# Patient Record
Sex: Female | Born: 1960 | Race: Black or African American | Hispanic: No | Marital: Married | Smoking: Former smoker
Health system: Southern US, Community
[De-identification: ages and names within clinical notes are randomized; demographics above are authoritative.]

## PROBLEM LIST (undated history)

## (undated) DIAGNOSIS — G709 Myoneural disorder, unspecified: Secondary | ICD-10-CM

## (undated) DIAGNOSIS — F319 Bipolar disorder, unspecified: Secondary | ICD-10-CM

## (undated) DIAGNOSIS — F32A Depression, unspecified: Secondary | ICD-10-CM

## (undated) DIAGNOSIS — F419 Anxiety disorder, unspecified: Secondary | ICD-10-CM

## (undated) DIAGNOSIS — I10 Essential (primary) hypertension: Secondary | ICD-10-CM

## (undated) DIAGNOSIS — F329 Major depressive disorder, single episode, unspecified: Secondary | ICD-10-CM

## (undated) HISTORY — PX: ABDOMINAL HYSTERECTOMY: SHX81

## (undated) HISTORY — PX: KNEE SURGERY: SHX244

## (undated) HISTORY — PX: BREAST SURGERY: SHX581

## (undated) HISTORY — PX: OVARIAN CYST REMOVAL: SHX89

## (undated) HISTORY — PX: FOOT SURGERY: SHX648

## (undated) HISTORY — PX: HAND TENDON SURGERY: SHX663

## (undated) HISTORY — PX: ECTOPIC PREGNANCY SURGERY: SHX613

---

## 2011-06-26 DIAGNOSIS — F418 Other specified anxiety disorders: Secondary | ICD-10-CM | POA: Insufficient documentation

## 2011-06-26 DIAGNOSIS — D179 Benign lipomatous neoplasm, unspecified: Secondary | ICD-10-CM | POA: Insufficient documentation

## 2012-04-22 DIAGNOSIS — N6320 Unspecified lump in the left breast, unspecified quadrant: Secondary | ICD-10-CM | POA: Insufficient documentation

## 2013-09-29 DIAGNOSIS — E669 Obesity, unspecified: Secondary | ICD-10-CM | POA: Insufficient documentation

## 2013-11-23 DIAGNOSIS — G5601 Carpal tunnel syndrome, right upper limb: Secondary | ICD-10-CM | POA: Insufficient documentation

## 2013-12-22 DIAGNOSIS — M51369 Other intervertebral disc degeneration, lumbar region without mention of lumbar back pain or lower extremity pain: Secondary | ICD-10-CM | POA: Insufficient documentation

## 2013-12-22 DIAGNOSIS — M5136 Other intervertebral disc degeneration, lumbar region: Secondary | ICD-10-CM | POA: Insufficient documentation

## 2014-05-03 DIAGNOSIS — K219 Gastro-esophageal reflux disease without esophagitis: Secondary | ICD-10-CM | POA: Insufficient documentation

## 2014-06-21 DIAGNOSIS — M65331 Trigger finger, right middle finger: Secondary | ICD-10-CM | POA: Insufficient documentation

## 2014-12-01 DIAGNOSIS — J301 Allergic rhinitis due to pollen: Secondary | ICD-10-CM | POA: Insufficient documentation

## 2015-01-09 DIAGNOSIS — F3170 Bipolar disorder, currently in remission, most recent episode unspecified: Secondary | ICD-10-CM | POA: Insufficient documentation

## 2016-05-13 DIAGNOSIS — G894 Chronic pain syndrome: Secondary | ICD-10-CM | POA: Insufficient documentation

## 2016-07-16 DIAGNOSIS — IMO0001 Reserved for inherently not codable concepts without codable children: Secondary | ICD-10-CM | POA: Insufficient documentation

## 2016-07-16 DIAGNOSIS — R911 Solitary pulmonary nodule: Secondary | ICD-10-CM | POA: Insufficient documentation

## 2016-11-21 DIAGNOSIS — F1910 Other psychoactive substance abuse, uncomplicated: Secondary | ICD-10-CM | POA: Insufficient documentation

## 2017-05-28 DIAGNOSIS — G4700 Insomnia, unspecified: Secondary | ICD-10-CM | POA: Insufficient documentation

## 2017-05-29 DIAGNOSIS — J302 Other seasonal allergic rhinitis: Secondary | ICD-10-CM | POA: Insufficient documentation

## 2017-06-11 DIAGNOSIS — K582 Mixed irritable bowel syndrome: Secondary | ICD-10-CM | POA: Insufficient documentation

## 2017-12-20 ENCOUNTER — Other Ambulatory Visit: Payer: Self-pay

## 2017-12-20 ENCOUNTER — Emergency Department
Admission: EM | Admit: 2017-12-20 | Discharge: 2017-12-20 | Disposition: A | Discharge status: Home / Self Care | Payer: Managed Care, Other (non HMO) | Attending: Emergency Medicine | Admitting: Emergency Medicine

## 2017-12-20 ENCOUNTER — Encounter: Payer: Self-pay | Admitting: Emergency Medicine

## 2017-12-20 DIAGNOSIS — S161XXA Strain of muscle, fascia and tendon at neck level, initial encounter: Secondary | ICD-10-CM

## 2017-12-20 DIAGNOSIS — Z79899 Other long term (current) drug therapy: Secondary | ICD-10-CM | POA: Diagnosis not present

## 2017-12-20 DIAGNOSIS — Y999 Unspecified external cause status: Secondary | ICD-10-CM | POA: Insufficient documentation

## 2017-12-20 DIAGNOSIS — R300 Dysuria: Secondary | ICD-10-CM

## 2017-12-20 DIAGNOSIS — M5431 Sciatica, right side: Secondary | ICD-10-CM

## 2017-12-20 DIAGNOSIS — Y92410 Unspecified street and highway as the place of occurrence of the external cause: Secondary | ICD-10-CM | POA: Diagnosis not present

## 2017-12-20 DIAGNOSIS — Y9389 Activity, other specified: Secondary | ICD-10-CM | POA: Diagnosis not present

## 2017-12-20 DIAGNOSIS — S199XXA Unspecified injury of neck, initial encounter: Secondary | ICD-10-CM | POA: Diagnosis present

## 2017-12-20 LAB — URINALYSIS, COMPLETE (UACMP) WITH MICROSCOPIC
BILIRUBIN URINE: NEGATIVE
GLUCOSE, UA: NEGATIVE mg/dL
Hgb urine dipstick: NEGATIVE
Ketones, ur: NEGATIVE mg/dL
LEUKOCYTES UA: NEGATIVE
NITRITE: POSITIVE — AB
Protein, ur: NEGATIVE mg/dL
SPECIFIC GRAVITY, URINE: 1.008 (ref 1.005–1.030)
pH: 6 (ref 5.0–8.0)

## 2017-12-20 LAB — WET PREP, GENITAL
Clue Cells Wet Prep HPF POC: NONE SEEN
SPERM: NONE SEEN
TRICH WET PREP: NONE SEEN
YEAST WET PREP: NONE SEEN

## 2017-12-20 LAB — CHLAMYDIA/NGC RT PCR (ARMC ONLY)
Chlamydia Tr: NOT DETECTED
N gonorrhoeae: NOT DETECTED

## 2017-12-20 LAB — POCT PREGNANCY, URINE: Preg Test, Ur: NEGATIVE

## 2017-12-20 MED ORDER — MELOXICAM 15 MG PO TABS: 15.0000 mg | ORAL_TABLET | Freq: Every day | ORAL | 0 refills | Status: DC

## 2017-12-20 MED ORDER — CYCLOBENZAPRINE HCL 10 MG PO TABS
10.0000 mg | ORAL_TABLET | Freq: Once | ORAL | Status: AC
  Administered 2017-12-20: 10 mg via ORAL
  Filled 2017-12-20: qty 1

## 2017-12-20 MED ORDER — CEPHALEXIN 500 MG PO CAPS: 500.0000 mg | ORAL_CAPSULE | Freq: Four times a day (QID) | ORAL | 0 refills | Status: AC

## 2017-12-20 MED ORDER — CYCLOBENZAPRINE HCL 5 MG PO TABS: 5.0000 mg | ORAL_TABLET | Freq: Three times a day (TID) | ORAL | 0 refills | Status: DC | PRN

## 2017-12-20 MED ORDER — SULFAMETHOXAZOLE-TRIMETHOPRIM 800-160 MG PO TABS: 1.0000 | ORAL_TABLET | Freq: Two times a day (BID) | ORAL | 0 refills | Status: DC

## 2017-12-20 MED ORDER — KETOROLAC TROMETHAMINE 30 MG/ML IJ SOLN
30.0000 mg | Freq: Once | INTRAMUSCULAR | Status: AC
  Administered 2017-12-20: 30 mg via INTRAMUSCULAR
  Filled 2017-12-20: qty 1
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
## 2017-12-20 NOTE — Discharge Instructions (Signed)
Please take medication as prescribed.  If any fevers, back pain, difficulty urinating, worsening symptoms or to changes in your health please return to the emergency department.
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
## 2017-12-22 LAB — URINE CULTURE: Culture: >=100000 — AB

## 2018-02-02 ENCOUNTER — Emergency Department
Admission: EM | Admit: 2018-02-02 | Discharge: 2018-02-02 | Disposition: A | Discharge status: Home / Self Care | Payer: 59 | Attending: Emergency Medicine | Admitting: Emergency Medicine

## 2018-02-02 ENCOUNTER — Encounter: Payer: Self-pay | Admitting: Emergency Medicine

## 2018-02-02 ENCOUNTER — Other Ambulatory Visit: Payer: Self-pay

## 2018-02-02 ENCOUNTER — Emergency Department: Payer: 59

## 2018-02-02 DIAGNOSIS — R1030 Lower abdominal pain, unspecified: Secondary | ICD-10-CM | POA: Diagnosis present

## 2018-02-02 DIAGNOSIS — Z79899 Other long term (current) drug therapy: Secondary | ICD-10-CM | POA: Diagnosis not present

## 2018-02-02 DIAGNOSIS — R197 Diarrhea, unspecified: Secondary | ICD-10-CM | POA: Insufficient documentation

## 2018-02-02 DIAGNOSIS — N3090 Cystitis, unspecified without hematuria: Secondary | ICD-10-CM | POA: Insufficient documentation

## 2018-02-02 LAB — CBC
HCT: 41.2 % (ref 36.0–46.0)
Hemoglobin: 13 g/dL (ref 12.0–15.0)
MCH: 28.3 pg (ref 26.0–34.0)
MCHC: 31.6 g/dL (ref 30.0–36.0)
MCV: 89.6 fL (ref 80.0–100.0)
PLATELETS: 329 10*3/uL (ref 150–400)
RBC: 4.6 MIL/uL (ref 3.87–5.11)
RDW: 12.9 % (ref 11.5–15.5)
WBC: 7.4 10*3/uL (ref 4.0–10.5)
nRBC: 0 % (ref 0.0–0.2)

## 2018-02-02 LAB — COMPREHENSIVE METABOLIC PANEL
ALBUMIN: 4.1 g/dL (ref 3.5–5.0)
ALT: 12 U/L (ref 0–44)
AST: 16 U/L (ref 15–41)
Alkaline Phosphatase: 64 U/L (ref 38–126)
Anion gap: 5 (ref 5–15)
BILIRUBIN TOTAL: 0.7 mg/dL (ref 0.3–1.2)
BUN: 11 mg/dL (ref 6–20)
CALCIUM: 9.5 mg/dL (ref 8.9–10.3)
CO2: 27 mmol/L (ref 22–32)
CREATININE: 1.08 mg/dL — AB (ref 0.44–1.00)
Chloride: 109 mmol/L (ref 98–111)
GFR calc Af Amer: >60 mL/min (ref >60)
GFR calc non Af Amer: 56 mL/min — ABNORMAL LOW (ref >60)
GLUCOSE: 109 mg/dL — AB (ref 70–99)
Potassium: 3.9 mmol/L (ref 3.5–5.1)
Sodium: 141 mmol/L (ref 135–145)
TOTAL PROTEIN: 7.4 g/dL (ref 6.5–8.1)

## 2018-02-02 LAB — GASTROINTESTINAL PANEL BY PCR, STOOL (REPLACES STOOL CULTURE)

## 2018-02-02 LAB — C DIFFICILE QUICK SCREEN W PCR REFLEX
C DIFFICILE (CDIFF) TOXIN: NEGATIVE
C Diff antigen: NEGATIVE
C Diff interpretation: NOT DETECTED

## 2018-02-02 LAB — URINALYSIS, COMPLETE (UACMP) WITH MICROSCOPIC
BILIRUBIN URINE: NEGATIVE
Bacteria, UA: NONE SEEN
Glucose, UA: NEGATIVE mg/dL
Hgb urine dipstick: NEGATIVE
KETONES UR: NEGATIVE mg/dL
NITRITE: NEGATIVE
PH: 8 (ref 5.0–8.0)
Protein, ur: NEGATIVE mg/dL
Specific Gravity, Urine: 1.009 (ref 1.005–1.030)

## 2018-02-02 LAB — LIPASE, BLOOD: Lipase: 31 U/L (ref 11–51)

## 2018-02-02 MED ORDER — LIDOCAINE-HYDROCORTISONE ACE 3-0.5 % RE CREA: 1.0000 | TOPICAL_CREAM | Freq: Two times a day (BID) | RECTAL | 0 refills | Status: DC

## 2018-02-02 MED ORDER — MORPHINE SULFATE (PF) 4 MG/ML IV SOLN
4.0000 mg | Freq: Once | INTRAVENOUS | Status: AC
  Administered 2018-02-02: 4 mg via INTRAVENOUS
  Filled 2018-02-02: qty 1

## 2018-02-02 MED ORDER — ONDANSETRON HCL 4 MG/2ML IJ SOLN
4.0000 mg | Freq: Once | INTRAMUSCULAR | Status: AC
  Administered 2018-02-02: 4 mg via INTRAVENOUS
  Filled 2018-02-02: qty 2

## 2018-02-02 MED ORDER — IOPAMIDOL (ISOVUE-300) INJECTION 61%
15.0000 mL | INTRAVENOUS | Status: AC
  Administered 2018-02-02 (×2): 15 mL via ORAL

## 2018-02-02 MED ORDER — KETOROLAC TROMETHAMINE 30 MG/ML IJ SOLN
30.0000 mg | Freq: Once | INTRAMUSCULAR | Status: AC
  Administered 2018-02-02: 30 mg via INTRAVENOUS
  Filled 2018-02-02: qty 1

## 2018-02-02 MED ORDER — IOPAMIDOL (ISOVUE-300) INJECTION 61%
100.0000 mL | Freq: Once | INTRAVENOUS | Status: AC | PRN
  Administered 2018-02-02: 100 mL via INTRAVENOUS

## 2018-02-02 MED ORDER — TRAMADOL HCL 50 MG PO TABS: 50.0000 mg | ORAL_TABLET | Freq: Four times a day (QID) | ORAL | 0 refills | Status: AC | PRN

## 2018-02-02 NOTE — ED Triage Notes (Signed)
Being treated for uti for a month.  Says her doctor in [place] called and told her she has ecoli.  Says she has been on amoxicillin and then on cipro and prior to that we had put her on cefalexin.  Says she feels bad all over.

## 2018-02-02 NOTE — ED Notes (Signed)
Dr. Cherylann Banas in room to assess patient.  Will continue to monitor.

## 2018-02-02 NOTE — ED Provider Notes (Signed)
[facility] Emergency Department Provider Note ____________________________________________   First MD Initiated Contact with Patient 02/02/18 1033     (approximate)  I have reviewed the triage vital signs and the nursing notes.   HISTORY  Chief Complaint Abdominal Pain; Back Pain; and Leg Pain    HPI Jennifer Welch is a 57 y.o. female with PMH as noted below who presents with a primary complaint of lower abdominal pain, occurring over the last several weeks, going across her whole lower abdomen and radiating to her back into her legs, and associated with diarrhea.  The patient also has been having dysuria and has been treated for an apparent UTI with several courses of antibiotics.  She was told that she had E. coli.  She is now concerned that it could be in the rest of her body or in her blood.  She denies fever, nausea or vomiting, chest pain or shortness of breath, or other acute symptoms.  She denies blood in the stool.   History reviewed. No pertinent past medical history.  There are no active problems to display for this patient.   Past Surgical History:  Procedure Laterality Date  . ABDOMINAL HYSTERECTOMY    . BREAST SURGERY    . ECTOPIC PREGNANCY SURGERY    . FOOT SURGERY    . HAND TENDON SURGERY    . KNEE SURGERY    . OVARIAN CYST REMOVAL      Prior to Admission medications   Medication Sig Start Date End Date Taking? Authorizing Provider  cetirizine (ZYRTEC) 10 MG tablet Take 10 mg by mouth daily.    [provider]  clonazePAM (KLONOPIN) 0.5 MG tablet Take 0.5 mg by mouth 3 (three) times daily as needed for anxiety.    [provider]  cyclobenzaprine (FLEXERIL) 5 MG tablet Take 1-2 tablets (5-10 mg total) by mouth 3 (three) times daily as needed for muscle spasms. 12/20/17   Duanne Guess, PA-C  escitalopram (LEXAPRO) 20 MG tablet Take 20 mg by mouth daily.    [provider]  fluticasone (FLONASE) 50  MCG/ACT nasal spray Place 1 spray into both nostrils daily.    [provider]  lidocaine-hydrocortisone (ANAMANTEL HC) 3-0.5 % CREA Place 1 Applicatorful rectally 2 (two) times daily. 02/02/18   Arta Silence, MD  meloxicam (MOBIC) 15 MG tablet Take 1 tablet (15 mg total) by mouth daily. 12/20/17   Duanne Guess, PA-C  OXcarbazepine (TRILEPTAL) 150 MG tablet Take 150 mg by mouth 2 (two) times daily.    [provider]  pantoprazole (PROTONIX) 40 MG tablet Take 40 mg by mouth daily.    [provider]  pregabalin (LYRICA) 150 MG capsule Take 150 mg by mouth 2 (two) times daily.    [provider]  sulfamethoxazole-trimethoprim (BACTRIM DS,SEPTRA DS) 800-160 MG tablet Take 1 tablet by mouth 2 (two) times daily. 12/20/17   Duanne Guess, PA-C  traMADol (ULTRAM) 50 MG tablet Take 1 tablet (50 mg total) by mouth every 6 (six) hours as needed for up to 5 days for moderate pain. 02/02/18 02/07/18  Arta Silence, MD    Allergies Macrobid [nitrofurantoin macrocrystal] and Septra [sulfamethoxazole-trimethoprim]  No family history on file.  Social History Social History   Tobacco Use  . Smoking status: Never Smoker  . Smokeless tobacco: Never Used  Substance Use Topics  . Alcohol use: Not Currently  . Drug use: Not on file    Review of Systems  Constitutional:  No fever. Eyes: No redness. ENT: No sore throat. Cardiovascular: Denies chest pain. Respiratory: Denies shortness of breath. Gastrointestinal: No vomiting.  Positive for diarrhea.  Genitourinary: Positive for dysuria.  Musculoskeletal: Negative for back pain. Skin: Negative for rash. Neurological: Negative for headache.   ____________________________________________   PHYSICAL EXAM:  VITAL SIGNS: ED Triage Vitals  Enc Vitals Group     BP 02/02/18 0929 118/73     Pulse Rate 02/02/18 0929 77     Resp 02/02/18 0929 16     Temp 02/02/18 0929 98.3 F (36.8 C)     Temp  Source 02/02/18 0929 Oral     SpO2 02/02/18 0929 98 %     Weight 02/02/18 0930 194 lb (88 kg)     Height 02/02/18 0930 5\' 7"  (1.702 m)     Head Circumference --      Peak Flow --      Pain Score 02/02/18 0930 10     Pain Loc --      Pain Edu? --      Excl. in [employer]? --     Constitutional: Alert and oriented.  Relatively well appearing and in no acute distress. Eyes: Conjunctivae are normal.  No scleral icterus. Head: Atraumatic. Nose: No congestion/rhinnorhea. Mouth/Throat: Mucous membranes are moist.   Neck: Normal range of motion.  Cardiovascular: Good peripheral circulation. Respiratory: Normal respiratory effort.  Gastrointestinal: Soft with mild bilateral lower quadrant abdominal tenderness. No distention.  Small external hemorrhoids with no bleeding. Genitourinary: No flank tenderness. Musculoskeletal: No lower extremity edema.  Extremities warm and well perfused.  Neurologic:  Normal speech and language. No gross focal neurologic deficits are appreciated.  Skin:  Skin is warm and dry. No rash noted. Psychiatric: Mood and affect are normal. Speech and behavior are normal.  ____________________________________________   LABS (all labs ordered are listed, but only abnormal results are displayed)  Labs Reviewed  COMPREHENSIVE METABOLIC PANEL - Abnormal; Notable for the following components:      Result Value   Glucose, Bld 109 (*)    Creatinine, Ser 1.08 (*)    GFR calc non Af Amer 56 (*)    All other components within normal limits  URINALYSIS, COMPLETE (UACMP) WITH MICROSCOPIC - Abnormal; Notable for the following components:   Color, Urine STRAW (*)    APPearance CLEAR (*)    Leukocytes, UA TRACE (*)    All other components within normal limits  C DIFFICILE QUICK SCREEN W PCR REFLEX  GASTROINTESTINAL PANEL BY PCR, STOOL (REPLACES STOOL CULTURE)  LIPASE, BLOOD  CBC    ____________________________________________  EKG   ____________________________________________  RADIOLOGY  CT abdomen: No colitis or other acute abnormalities  ____________________________________________   PROCEDURES  Procedure(s) performed: No  Procedures  Critical Care performed: No ____________________________________________   INITIAL IMPRESSION / ASSESSMENT AND PLAN / ED COURSE  Pertinent labs & imaging results that were available during my care of the patient were reviewed by me and considered in my medical decision making (see chart for details).  57 year old female with PMH as noted above presents with multiple complaints, but primarily for lower abdominal pain which radiates to her lower back and legs, diarrhea, and some dysuria.  The patient was seen in the ED on 12/20/2017 and initially diagnosed with UTI and sciatica.  She was started on Bactrim at that time.  She states she subsequently followed up with her doctor with continued symptoms and was placed on amoxicillin, but then once the cultures returned she was  changed to Cipro but 3 days ago.  She states that the dysuria has improved but the pain is persistent.  On exam she is well-appearing.  Her vital signs are normal.  The remainder of the exam is as described above, with some mild lower abdominal tenderness.  Overall her presentation is more consistent with colitis or enteritis than persistent urinary tract disease.  Her initial labs are all within normal limits and there is no evidence of systemic infection or sepsis.  Given the diarrhea, recent multiple antibiotic courses and abdominal tenderness, I will obtain C. difficile and GI panel from the stool, and CT abdomen to rule out colitis.  I anticipate discharge home if the work-up is negative.  ----------------------------------------- 3:38 PM on 02/02/2018 -----------------------------------------  The lab work-up is unremarkable.  The UA is  essentially negative, and I suspect that the patient's UTI is resolving with the Cipro she is taking currently.  Stool studies and C. difficile are also both negative.  CT showed no colitis or other acute findings.  There were very small pulmonary nodules but the patient is low risk and has no history of primary disease so she does not need any specific follow-up per the radiology recommendations.  I counseled the patient on the results of the work-up.  I suspect that she is either having pain related to the resolving cystitis, or she is having diarrhea and cramping related to the antibiotics she has been on.  She is stable for discharge at this time.  Return precautions given, and she expresses understanding.  I instructed her to continue the Cipro and finish the course, and I prescribed her tramadol for pain as well as lidocaine for her hemorrhoids. ____________________________________________   FINAL CLINICAL IMPRESSION(S) / ED DIAGNOSES  Final diagnoses:  Cystitis  Diarrhea, unspecified type      NEW MEDICATIONS STARTED DURING THIS VISIT:  New Prescriptions   LIDOCAINE-HYDROCORTISONE (ANAMANTEL HC) 3-0.5 % CREA    Place 1 Applicatorful rectally 2 (two) times daily.   TRAMADOL (ULTRAM) 50 MG TABLET    Take 1 tablet (50 mg total) by mouth every 6 (six) hours as needed for up to 5 days for moderate pain.     Note:  This document was prepared using Dragon voice recognition software and may include unintentional dictation errors.    Arta Silence, MD 02/02/18 [id number]

## 2018-02-02 NOTE — Discharge Instructions (Signed)
Continue the Cipro that you are already taking and finish the full course.  Follow-up with your doctor as instructed.  You can use the cream provided to relieve the hemorrhoid pain.  You can take the tramadol as needed for abdominal or back pain.  Return to the ER for new, worsening, persistent severe pain, weakness or numbness, vomiting, blood in the stool, fevers, or any other new or worsening symptoms that concern you.

## 2018-02-02 NOTE — ED Notes (Signed)
Patient states she has had pain to the posterior sides of her upper legs/thighs x 1 week.  Patient states she has had some pain x 1 month but the week has been severe.  Patient states she started a course of cipro on the 15th.  Patient is in no obvious distress at this time.  Patient eating lollipop during assessment.  Patient is requesting pain medication at this time.

## 2018-02-02 NOTE — ED Notes (Signed)
Patient to Room 11 via [facility], Vicente Males RN aware of room placement.

## 2018-03-06 ENCOUNTER — Emergency Department: Payer: 59

## 2018-03-06 ENCOUNTER — Encounter: Payer: Self-pay | Admitting: Emergency Medicine

## 2018-03-06 ENCOUNTER — Other Ambulatory Visit: Payer: Self-pay

## 2018-03-06 ENCOUNTER — Emergency Department
Admission: EM | Admit: 2018-03-06 | Discharge: 2018-03-06 | Disposition: A | Discharge status: Home / Self Care | Payer: 59 | Attending: Emergency Medicine | Admitting: Emergency Medicine

## 2018-03-06 DIAGNOSIS — Z79899 Other long term (current) drug therapy: Secondary | ICD-10-CM | POA: Insufficient documentation

## 2018-03-06 DIAGNOSIS — R350 Frequency of micturition: Secondary | ICD-10-CM | POA: Diagnosis present

## 2018-03-06 DIAGNOSIS — J069 Acute upper respiratory infection, unspecified: Secondary | ICD-10-CM | POA: Insufficient documentation

## 2018-03-06 LAB — COMPREHENSIVE METABOLIC PANEL
ALK PHOS: 54 U/L (ref 38–126)
ALT: 12 U/L (ref 0–44)
AST: 19 U/L (ref 15–41)
Albumin: 4.3 g/dL (ref 3.5–5.0)
Anion gap: 9 (ref 5–15)
BUN: 8 mg/dL (ref 6–20)
CALCIUM: 9.7 mg/dL (ref 8.9–10.3)
CO2: 28 mmol/L (ref 22–32)
CREATININE: 0.97 mg/dL (ref 0.44–1.00)
Chloride: 104 mmol/L (ref 98–111)
GFR calc non Af Amer: >60 mL/min (ref >60)
GLUCOSE: 91 mg/dL (ref 70–99)
Potassium: 3.4 mmol/L — ABNORMAL LOW (ref 3.5–5.1)
SODIUM: 141 mmol/L (ref 135–145)
Total Bilirubin: 0.8 mg/dL (ref 0.3–1.2)
Total Protein: 7.8 g/dL (ref 6.5–8.1)

## 2018-03-06 LAB — CBC
HCT: 38.1 % (ref 36.0–46.0)
HEMOGLOBIN: 12.2 g/dL (ref 12.0–15.0)
MCH: 28.3 pg (ref 26.0–34.0)
MCHC: 32 g/dL (ref 30.0–36.0)
MCV: 88.4 fL (ref 80.0–100.0)
Platelets: 296 10*3/uL (ref 150–400)
RBC: 4.31 MIL/uL (ref 3.87–5.11)
RDW: 13 % (ref 11.5–15.5)
WBC: 6 10*3/uL (ref 4.0–10.5)
nRBC: 0 % (ref 0.0–0.2)

## 2018-03-06 LAB — URINALYSIS, ROUTINE W REFLEX MICROSCOPIC
Bacteria, UA: NONE SEEN
Bilirubin Urine: NEGATIVE
GLUCOSE, UA: NEGATIVE mg/dL
Hgb urine dipstick: NEGATIVE
Ketones, ur: NEGATIVE mg/dL
Nitrite: NEGATIVE
PROTEIN: NEGATIVE mg/dL
Specific Gravity, Urine: 1.008 (ref 1.005–1.030)
pH: 6 (ref 5.0–8.0)

## 2018-03-06 MED ORDER — PHENAZOPYRIDINE HCL 200 MG PO TABS: 200.0000 mg | ORAL_TABLET | Freq: Three times a day (TID) | ORAL | 0 refills | Status: DC | PRN

## 2018-03-06 NOTE — ED Triage Notes (Signed)
Pt to ed with c/o urinary frequency burning and pain.  Pt states hx of frequent UTIs.  Pt also reports left lower abd pain in addition to decreased appetite, burning in vaginal area.  Pt also c/o cough and congestion x several days.

## 2018-03-06 NOTE — ED Provider Notes (Signed)
[facility] Emergency Department Provider Note  Time seen: 10:44 AM  I have reviewed the triage vital signs and the nursing notes.   HISTORY  Chief Complaint Urinary Frequency    HPI Analyah Welch is a 57 y.o. female who presents to the emergency department with complaints of dysuria, frequency left lower quadrant abdominal pain, cough and congestion.  According to the patient since October she has been experiencing intermittent left lower quadrant abdominal pain, intermittent dysuria and burning.  States she was diagnosed with urinary tract infection back in October was placed on antibiotics.  Felt like the urinary tract infection came back so she went to her doctor 11/28 and had a culture performed showing no infection.  Patient states recently she had sex with her husband and forgot to urinate afterwards and since then has been experiencing burning upon urination once again with continued intermittent lower abdominal pain.  She also states for the past several weeks she has been coughing with congestion although denies any fever.  Also feels like she could be dehydrated.   History reviewed. No pertinent past medical history.  There are no active problems to display for this patient.   Past Surgical History:  Procedure Laterality Date  . ABDOMINAL HYSTERECTOMY    . BREAST SURGERY    . ECTOPIC PREGNANCY SURGERY    . FOOT SURGERY    . HAND TENDON SURGERY    . KNEE SURGERY    . OVARIAN CYST REMOVAL      Prior to Admission medications   Medication Sig Start Date End Date Taking? Authorizing Provider  cetirizine (ZYRTEC) 10 MG tablet Take 10 mg by mouth daily.    [provider]  clonazePAM (KLONOPIN) 0.5 MG tablet Take 0.5 mg by mouth 3 (three) times daily as needed for anxiety.    [provider]  cyclobenzaprine (FLEXERIL) 5 MG tablet Take 1-2 tablets (5-10 mg total) by mouth 3 (three) times daily as needed for muscle spasms. 12/20/17    Duanne Guess, PA-C  escitalopram (LEXAPRO) 20 MG tablet Take 20 mg by mouth daily.    [provider]  fluticasone (FLONASE) 50 MCG/ACT nasal spray Place 1 spray into both nostrils daily.    [provider]  lidocaine-hydrocortisone (ANAMANTEL HC) 3-0.5 % CREA Place 1 Applicatorful rectally 2 (two) times daily. 02/02/18   Arta Silence, MD  meloxicam (MOBIC) 15 MG tablet Take 1 tablet (15 mg total) by mouth daily. 12/20/17   Duanne Guess, PA-C  OXcarbazepine (TRILEPTAL) 150 MG tablet Take 150 mg by mouth 2 (two) times daily.    [provider]  pantoprazole (PROTONIX) 40 MG tablet Take 40 mg by mouth daily.    [provider]  pregabalin (LYRICA) 150 MG capsule Take 150 mg by mouth 2 (two) times daily.    [provider]  sulfamethoxazole-trimethoprim (BACTRIM DS,SEPTRA DS) 800-160 MG tablet Take 1 tablet by mouth 2 (two) times daily. 12/20/17   Duanne Guess, PA-C    Allergies  Allergen Reactions  . Macrobid [Nitrofurantoin Macrocrystal] Rash  . Septra [Sulfamethoxazole-Trimethoprim] Rash    No family history on file.  Social History Social History   Tobacco Use  . Smoking status: Never Smoker  . Smokeless tobacco: Never Used  Substance Use Topics  . Alcohol use: Not Currently  . Drug use: Never    Review of Systems Constitutional: Negative for fever ENT: Positive for cough/congestion Cardiovascular: Negative for chest pain. Respiratory: Negative for shortness of  breath.  Positive for cough Gastrointestinal: Intermittent lower abdominal pain.  Negative for vomiting or diarrhea Genitourinary: Dysuria and frequency Musculoskeletal: Negative for musculoskeletal complaints Skin: Negative for skin complaints  Neurological: Negative for headache All other ROS negative  ____________________________________________   PHYSICAL EXAM:  VITAL SIGNS: ED Triage Vitals  Enc Vitals Group     BP 03/06/18 1012 (!) 143/75      Pulse Rate 03/06/18 1012 75     Resp 03/06/18 1012 18     Temp 03/06/18 1012 98.7 F (37.1 C)     Temp Source 03/06/18 1012 Oral     SpO2 03/06/18 1012 99 %     Weight 03/06/18 1013 186 lb (84.4 kg)     Height 03/06/18 1013 5\' 7"  (1.702 m)     Head Circumference --      Peak Flow --      Pain Score 03/06/18 1012 10     Pain Loc --      Pain Edu? --      Excl. in [place]? --     Constitutional: Alert and oriented. Well appearing and in no distress. Eyes: Normal exam ENT   Head: Normocephalic and atraumatic.   Mouth/Throat: Mucous membranes are moist.  Mild congestion. Cardiovascular: Normal rate, regular rhythm. No murmur Respiratory: Normal respiratory effort without tachypnea nor retractions. Breath sounds are clear  Gastrointestinal: Soft, minimal lower abdominal tenderness to palpation, no rebound guarding or distention Musculoskeletal: Nontender with normal range of motion in all extremities.  Neurologic:  Normal speech and language. No gross focal neurologic deficits Skin:  Skin is warm, dry and intact.  Psychiatric: Mood and affect are normal.   ____________________________________________   RADIOLOGY  Chest x-ray is negative  ____________________________________________   INITIAL IMPRESSION / ASSESSMENT AND PLAN / ED COURSE  Pertinent labs & imaging results that were available during my care of the patient were reviewed by me and considered in my medical decision making (see chart for details).  Patient presents emergency department for multiple complaints including cough congestion times several weeks also intermittent dysuria and frequency along with intermittent lower abdominal pain which has been ongoing since October.  Denies any fever.  Patient was seen in [place] diagnosed with urinary tract infection.  Also had a pelvic exam performed with negative STD testing.  Denies being at risk for STD sexually active with her husband only.  Overall the  patient appears very well, minimal suprapubic tenderness palpation otherwise benign abdomen.  Occasional cough with minimal congestion.  We will check labs and a chest x-ray as a precaution.  We will check a urinalysis and continue to closely monitor.  Chest x-ray is negative.  Labs are normal.  Urinalysis is negative.  Urine culture has been added on as a precaution.  We will discharge the patient home with PCP follow-up.  Patient agreeable to plan of care.  ____________________________________________   FINAL CLINICAL IMPRESSION(S) / ED DIAGNOSES  Upper respiratory infection    Harvest Dark, MD 03/06/18 1244

## 2018-03-06 NOTE — ED Notes (Signed)
Pt unable to provide urine sample at this time. Pt aware that urine sample is needed.

## 2018-03-07 LAB — URINE CULTURE

## 2018-04-06 ENCOUNTER — Other Ambulatory Visit: Payer: Self-pay

## 2018-04-06 ENCOUNTER — Emergency Department
Admission: EM | Admit: 2018-04-06 | Discharge: 2018-04-06 | Disposition: A | Discharge status: Home / Self Care | Payer: 59 | Attending: Emergency Medicine | Admitting: Emergency Medicine

## 2018-04-06 ENCOUNTER — Encounter: Payer: Self-pay | Admitting: *Deleted

## 2018-04-06 DIAGNOSIS — M545 Low back pain, unspecified: Secondary | ICD-10-CM

## 2018-04-06 DIAGNOSIS — N3090 Cystitis, unspecified without hematuria: Secondary | ICD-10-CM | POA: Insufficient documentation

## 2018-04-06 DIAGNOSIS — Z79899 Other long term (current) drug therapy: Secondary | ICD-10-CM | POA: Diagnosis not present

## 2018-04-06 DIAGNOSIS — R300 Dysuria: Secondary | ICD-10-CM | POA: Diagnosis present

## 2018-04-06 LAB — COMPREHENSIVE METABOLIC PANEL
ALBUMIN: 4.4 g/dL (ref 3.5–5.0)
ALK PHOS: 57 U/L (ref 38–126)
ALT: 12 U/L (ref 0–44)
AST: 17 U/L (ref 15–41)
Anion gap: 7 (ref 5–15)
BILIRUBIN TOTAL: 0.8 mg/dL (ref 0.3–1.2)
BUN: 11 mg/dL (ref 6–20)
CALCIUM: 9.4 mg/dL (ref 8.9–10.3)
CO2: 25 mmol/L (ref 22–32)
CREATININE: 0.85 mg/dL (ref 0.44–1.00)
Chloride: 107 mmol/L (ref 98–111)
GFR calc Af Amer: >60 mL/min (ref >60)
GFR calc non Af Amer: >60 mL/min (ref >60)
Glucose, Bld: 101 mg/dL — ABNORMAL HIGH (ref 70–99)
Potassium: 3.7 mmol/L (ref 3.5–5.1)
Sodium: 139 mmol/L (ref 135–145)
TOTAL PROTEIN: 7.6 g/dL (ref 6.5–8.1)

## 2018-04-06 LAB — CBC
HCT: 36.3 % (ref 36.0–46.0)
Hemoglobin: 11.8 g/dL — ABNORMAL LOW (ref 12.0–15.0)
MCH: 28.2 pg (ref 26.0–34.0)
MCHC: 32.5 g/dL (ref 30.0–36.0)
MCV: 86.6 fL (ref 80.0–100.0)
PLATELETS: 297 10*3/uL (ref 150–400)
RBC: 4.19 MIL/uL (ref 3.87–5.11)
RDW: 13.2 % (ref 11.5–15.5)
WBC: 6.4 10*3/uL (ref 4.0–10.5)
nRBC: 0 % (ref 0.0–0.2)

## 2018-04-06 LAB — URINALYSIS, COMPLETE (UACMP) WITH MICROSCOPIC
Bilirubin Urine: NEGATIVE
GLUCOSE, UA: NEGATIVE mg/dL
Hgb urine dipstick: NEGATIVE
Ketones, ur: NEGATIVE mg/dL
Leukocytes, UA: NEGATIVE
Nitrite: NEGATIVE
Protein, ur: NEGATIVE mg/dL
SPECIFIC GRAVITY, URINE: 1.01 (ref 1.005–1.030)
pH: 8 (ref 5.0–8.0)

## 2018-04-06 LAB — LIPASE, BLOOD: Lipase: 31 U/L (ref 11–51)

## 2018-04-06 MED ORDER — SODIUM CHLORIDE 0.9% FLUSH: 3.0000 mL | Freq: Once | INTRAVENOUS | Status: DC

## 2018-04-06 MED ORDER — NAPROXEN 500 MG PO TABS: 500.0000 mg | ORAL_TABLET | Freq: Two times a day (BID) | ORAL | 2 refills | Status: DC

## 2018-04-06 MED ORDER — KETOROLAC TROMETHAMINE 30 MG/ML IJ SOLN
30.0000 mg | Freq: Once | INTRAMUSCULAR | Status: AC
  Administered 2018-04-06: 30 mg via INTRAVENOUS
  Filled 2018-04-06: qty 1

## 2018-04-06 NOTE — ED Provider Notes (Signed)
[facility] Emergency Department Provider Note   ____________________________________________    I have reviewed the triage vital signs and the nursing notes.   HISTORY  Chief Complaint Abdominal Pain     HPI Jennifer Welch is a 58 y.o. female who presents with complaints of mild dysuria, right low back pain and cramping.  Patient states she recently had a cystoscopy, confirmed this via medical review.  Apparently this was done because of a split stream, urologist notes no urethral strictures, some dilation was performed, normal bladder overall.  Patient has had repeat urinary tract infections over the last several months.  Denies fevers or chills.  Suspect that she has another urinary tract infection, she is frustrated.  She is not currently on antibiotics  History reviewed. No pertinent past medical history.  There are no active problems to display for this patient.   Past Surgical History:  Procedure Laterality Date  . ABDOMINAL HYSTERECTOMY    . BREAST SURGERY    . ECTOPIC PREGNANCY SURGERY    . FOOT SURGERY    . HAND TENDON SURGERY    . KNEE SURGERY    . OVARIAN CYST REMOVAL      Prior to Admission medications   Medication Sig Start Date End Date Taking? Authorizing Provider  cetirizine (ZYRTEC) 10 MG tablet Take 10 mg by mouth daily.    [provider]  clonazePAM (KLONOPIN) 0.5 MG tablet Take 0.5 mg by mouth 3 (three) times daily as needed for anxiety.    [provider]  cyclobenzaprine (FLEXERIL) 5 MG tablet Take 1-2 tablets (5-10 mg total) by mouth 3 (three) times daily as needed for muscle spasms. 12/20/17   Duanne Guess, PA-C  escitalopram (LEXAPRO) 20 MG tablet Take 20 mg by mouth daily.    [provider]  fluticasone (FLONASE) 50 MCG/ACT nasal spray Place 1 spray into both nostrils daily.    [provider]  lidocaine-hydrocortisone (ANAMANTEL HC) 3-0.5 % CREA Place 1 Applicatorful rectally 2  (two) times daily. 02/02/18   Arta Silence, MD  meloxicam (MOBIC) 15 MG tablet Take 1 tablet (15 mg total) by mouth daily. 12/20/17   Duanne Guess, PA-C  naproxen (NAPROSYN) 500 MG tablet Take 1 tablet (500 mg total) by mouth 2 (two) times daily with a meal. 04/06/18   Lavonia Drafts, MD  OXcarbazepine (TRILEPTAL) 150 MG tablet Take 150 mg by mouth 2 (two) times daily.    [provider]  pantoprazole (PROTONIX) 40 MG tablet Take 40 mg by mouth daily.    [provider]  phenazopyridine (PYRIDIUM) 200 MG tablet Take 1 tablet (200 mg total) by mouth 3 (three) times daily as needed for pain. 03/06/18 03/06/19  Harvest Dark, MD  pregabalin (LYRICA) 150 MG capsule Take 150 mg by mouth 2 (two) times daily.    [provider]  sulfamethoxazole-trimethoprim (BACTRIM DS,SEPTRA DS) 800-160 MG tablet Take 1 tablet by mouth 2 (two) times daily. 12/20/17   Duanne Guess, PA-C     Allergies Macrobid [nitrofurantoin macrocrystal] and Septra [sulfamethoxazole-trimethoprim]  History reviewed. No pertinent family history.  Social History Social History   Tobacco Use  . Smoking status: Never Smoker  . Smokeless tobacco: Never Used  Substance Use Topics  . Alcohol use: Not Currently  . Drug use: Never    Review of Systems  Constitutional: No fever/chills Eyes: No visual changes.  ENT: No sore throat. Cardiovascular: Denies chest pain. Respiratory: Denies shortness of breath. Gastrointestinal:  As above Genitourinary: As above Musculoskeletal: As above Skin: Negative for rash. Neurological: Negative for headaches    ____________________________________________   PHYSICAL EXAM:  VITAL SIGNS: ED Triage Vitals  Enc Vitals Group     BP 04/06/18 1116 (!) 144/83     Pulse Rate 04/06/18 1116 74     Resp --      Temp 04/06/18 1116 98.6 F (37 C)     Temp Source 04/06/18 1116 Oral     SpO2 04/06/18 1116 98 %     Weight 04/06/18 1116 84.4 kg (186  lb)     Height 04/06/18 1116 1.715 m (5' 7.5")     Head Circumference --      Peak Flow --      Pain Score 04/06/18 1125 10     Pain Loc --      Pain Edu? --      Excl. in [place]? --     Constitutional: Alert and oriented. No acute distress.  Anxious    Mouth/Throat: Mucous membranes are moist.    Cardiovascular: Normal rate, regular rhythm. Grossly normal heart sounds.  Good peripheral circulation. Respiratory: Normal respiratory effort.  No retractions. Lungs CTAB. Gastrointestinal: Soft and nontender. No distention.  No CVA tenderness. Genitourinary: deferred Musculoskeletal: Back: Mild right lower lumbar paraspinal tenderness to palpation Neurologic:  No gross focal neurologic deficits are appreciated.  Skin:  Skin is warm, dry and intact. No rash noted. Psychiatric: Mood and affect are normal. Speech and behavior are normal.  ____________________________________________   LABS (all labs ordered are listed, but only abnormal results are displayed)  Labs Reviewed  COMPREHENSIVE METABOLIC PANEL - Abnormal; Notable for the following components:      Result Value   Glucose, Bld 101 (*)    All other components within normal limits  CBC - Abnormal; Notable for the following components:   Hemoglobin 11.8 (*)    All other components within normal limits  URINALYSIS, COMPLETE (UACMP) WITH MICROSCOPIC - Abnormal; Notable for the following components:   Color, Urine STRAW (*)    APPearance CLEAR (*)    Bacteria, UA MANY (*)    All other components within normal limits  LIPASE, BLOOD   ____________________________________________  EKG  None ____________________________________________  RADIOLOGY  None, patient had normal CT scan November 2019 ____________________________________________   PROCEDURES  Procedure(s) performed: No  Procedures   Critical Care performed: No ____________________________________________   INITIAL IMPRESSION / ASSESSMENT AND PLAN / ED  COURSE  Pertinent labs & imaging results that were available during my care of the patient were reviewed by me and considered in my medical decision making (see chart for details).  Patient overall quite well-appearing in no acute distress although somewhat anxious about continued discomfort and possible repeat urinary tract infection.  Urinalysis demonstrates many bacteria although no nitrites or leuks.  Will review prior urinary cultures  Patient improved after Toradol, will have her follow-up with her urologist, pending urine culture, no antibiotics for now.    ____________________________________________   FINAL CLINICAL IMPRESSION(S) / ED DIAGNOSES  Final diagnoses:  Cystitis  Acute right-sided low back pain without sciatica        Note:  This document was prepared using Dragon voice recognition software and may include unintentional dictation errors.   Lavonia Drafts, MD 04/06/18 [phone]

## 2018-04-06 NOTE — ED Triage Notes (Signed)
First Nurse Note:  C/O Lower abdominal and lower back pain x 5 days.  Had a recent urological procedure done last week.

## 2018-04-06 NOTE — ED Triage Notes (Signed)
Pt to ED reporting lower abd pain and bladder pain that worsened last night and started to shoot through her back. PT reporting what felt like palpitations on the right side of her chest that have since passed. No blood noted in urine but dysuria continues today. Pt has a hernia x 10 years she also reports pain with that and BMs.    Pt had her bladder flushed last week because of Ecoli and she is concerned it did not completely rid her bladder of infection.

## 2018-05-30 ENCOUNTER — Emergency Department
Admission: EM | Admit: 2018-05-30 | Discharge: 2018-05-31 | Disposition: A | Discharge status: Home / Self Care | Payer: 59 | Attending: Emergency Medicine | Admitting: Emergency Medicine

## 2018-05-30 ENCOUNTER — Emergency Department: Payer: 59

## 2018-05-30 ENCOUNTER — Other Ambulatory Visit: Payer: Self-pay

## 2018-05-30 DIAGNOSIS — R202 Paresthesia of skin: Secondary | ICD-10-CM | POA: Diagnosis present

## 2018-05-30 DIAGNOSIS — Z79899 Other long term (current) drug therapy: Secondary | ICD-10-CM | POA: Diagnosis not present

## 2018-05-30 DIAGNOSIS — R030 Elevated blood-pressure reading, without diagnosis of hypertension: Secondary | ICD-10-CM | POA: Diagnosis not present

## 2018-05-30 DIAGNOSIS — R42 Dizziness and giddiness: Secondary | ICD-10-CM | POA: Insufficient documentation

## 2018-05-30 DIAGNOSIS — R51 Headache: Secondary | ICD-10-CM | POA: Insufficient documentation

## 2018-05-30 LAB — COMPREHENSIVE METABOLIC PANEL
ALT: 17 U/L (ref 0–44)
AST: 26 U/L (ref 15–41)
Albumin: 4.9 g/dL (ref 3.5–5.0)
Alkaline Phosphatase: 57 U/L (ref 38–126)
Anion gap: 8 (ref 5–15)
BUN: 10 mg/dL (ref 6–20)
CHLORIDE: 106 mmol/L (ref 98–111)
CO2: 28 mmol/L (ref 22–32)
Calcium: 9.8 mg/dL (ref 8.9–10.3)
Creatinine, Ser: 0.94 mg/dL (ref 0.44–1.00)
GFR calc Af Amer: >60 mL/min (ref >60)
GFR calc non Af Amer: >60 mL/min (ref >60)
Glucose, Bld: 88 mg/dL (ref 70–99)
Potassium: 3.6 mmol/L (ref 3.5–5.1)
Sodium: 142 mmol/L (ref 135–145)
Total Bilirubin: 0.6 mg/dL (ref 0.3–1.2)
Total Protein: 8.4 g/dL — ABNORMAL HIGH (ref 6.5–8.1)

## 2018-05-30 LAB — DIFFERENTIAL
ABS IMMATURE GRANULOCYTES: 0.02 10*3/uL (ref 0.00–0.07)
Basophils Absolute: 0.1 10*3/uL (ref 0.0–0.1)
Basophils Relative: 1 %
Eosinophils Absolute: 0.4 10*3/uL (ref 0.0–0.5)
Eosinophils Relative: 6 %
Immature Granulocytes: 0 %
Lymphocytes Relative: 36 %
Lymphs Abs: 2.3 10*3/uL (ref 0.7–4.0)
Monocytes Absolute: 0.5 10*3/uL (ref 0.1–1.0)
Monocytes Relative: 8 %
Neutro Abs: 3.1 10*3/uL (ref 1.7–7.7)
Neutrophils Relative %: 49 %

## 2018-05-30 LAB — CBC
HCT: 38.7 % (ref 36.0–46.0)
Hemoglobin: 12.6 g/dL (ref 12.0–15.0)
MCH: 28.7 pg (ref 26.0–34.0)
MCHC: 32.6 g/dL (ref 30.0–36.0)
MCV: 88.2 fL (ref 80.0–100.0)
Platelets: 311 10*3/uL (ref 150–400)
RBC: 4.39 MIL/uL (ref 3.87–5.11)
RDW: 13.2 % (ref 11.5–15.5)
WBC: 6.4 10*3/uL (ref 4.0–10.5)
nRBC: 0 % (ref 0.0–0.2)

## 2018-05-30 LAB — PROTIME-INR
INR: 0.9 (ref 0.8–1.2)
Prothrombin Time: 12.4 seconds (ref 11.4–15.2)

## 2018-05-30 LAB — APTT: aPTT: 30 seconds (ref 24–36)

## 2018-05-30 LAB — ETHANOL: Alcohol, Ethyl (B): <10 mg/dL (ref <10)

## 2018-05-30 MED ORDER — ACETAMINOPHEN 500 MG PO TABS
1000.0000 mg | ORAL_TABLET | Freq: Once | ORAL | Status: AC
  Administered 2018-05-30: 1000 mg via ORAL
  Filled 2018-05-30: qty 2

## 2018-05-30 MED ORDER — PROCHLORPERAZINE EDISYLATE 10 MG/2ML IJ SOLN
10.0000 mg | Freq: Once | INTRAMUSCULAR | Status: AC
  Administered 2018-05-30: 10 mg via INTRAVENOUS
  Filled 2018-05-30: qty 2

## 2018-05-30 MED ORDER — DIPHENHYDRAMINE HCL 50 MG/ML IJ SOLN
12.5000 mg | Freq: Once | INTRAMUSCULAR | Status: AC
  Administered 2018-05-30: 12.5 mg via INTRAVENOUS
  Filled 2018-05-30: qty 1

## 2018-05-30 NOTE — ED Notes (Signed)
Pt to mri 

## 2018-05-30 NOTE — ED Notes (Signed)
Pt states she had been checking her bp today multiple times all in the 190's/..   So she went to the urgent care and was sent here. States her lips became numb, then her arm/ hand became numb and then her leg. Pt is able to move with no deficiets, speaks in complete sentences. Was able to recite all her meds clearly. Pt went to ct prior to coming to room 12.

## 2018-05-30 NOTE — ED Notes (Signed)
Provider at bedside for update

## 2018-05-30 NOTE — ED Triage Notes (Signed)
Patient reports symptoms began at noon today.  States started with lip numbness, then finger numbness and right leg numbness.  Reports work noted her blood pressure was elevated.

## 2018-05-30 NOTE — ED Provider Notes (Signed)
-----------------------------------------   11:59 PM on 05/30/2018 -----------------------------------------  Assuming care from Dr. Quentin Cornwall (by way of Dr. Joni Fears).  In [name], Jennifer Welch is a 58 y.o. female with a chief complaint of numbness and hypertension.  Refer to the original H&P for additional details.  The patient's MRI brain and MR cervical spine did not show any acute findings.  The patient is in no distress and has been resting comfortably and quietly.  I updated the patient and her family regarding the results.  They all want to go home and acknowledge the need to follow-up with her primary care doctor this coming week.  The last thing pending is a urinalysis and the patient has provided a urine specimen.  This ends the results of the urinalysis are back I will discharge for outpatient follow-up with antibiotics if appropriate.  I gave my usual and customary return precautions as well as discussed with the family the need to reevaluate the possibility of hypertension as an outpatient.  Reportedly earlier today the patient's blood pressure was significantly elevated in the 858+ systolic range, but it is close to normal at this time and I had my usual discussion with the patient and family about deferring nonemergent hypertension management to the primary care doctor.  They agree with the plan.   ----------------------------------------- 12:26 AM on 05/31/2018 -----------------------------------------  Non-infectious UA; will discharge as per plan.    Hinda Kehr, MD 05/31/18 [phone]

## 2018-05-30 NOTE — ED Provider Notes (Signed)
[facility] Emergency Department Provider Note    First MD Initiated Contact with Patient 05/30/18 Doran Heater     (approximate)  I have reviewed the triage vital signs and the nursing notes.   HISTORY  Chief Complaint Numbness    HPI Jennifer Welch is a 58 y.o. female the below listed past medical history presents the ER with chief complaint of tingling of her tongue, tingling of her bilateral hands and fingers as well as tingling of her right leg.  The symptoms started around noon today while she was at work.  States that when she would bend forward and left up she felt lightheaded and weak.  This happened 3 times.  She then saw the nurse administrator and they had her blood pressure checked which was elevated.      No past medical history on file. No family history on file. Past Surgical History:  Procedure Laterality Date  . ABDOMINAL HYSTERECTOMY    . BREAST SURGERY    . ECTOPIC PREGNANCY SURGERY    . FOOT SURGERY    . HAND TENDON SURGERY    . KNEE SURGERY    . OVARIAN CYST REMOVAL     There are no active problems to display for this patient.     Prior to Admission medications   Medication Sig Start Date End Date Taking? Authorizing Provider  cetirizine (ZYRTEC) 10 MG tablet Take 10 mg by mouth daily.    [provider]  clonazePAM (KLONOPIN) 0.5 MG tablet Take 0.5 mg by mouth 3 (three) times daily as needed for anxiety.    [provider]  cyclobenzaprine (FLEXERIL) 5 MG tablet Take 1-2 tablets (5-10 mg total) by mouth 3 (three) times daily as needed for muscle spasms. 12/20/17   Duanne Guess, PA-C  escitalopram (LEXAPRO) 20 MG tablet Take 20 mg by mouth daily.    [provider]  fluticasone (FLONASE) 50 MCG/ACT nasal spray Place 1 spray into both nostrils daily.    [provider]  lidocaine-hydrocortisone (ANAMANTEL HC) 3-0.5 % CREA Place 1 Applicatorful rectally 2 (two) times daily. 02/02/18   Arta Silence, MD  meloxicam (MOBIC) 15 MG tablet Take 1 tablet (15 mg total) by mouth daily. 12/20/17   Duanne Guess, PA-C  naproxen (NAPROSYN) 500 MG tablet Take 1 tablet (500 mg total) by mouth 2 (two) times daily with a meal. 04/06/18   Lavonia Drafts, MD  OXcarbazepine (TRILEPTAL) 150 MG tablet Take 150 mg by mouth 2 (two) times daily.    [provider]  pantoprazole (PROTONIX) 40 MG tablet Take 40 mg by mouth daily.    [provider]  phenazopyridine (PYRIDIUM) 200 MG tablet Take 1 tablet (200 mg total) by mouth 3 (three) times daily as needed for pain. 03/06/18 03/06/19  Harvest Dark, MD  pregabalin (LYRICA) 150 MG capsule Take 150 mg by mouth 2 (two) times daily.    [provider]  sulfamethoxazole-trimethoprim (BACTRIM DS,SEPTRA DS) 800-160 MG tablet Take 1 tablet by mouth 2 (two) times daily. 12/20/17   Duanne Guess, PA-C    Allergies Macrobid [nitrofurantoin macrocrystal] and Septra [sulfamethoxazole-trimethoprim]    Social History Social History   Tobacco Use  . Smoking status: Never Smoker  . Smokeless tobacco: Never Used  Substance Use Topics  . Alcohol use: Not Currently  . Drug use: Never    Review of Systems Patient denies headaches, rhinorrhea, blurry vision, numbness, shortness of breath, chest pain, edema, cough, abdominal pain,  nausea, vomiting, diarrhea, dysuria, fevers, rashes or hallucinations unless otherwise stated above in HPI. ____________________________________________   PHYSICAL EXAM:  VITAL SIGNS: Vitals:   05/30/18 1910  BP: (!) 164/86  Pulse: 78  Resp: 18  Temp: 98.3 F (36.8 C)  SpO2: 100%    Constitutional: Alert and oriented.  Eyes: Conjunctivae are normal.  Head: Atraumatic. Nose: No congestion/rhinnorhea. Mouth/Throat: Mucous membranes are moist.   Neck: No stridor. Painless ROM.  Cardiovascular: Normal rate, regular rhythm. Grossly normal heart sounds.  Good peripheral circulation.  Respiratory: Normal respiratory effort.  No retractions. Lungs CTAB. Gastrointestinal: Soft and nontender. No distention. No abdominal bruits. No CVA tenderness. Genitourinary: deferred Musculoskeletal: No lower extremity tenderness nor edema.  No joint effusions. Neurologic: CN- intact.  No facial droop, Normal FNF.  Normal heel to shin.  Sensation intact bilaterally. Normal speech and language. No gross focal neurologic deficits are appreciated. No gait instability. Skin:  Skin is warm, dry and intact. No rash noted. Psychiatric: Mood and affect are normal. Speech and behavior are normal.  ____________________________________________   LABS (all labs ordered are listed, but only abnormal results are displayed)  No results found for this or any previous visit (from the past 24 hour(s)). ____________________________________________  EKG My review and personal interpretation at Time: 19:28   Indication: numbness  Rate: 75  Rhythm: sinus Axis: normal Other: normal intervals, no stemi ____________________________________________  RADIOLOGY  I personally reviewed all radiographic images ordered to evaluate for the above acute complaints and reviewed radiology reports and findings.  These findings were personally discussed with the patient.  Please see medical record for radiology report.  ____________________________________________   PROCEDURES  Procedure(s) performed:  Procedures    Critical Care performed: no ____________________________________________   INITIAL IMPRESSION / ASSESSMENT AND PLAN / ED COURSE  Pertinent labs & imaging results that were available during my care of the patient were reviewed by me and considered in my medical decision making (see chart for details).   DDX: cva, tia, hypoglycemia, dehydration, electrolyte abnormality, dissection, sepsis   Jennifer Welch is a 58 y.o. who presents to the ED with symptoms as described above.  Patient without any  focal deficits at this time.  She is outside of the window for code stroke and she does not have any lateralizing findings.  Differential would also include demyelinating process therefore will order MRI.  Blood work will be ordered for the by differential.  She is otherwise well-appearing.  She does appear mildly anxious.  She is complaining of mild headache therefore will treat for headache in case this is a complex migraine.  Patient be signed out to oncoming physician pending MRI results.      As part of my medical decision making, I reviewed the following data within the [place] notes reviewed and incorporated, Labs reviewed, notes from prior ED visits and [place] Controlled Substance Database   ____________________________________________   FINAL CLINICAL IMPRESSION(S) / ED DIAGNOSES  Final diagnoses:  Paresthesia      NEW MEDICATIONS STARTED DURING THIS VISIT:  New Prescriptions   No medications on file     Note:  This document was prepared using Dragon voice recognition software and may include unintentional dictation errors.    Merlyn Lot, MD 05/30/18 2014

## 2018-05-31 LAB — URINE DRUG SCREEN, QUALITATIVE (ARMC ONLY)
Amphetamines, Ur Screen: NOT DETECTED
Barbiturates, Ur Screen: NOT DETECTED
Benzodiazepine, Ur Scrn: NOT DETECTED
CANNABINOID 50 NG, UR ~~LOC~~: NOT DETECTED
Cocaine Metabolite,Ur ~~LOC~~: NOT DETECTED
MDMA (Ecstasy)Ur Screen: NOT DETECTED
Methadone Scn, Ur: NOT DETECTED
Opiate, Ur Screen: NOT DETECTED
Phencyclidine (PCP) Ur S: NOT DETECTED
Tricyclic, Ur Screen: NOT DETECTED

## 2018-05-31 LAB — URINALYSIS, ROUTINE W REFLEX MICROSCOPIC
Bacteria, UA: NONE SEEN
Bilirubin Urine: NEGATIVE
Glucose, UA: NEGATIVE mg/dL
Hgb urine dipstick: NEGATIVE
Ketones, ur: NEGATIVE mg/dL
Nitrite: NEGATIVE
PROTEIN: NEGATIVE mg/dL
Specific Gravity, Urine: 1.014 (ref 1.005–1.030)
pH: 7 (ref 5.0–8.0)

## 2018-05-31 NOTE — Discharge Instructions (Signed)
Your workup in the Emergency Department today was reassuring.  We did not find any specific abnormalities.  We recommend you drink plenty of fluids, take your regular medications and/or any new ones prescribed today, and follow up with the doctor(s) listed in these documents as recommended.  Return to the Emergency Department if you develop new or worsening symptoms that concern you.  

## 2018-05-31 NOTE — ED Notes (Signed)
Pt verbalized understanding of d/c instructions, and f/u care. No further questions at this time. Pt ambulatory to the exit with steady gait.  

## 2018-06-10 DIAGNOSIS — R200 Anesthesia of skin: Secondary | ICD-10-CM | POA: Insufficient documentation

## 2018-06-12 ENCOUNTER — Other Ambulatory Visit: Payer: Self-pay

## 2018-06-12 ENCOUNTER — Emergency Department
Admission: EM | Admit: 2018-06-12 | Discharge: 2018-06-12 | Disposition: A | Discharge status: Home / Self Care | Payer: 59 | Attending: Emergency Medicine | Admitting: Emergency Medicine

## 2018-06-12 ENCOUNTER — Emergency Department: Payer: 59

## 2018-06-12 ENCOUNTER — Encounter: Payer: Self-pay | Admitting: Emergency Medicine

## 2018-06-12 DIAGNOSIS — F419 Anxiety disorder, unspecified: Secondary | ICD-10-CM

## 2018-06-12 DIAGNOSIS — R079 Chest pain, unspecified: Secondary | ICD-10-CM | POA: Insufficient documentation

## 2018-06-12 DIAGNOSIS — R457 State of emotional shock and stress, unspecified: Secondary | ICD-10-CM | POA: Insufficient documentation

## 2018-06-12 DIAGNOSIS — Z79899 Other long term (current) drug therapy: Secondary | ICD-10-CM | POA: Insufficient documentation

## 2018-06-12 DIAGNOSIS — N3091 Cystitis, unspecified with hematuria: Secondary | ICD-10-CM | POA: Diagnosis not present

## 2018-06-12 DIAGNOSIS — I10 Essential (primary) hypertension: Secondary | ICD-10-CM | POA: Diagnosis not present

## 2018-06-12 DIAGNOSIS — N3090 Cystitis, unspecified without hematuria: Secondary | ICD-10-CM

## 2018-06-12 DIAGNOSIS — F439 Reaction to severe stress, unspecified: Secondary | ICD-10-CM

## 2018-06-12 HISTORY — DX: Depression, unspecified: F32.A

## 2018-06-12 HISTORY — DX: Anxiety disorder, unspecified: F41.9

## 2018-06-12 HISTORY — DX: Major depressive disorder, single episode, unspecified: F32.9

## 2018-06-12 HISTORY — DX: Essential (primary) hypertension: I10

## 2018-06-12 LAB — COMPREHENSIVE METABOLIC PANEL
ALT: 13 U/L (ref 0–44)
AST: 16 U/L (ref 15–41)
Albumin: 4.2 g/dL (ref 3.5–5.0)
Alkaline Phosphatase: 48 U/L (ref 38–126)
Anion gap: 9 (ref 5–15)
BUN: 11 mg/dL (ref 6–20)
CALCIUM: 9.1 mg/dL (ref 8.9–10.3)
CO2: 26 mmol/L (ref 22–32)
Chloride: 107 mmol/L (ref 98–111)
Creatinine, Ser: 0.85 mg/dL (ref 0.44–1.00)
GFR calc non Af Amer: >60 mL/min (ref >60)
Glucose, Bld: 100 mg/dL — ABNORMAL HIGH (ref 70–99)
Potassium: 3.9 mmol/L (ref 3.5–5.1)
Sodium: 142 mmol/L (ref 135–145)
Total Bilirubin: 0.2 mg/dL — ABNORMAL LOW (ref 0.3–1.2)
Total Protein: 7.1 g/dL (ref 6.5–8.1)

## 2018-06-12 LAB — CBC WITH DIFFERENTIAL/PLATELET
ABS IMMATURE GRANULOCYTES: 0.02 10*3/uL (ref 0.00–0.07)
Basophils Absolute: 0.1 10*3/uL (ref 0.0–0.1)
Basophils Relative: 1 %
Eosinophils Absolute: 0.3 10*3/uL (ref 0.0–0.5)
Eosinophils Relative: 4 %
HEMATOCRIT: 34.9 % — AB (ref 36.0–46.0)
Hemoglobin: 11.4 g/dL — ABNORMAL LOW (ref 12.0–15.0)
Immature Granulocytes: 0 %
Lymphocytes Relative: 39 %
Lymphs Abs: 2.8 10*3/uL (ref 0.7–4.0)
MCH: 28.8 pg (ref 26.0–34.0)
MCHC: 32.7 g/dL (ref 30.0–36.0)
MCV: 88.1 fL (ref 80.0–100.0)
Monocytes Absolute: 0.4 10*3/uL (ref 0.1–1.0)
Monocytes Relative: 6 %
Neutro Abs: 3.6 10*3/uL (ref 1.7–7.7)
Neutrophils Relative %: 50 %
Platelets: 287 10*3/uL (ref 150–400)
RBC: 3.96 MIL/uL (ref 3.87–5.11)
RDW: 13.2 % (ref 11.5–15.5)
WBC: 7.2 10*3/uL (ref 4.0–10.5)
nRBC: 0 % (ref 0.0–0.2)

## 2018-06-12 LAB — URINALYSIS, COMPLETE (UACMP) WITH MICROSCOPIC
Bilirubin Urine: NEGATIVE
Glucose, UA: NEGATIVE mg/dL
Ketones, ur: NEGATIVE mg/dL
Nitrite: NEGATIVE
Protein, ur: NEGATIVE mg/dL
SPECIFIC GRAVITY, URINE: 1.01 (ref 1.005–1.030)
pH: 6 (ref 5.0–8.0)

## 2018-06-12 LAB — TROPONIN I: Troponin I: <0.03 ng/mL (ref <0.03)

## 2018-06-12 MED ORDER — HYDROCODONE-ACETAMINOPHEN 5-325 MG PO TABS: 1.0000 | ORAL_TABLET | Freq: Four times a day (QID) | ORAL | 0 refills | Status: DC | PRN

## 2018-06-12 MED ORDER — LORAZEPAM 1 MG PO TABS
1.0000 mg | ORAL_TABLET | Freq: Once | ORAL | Status: AC
  Administered 2018-06-12: 1 mg via ORAL
  Filled 2018-06-12: qty 1

## 2018-06-12 MED ORDER — CEPHALEXIN 500 MG PO CAPS: 500.0000 mg | ORAL_CAPSULE | Freq: Three times a day (TID) | ORAL | 0 refills | Status: DC

## 2018-06-12 MED ORDER — HYDROCODONE-ACETAMINOPHEN 5-325 MG PO TABS
1.0000 | ORAL_TABLET | Freq: Once | ORAL | Status: AC
  Administered 2018-06-12: 1 via ORAL
  Filled 2018-06-12: qty 1

## 2018-06-12 MED ORDER — CEPHALEXIN 500 MG PO CAPS
500.0000 mg | ORAL_CAPSULE | Freq: Once | ORAL | Status: AC
  Administered 2018-06-12: 500 mg via ORAL
  Filled 2018-06-12: qty 1

## 2018-06-12 MED ORDER — LORAZEPAM 1 MG PO TABS: 1.0000 mg | ORAL_TABLET | Freq: Three times a day (TID) | ORAL | 0 refills | Status: DC | PRN

## 2018-06-12 NOTE — ED Provider Notes (Signed)
Pharmacy calls back because patient has just gotten a prescription for Ativan but on 3 4 she had a prescription for Klonopin and [facility] says that she should still have Klonopin left.  I therefore told her not to fill the Ativan prescription at least not for now.   Nena Polio, MD 06/12/18 1447

## 2018-06-12 NOTE — ED Notes (Signed)
Pt requests to speak with MD regarding her pelvic pain and need for pain medication; Dr Beather Arbour notified

## 2018-06-12 NOTE — ED Provider Notes (Signed)
[facility] Emergency Department Provider Note   ____________________________________________   First MD Initiated Contact with Patient 06/12/18 [phone]     (approximate)  I have reviewed the triage vital signs and the nursing notes.   HISTORY  Chief Complaint Chest Pain    HPI Jennifer Welch is a 58 y.o. female who presents to the ED from home with a chief complaint of chest pain and hematuria.  Patient reports left-sided chest tightness for the past few days accompanied by palpitations.  Reports constant, nonradiating tightness which is exacerbated by anxiety over the coronavirus pandemic.  Denies associated diaphoresis, shortness of breath, nausea/vomiting or dizziness.  Patient does drink multiple sodas daily but for the past 2 days has been drinking Kool-Aid instead.  Denies recent fever, chills, cough, travel, exposure to persons with coronavirus.  At 9 PM she thought she was having vaginal bleeding but realize she had some blood when she urinated.  Patient has a history of multiple UTIs usually E. coli.  Denies use of anticoagulants.       Past Medical History:  Diagnosis Date  . Anxiety   . Depression   . Hypertension     There are no active problems to display for this patient.   Past Surgical History:  Procedure Laterality Date  . ABDOMINAL HYSTERECTOMY    . BREAST SURGERY    . ECTOPIC PREGNANCY SURGERY    . FOOT SURGERY    . HAND TENDON SURGERY    . KNEE SURGERY    . OVARIAN CYST REMOVAL      Prior to Admission medications   Medication Sig Start Date End Date Taking? Authorizing Provider  cephALEXin (KEFLEX) 500 MG capsule Take 1 capsule (500 mg total) by mouth 3 (three) times daily. 06/12/18   Paulette Blanch, MD  cetirizine (ZYRTEC) 10 MG tablet Take 10 mg by mouth daily.    [provider]  clonazePAM (KLONOPIN) 0.5 MG tablet Take 0.5 mg by mouth 3 (three) times daily as needed for anxiety.    [provider]   cyclobenzaprine (FLEXERIL) 5 MG tablet Take 1-2 tablets (5-10 mg total) by mouth 3 (three) times daily as needed for muscle spasms. 12/20/17   Duanne Guess, PA-C  escitalopram (LEXAPRO) 20 MG tablet Take 20 mg by mouth daily.    [provider]  fluticasone (FLONASE) 50 MCG/ACT nasal spray Place 1 spray into both nostrils daily.    [provider]  lidocaine-hydrocortisone (ANAMANTEL HC) 3-0.5 % CREA Place 1 Applicatorful rectally 2 (two) times daily. 02/02/18   Arta Silence, MD  LORazepam (ATIVAN) 1 MG tablet Take 1 tablet (1 mg total) by mouth every 8 (eight) hours as needed for anxiety. 06/12/18   Paulette Blanch, MD  meloxicam (MOBIC) 15 MG tablet Take 1 tablet (15 mg total) by mouth daily. 12/20/17   Duanne Guess, PA-C  naproxen (NAPROSYN) 500 MG tablet Take 1 tablet (500 mg total) by mouth 2 (two) times daily with a meal. 04/06/18   Lavonia Drafts, MD  OXcarbazepine (TRILEPTAL) 150 MG tablet Take 150 mg by mouth 2 (two) times daily.    [provider]  pantoprazole (PROTONIX) 40 MG tablet Take 40 mg by mouth daily.    [provider]  phenazopyridine (PYRIDIUM) 200 MG tablet Take 1 tablet (200 mg total) by mouth 3 (three) times daily as needed for pain. 03/06/18 03/06/19  Harvest Dark, MD  pregabalin (LYRICA) 150 MG capsule Take 150 mg by  mouth 2 (two) times daily.    [provider]  sulfamethoxazole-trimethoprim (BACTRIM DS,SEPTRA DS) 800-160 MG tablet Take 1 tablet by mouth 2 (two) times daily. 12/20/17   Duanne Guess, PA-C    Allergies Macrobid [nitrofurantoin macrocrystal] and Septra [sulfamethoxazole-trimethoprim]  No family history on file.  Social History Social History   Tobacco Use  . Smoking status: Never Smoker  . Smokeless tobacco: Never Used  Substance Use Topics  . Alcohol use: Not Currently  . Drug use: Never    Review of Systems  Constitutional: No fever/chills Eyes: No visual changes. ENT: No sore  throat. Cardiovascular: Positive for chest pain. Respiratory: Denies shortness of breath. Gastrointestinal: No abdominal pain.  No nausea, no vomiting.  No diarrhea.  No constipation. Genitourinary: Positive for hematuria.  Negative for dysuria. Musculoskeletal: Negative for back pain. Skin: Negative for rash. Neurological: Negative for headaches, focal weakness or numbness. Psychiatric: Positive for stress/anxiety.  ____________________________________________   PHYSICAL EXAM:  VITAL SIGNS: ED Triage Vitals  Enc Vitals Group     BP 06/12/18 0233 135/79     Pulse Rate 06/12/18 0233 78     Resp 06/12/18 0233 18     Temp 06/12/18 0233 98.2 F (36.8 C)     Temp Source 06/12/18 0233 Oral     SpO2 06/12/18 0233 97 %     Weight 06/12/18 0233 187 lb (84.8 kg)     Height 06/12/18 0233 5\' 7"  (1.702 m)     Head Circumference --      Peak Flow --      Pain Score 06/12/18 0232 8     Pain Loc --      Pain Edu? --      Excl. in [place]? --     Constitutional: Alert and oriented. Well appearing and in no acute distress. Eyes: Conjunctivae are normal. PERRL. EOMI. Head: Atraumatic. Nose: No congestion/rhinnorhea. Mouth/Throat: Mucous membranes are moist.  Oropharynx non-erythematous. Neck: No stridor.   Cardiovascular: Normal rate, regular rhythm. Grossly normal heart sounds.  Good peripheral circulation. Respiratory: Normal respiratory effort.  No retractions. Lungs CTAB. Gastrointestinal: Soft and nontender. No distention. No abdominal bruits. No CVA tenderness. Musculoskeletal: No lower extremity tenderness nor edema.  No joint effusions. Neurologic:  Normal speech and language. No gross focal neurologic deficits are appreciated. No gait instability. Skin:  Skin is warm, dry and intact. No rash noted. Psychiatric: Mood and affect are slightly anxious. Speech and behavior are normal.  ____________________________________________   LABS (all labs ordered are listed, but only abnormal  results are displayed)  Labs Reviewed  CBC WITH DIFFERENTIAL/PLATELET - Abnormal; Notable for the following components:      Result Value   Hemoglobin 11.4 (*)    HCT 34.9 (*)    All other components within normal limits  COMPREHENSIVE METABOLIC PANEL - Abnormal; Notable for the following components:   Glucose, Bld 100 (*)    Total Bilirubin 0.2 (*)    All other components within normal limits  URINALYSIS, COMPLETE (UACMP) WITH MICROSCOPIC - Abnormal; Notable for the following components:   Color, Urine STRAW (*)    APPearance CLEAR (*)    Hgb urine dipstick MODERATE (*)    Leukocytes,Ua MODERATE (*)    Bacteria, UA RARE (*)    All other components within normal limits  URINE CULTURE  TROPONIN I   ____________________________________________  EKG  ED ECG REPORT I, SUNG,JADE J, the attending physician, personally viewed and interpreted this ECG.   Date:  06/12/2018  EKG Time: 0236  Rate: 75  Rhythm: normal EKG, normal sinus rhythm  Axis: Normal  Intervals:none  ST&T Change: Nonspecific  ____________________________________________  RADIOLOGY  ED MD interpretation: Wet read: No acute cardiopulmonary process  Official radiology report(s): No results found.  ____________________________________________   PROCEDURES  Procedure(s) performed (including Critical Care):  Procedures   ____________________________________________   INITIAL IMPRESSION / ASSESSMENT AND PLAN / ED COURSE  As part of my medical decision making, I reviewed the following data within the [place] notes reviewed and incorporated, Labs reviewed, Old EKG reviewed, Old chart reviewed, Radiograph reviewed and Notes from prior ED visits        58 year old female who presents with a several day history of chest tightness exacerbated by stress/anxiety; also with complaints of hematuria. Differential diagnosis includes, but is not limited to, ACS, aortic dissection,  pulmonary embolism, cardiac tamponade, pneumothorax, pneumonia, pericarditis, myocarditis, GI-related causes including esophagitis/gastritis, and musculoskeletal chest wall pain.    Laboratory results remarkable for moderate leukocytes, RBC and WBC and urinalysis.  Troponin is negative.  Do not feel timed troponin is warranted as patient has been having symptoms for the past several days.  Will add urine culture given patient's history of multiple UTIs.  Will place on Keflex and prescribed Ativan to use as needed for anxiety over current coronavirus pandemic.  Strict return precautions given.  Patient verbalizes understanding agrees with plan of care.      ____________________________________________   FINAL CLINICAL IMPRESSION(S) / ED DIAGNOSES  Final diagnoses:  Nonspecific chest pain  Stress  Anxiety  Cystitis     ED Discharge Orders         Ordered    cephALEXin (KEFLEX) 500 MG capsule  3 times daily     06/12/18 0406    LORazepam (ATIVAN) 1 MG tablet  Every 8 hours PRN     06/12/18 0406           Note:  This document was prepared using Dragon voice recognition software and may include unintentional dictation errors.   Paulette Blanch, MD 06/12/18 [phone]

## 2018-06-12 NOTE — Discharge Instructions (Addendum)
1.  Take antibiotic as prescribed: Keflex 500 mg 3 times daily x7 days.  Urine culture is pending.  You will be notified if we need to switch your antibiotic. 2.  You may take anxiety medicine as needed: Ativan 1 mg. 3.  Return to the ER for worsening symptoms, persistent vomiting, difficulty breathing or other concerns.

## 2018-06-12 NOTE — ED Notes (Signed)
Pt uprite on stretcher with no distress noted; reports vag bleeding earlier tonight but none at present; pt is concerned because she has had a hysterectomy several years ago; also reports had left sided CP tonight accomp by palpitations but believes it is anxiety due to her bleeding; pt denies any accomp symptoms; resp even/unlab, lungs clear, apical audible & regular, +BS, abd soft/nondist/nontender

## 2018-06-12 NOTE — ED Triage Notes (Signed)
Patient ambulatory to triage with steady gait, without difficulty or distress noted; pt reports vag bleeding since 9pm; also c/o left sided CP accomp by "palpitations"

## 2018-06-12 NOTE — ED Notes (Signed)
Pt to xray via stretcher accomp by xray tech 

## 2018-06-13 LAB — URINE CULTURE

## 2018-07-16 DIAGNOSIS — R030 Elevated blood-pressure reading, without diagnosis of hypertension: Secondary | ICD-10-CM | POA: Insufficient documentation

## 2018-07-28 ENCOUNTER — Other Ambulatory Visit: Payer: Self-pay | Admitting: Physical Medicine and Rehabilitation

## 2018-07-28 ENCOUNTER — Other Ambulatory Visit (HOSPITAL_COMMUNITY): Payer: Self-pay | Admitting: Physical Medicine and Rehabilitation

## 2018-07-28 DIAGNOSIS — M5416 Radiculopathy, lumbar region: Secondary | ICD-10-CM

## 2018-08-04 ENCOUNTER — Ambulatory Visit: Payer: 59

## 2018-08-06 DIAGNOSIS — R200 Anesthesia of skin: Secondary | ICD-10-CM | POA: Insufficient documentation

## 2018-08-11 ENCOUNTER — Ambulatory Visit: Admission: RE | Admit: 2018-08-11 | Payer: 59 | Source: Ambulatory Visit

## 2018-08-12 DIAGNOSIS — R1032 Left lower quadrant pain: Secondary | ICD-10-CM | POA: Insufficient documentation

## 2018-08-21 ENCOUNTER — Other Ambulatory Visit: Payer: Self-pay

## 2018-08-21 ENCOUNTER — Ambulatory Visit
Admission: RE | Admit: 2018-08-21 | Discharge: 2018-08-21 | Disposition: A | Discharge status: Home / Self Care | Payer: 59 | Source: Ambulatory Visit | Attending: Physical Medicine and Rehabilitation | Admitting: Physical Medicine and Rehabilitation

## 2018-08-21 DIAGNOSIS — M5416 Radiculopathy, lumbar region: Secondary | ICD-10-CM

## 2018-08-25 ENCOUNTER — Other Ambulatory Visit: Payer: Self-pay | Admitting: Internal Medicine

## 2018-08-25 DIAGNOSIS — R1032 Left lower quadrant pain: Secondary | ICD-10-CM

## 2018-08-25 DIAGNOSIS — K582 Mixed irritable bowel syndrome: Secondary | ICD-10-CM

## 2018-09-08 ENCOUNTER — Other Ambulatory Visit: Payer: Self-pay

## 2018-09-08 ENCOUNTER — Ambulatory Visit
Admission: RE | Admit: 2018-09-08 | Discharge: 2018-09-08 | Disposition: A | Discharge status: Home / Self Care | Payer: 59 | Source: Ambulatory Visit | Attending: Internal Medicine | Admitting: Internal Medicine

## 2018-09-08 DIAGNOSIS — R1032 Left lower quadrant pain: Secondary | ICD-10-CM

## 2018-09-08 DIAGNOSIS — K582 Mixed irritable bowel syndrome: Secondary | ICD-10-CM

## 2018-09-08 MED ORDER — IOPAMIDOL (ISOVUE-300) INJECTION 61%
100.0000 mL | Freq: Once | INTRAVENOUS | Status: AC | PRN
  Administered 2018-09-08: 16:00:00 100 mL via INTRAVENOUS

## 2018-09-21 ENCOUNTER — Emergency Department: Payer: 59

## 2018-09-21 ENCOUNTER — Emergency Department
Admission: EM | Admit: 2018-09-21 | Discharge: 2018-09-21 | Disposition: A | Discharge status: Home / Self Care | Payer: 59 | Attending: Emergency Medicine | Admitting: Emergency Medicine

## 2018-09-21 ENCOUNTER — Other Ambulatory Visit: Payer: Self-pay

## 2018-09-21 DIAGNOSIS — M5410 Radiculopathy, site unspecified: Secondary | ICD-10-CM | POA: Diagnosis not present

## 2018-09-21 DIAGNOSIS — R202 Paresthesia of skin: Secondary | ICD-10-CM | POA: Diagnosis not present

## 2018-09-21 DIAGNOSIS — M542 Cervicalgia: Secondary | ICD-10-CM | POA: Diagnosis present

## 2018-09-21 LAB — URINALYSIS, COMPLETE (UACMP) WITH MICROSCOPIC
Bacteria, UA: NONE SEEN
Bilirubin Urine: NEGATIVE
Glucose, UA: NEGATIVE mg/dL
Hgb urine dipstick: NEGATIVE
Ketones, ur: NEGATIVE mg/dL
Leukocytes,Ua: NEGATIVE
Nitrite: NEGATIVE
Protein, ur: NEGATIVE mg/dL
Specific Gravity, Urine: 1.01 (ref 1.005–1.030)
pH: 8 (ref 5.0–8.0)

## 2018-09-21 LAB — CBC
HCT: 36.3 % (ref 36.0–46.0)
Hemoglobin: 11.9 g/dL — ABNORMAL LOW (ref 12.0–15.0)
MCH: 28.6 pg (ref 26.0–34.0)
MCHC: 32.8 g/dL (ref 30.0–36.0)
MCV: 87.3 fL (ref 80.0–100.0)
Platelets: 310 10*3/uL (ref 150–400)
RBC: 4.16 MIL/uL (ref 3.87–5.11)
RDW: 12.8 % (ref 11.5–15.5)
WBC: 5.7 10*3/uL (ref 4.0–10.5)
nRBC: 0 % (ref 0.0–0.2)

## 2018-09-21 LAB — TROPONIN I (HIGH SENSITIVITY)
Troponin I (High Sensitivity): 3 ng/L (ref <18)
Troponin I (High Sensitivity): 3 ng/L (ref <18)

## 2018-09-21 LAB — BASIC METABOLIC PANEL
Anion gap: 11 (ref 5–15)
BUN: 11 mg/dL (ref 6–20)
CO2: 24 mmol/L (ref 22–32)
Calcium: 9.4 mg/dL (ref 8.9–10.3)
Chloride: 105 mmol/L (ref 98–111)
Creatinine, Ser: 0.79 mg/dL (ref 0.44–1.00)
GFR calc Af Amer: >60 mL/min (ref >60)
GFR calc non Af Amer: >60 mL/min (ref >60)
Glucose, Bld: 109 mg/dL — ABNORMAL HIGH (ref 70–99)
Potassium: 3.9 mmol/L (ref 3.5–5.1)
Sodium: 140 mmol/L (ref 135–145)

## 2018-09-21 LAB — POCT PREGNANCY, URINE: Preg Test, Ur: NEGATIVE

## 2018-09-21 MED ORDER — SODIUM CHLORIDE 0.9% FLUSH: 3.0000 mL | Freq: Once | INTRAVENOUS | Status: DC

## 2018-09-21 NOTE — ED Triage Notes (Signed)
Pt c/o neck pain radiating into the left arm and numbness of her lips since last night at work, while in the room pt c/o right hand "feeling funny"..states we prescribed ativan last night but she did not want to take one today but she was worried she is having a Heart attack or a stroke.

## 2018-09-21 NOTE — ED Provider Notes (Signed)
[facility] Emergency Department Provider Note  Time seen: 1:09 PM  I have reviewed the triage vital signs and the nursing notes.   HISTORY  Chief Complaint Arm Pain and Numbness   HPI Jennifer Welch is a 58 y.o. female with a past medical history of anxiety, depression, presents to the emergency department with concerns of neck pain, left arm discomfort, tingling in her lips.  According to the patient in March of this past year she presented with very similar symptoms which she describes as neck pain going down her left arm and tingling of her lips and tongue at that time.  Patient had an extensive work-up including MRIs of the brain and C-spine which were essentially negative.  Patient states yesterday she developed pain in the right side of her neck which radiated down into her left upper back and down her left arm.  Patient denies any chest pain.  States last night she felt like she was having some mild tingling in her lips so she became concerned.  States she is prescribed Lorazepam for anxiety, but states she wanted to be seen by Dr. first before she took it to ensure that it was not a stroke or heart attack.  Patient states very mild pain in the right neck with occasional pain down the left arm.   Past Medical History:  Diagnosis Date  . Anxiety   . Depression     There are no active problems to display for this patient.   Past Surgical History:  Procedure Laterality Date  . ABDOMINAL HYSTERECTOMY    . BREAST SURGERY    . ECTOPIC PREGNANCY SURGERY    . FOOT SURGERY    . HAND TENDON SURGERY    . KNEE SURGERY    . OVARIAN CYST REMOVAL      Prior to Admission medications   Medication Sig Start Date End Date Taking? Authorizing Provider  cephALEXin (KEFLEX) 500 MG capsule Take 1 capsule (500 mg total) by mouth 3 (three) times daily. 06/12/18   Paulette Blanch, MD  cetirizine (ZYRTEC) 10 MG tablet Take 10 mg by mouth daily.    [provider]   clonazePAM (KLONOPIN) 0.5 MG tablet Take 0.5 mg by mouth 3 (three) times daily as needed for anxiety.    [provider]  cyclobenzaprine (FLEXERIL) 5 MG tablet Take 1-2 tablets (5-10 mg total) by mouth 3 (three) times daily as needed for muscle spasms. 12/20/17   Duanne Guess, PA-C  escitalopram (LEXAPRO) 20 MG tablet Take 20 mg by mouth daily.    [provider]  fluticasone (FLONASE) 50 MCG/ACT nasal spray Place 1 spray into both nostrils daily.    [provider]  HYDROcodone-acetaminophen (NORCO) 5-325 MG tablet Take 1 tablet by mouth every 6 (six) hours as needed for moderate pain. 06/12/18   Paulette Blanch, MD  lidocaine-hydrocortisone (ANAMANTEL HC) 3-0.5 % CREA Place 1 Applicatorful rectally 2 (two) times daily. 02/02/18   Arta Silence, MD  LORazepam (ATIVAN) 1 MG tablet Take 1 tablet (1 mg total) by mouth every 8 (eight) hours as needed for anxiety. 06/12/18   Paulette Blanch, MD  meloxicam (MOBIC) 15 MG tablet Take 1 tablet (15 mg total) by mouth daily. 12/20/17   Duanne Guess, PA-C  naproxen (NAPROSYN) 500 MG tablet Take 1 tablet (500 mg total) by mouth 2 (two) times daily with a meal. 04/06/18   Lavonia Drafts, MD  OXcarbazepine (TRILEPTAL) 150 MG tablet Take 150  mg by mouth 2 (two) times daily.    [provider]  pantoprazole (PROTONIX) 40 MG tablet Take 40 mg by mouth daily.    [provider]  phenazopyridine (PYRIDIUM) 200 MG tablet Take 1 tablet (200 mg total) by mouth 3 (three) times daily as needed for pain. 03/06/18 03/06/19  Harvest Dark, MD  pregabalin (LYRICA) 150 MG capsule Take 150 mg by mouth 2 (two) times daily.    [provider]  sulfamethoxazole-trimethoprim (BACTRIM DS,SEPTRA DS) 800-160 MG tablet Take 1 tablet by mouth 2 (two) times daily. 12/20/17   Duanne Guess, PA-C    Allergies  Allergen Reactions  . Macrobid [Nitrofurantoin Macrocrystal] Rash  . Septra [Sulfamethoxazole-Trimethoprim] Rash     No family history on file.  Social History Social History   Tobacco Use  . Smoking status: Never Smoker  . Smokeless tobacco: Never Used  Substance Use Topics  . Alcohol use: Not Currently  . Drug use: Never    Review of Systems Constitutional: Negative for fever. Cardiovascular: Negative for chest pain. Respiratory: Negative for shortness of breath. Gastrointestinal: Negative for abdominal pain, vomiting  Genitourinary: Negative for urinary compaints Musculoskeletal: Right neck pain occasional left upper extremity pain Skin: Negative for skin complaints  Neurological: Negative for headache. tingling of her lips last night. All other ROS negative  ____________________________________________   PHYSICAL EXAM:  VITAL SIGNS: ED Triage Vitals  Enc Vitals Group     BP 09/21/18 1137 (!) 165/90     Pulse Rate 09/21/18 1137 82     Resp 09/21/18 1137 18     Temp 09/21/18 1137 99.5 F (37.5 C)     Temp Source 09/21/18 1137 Oral     SpO2 09/21/18 1137 97 %     Weight 09/21/18 1138 190 lb (86.2 kg)     Height 09/21/18 1138 5\' 7"  (1.702 m)     Head Circumference --      Peak Flow --      Pain Score 09/21/18 1138 8     Pain Loc --      Pain Edu? --      Excl. in [place]? --    Constitutional: Alert and oriented. Well appearing and in no distress. Eyes: Normal exam ENT      Head: Normocephalic and atraumatic.      Mouth/Throat: Mucous membranes are moist. Cardiovascular: Normal rate, regular rhythm.  Respiratory: Normal respiratory effort without tachypnea nor retractions. Breath sounds are clear  Gastrointestinal: Soft and nontender. No distention.  Musculoskeletal: Nontender with normal range of motion in all extremities.  Neurologic:  Normal speech and language. No gross focal neurologic deficits.  No cranial nerve deficits.  Intact and equal grip strength without drift. Skin:  Skin is warm, dry and intact.  Psychiatric: Moderately anxious  appearing.  ____________________________________________    EKG  EKG viewed and interpreted by myself shows a normal sinus rhythm 85 bpm with a narrow QRS, normal axis, normal intervals, no concerning ST changes.  ____________________________________________    INITIAL IMPRESSION / ASSESSMENT AND PLAN / ED COURSE  Pertinent labs & imaging results that were available during my care of the patient were reviewed by me and considered in my medical decision making (see chart for details).   Patient presents to the emergency department pain down her left arm.  States she was having tingling of her lips last night.  Overall the patient appears well she does appear moderately anxious during our exam.  Patient's work-up is  overall nonrevealing.  Intact neurological exam.  Patient's work-up is largely nonrevealing.  Troponin negative.  Patient states she had the same symptoms back in March, reviewed the patient's work-up at that time which essentially shows a normal MRI of the brain and C-spine.  Given normal neurological exam with recent normal MRIs I do not believe repeat imaging would be of much benefit at this time.  With the patient's cardiac enzyme being negative I believe patient is safe from a cardiovascular standpoint for discharge home and outpatient follow-up.  Patient thinks this could just be anxiety but did not want to take anything for it until she was seen by Dr.  On review of systems patient does state mild dysuria over the past several days.  We will check urinalysis.  Urinalysis is normal.  We will discharge patient from the emergency department.   Jennifer Welch was evaluated in Emergency Department on 09/21/2018 for the symptoms described in the history of present illness. She was evaluated in the context of the global COVID-19 pandemic, which necessitated consideration that the patient might be at risk for infection with the SARS-CoV-2 virus that causes COVID-19. Institutional  protocols and algorithms that pertain to the evaluation of patients at risk for COVID-19 are in a state of rapid change based on information released by regulatory bodies including the CDC and federal and state organizations. These policies and algorithms were followed during the patient's care in the ED.  ____________________________________________   FINAL CLINICAL IMPRESSION(S) / ED DIAGNOSES  Paresthesia Musculoskeletal pain   Harvest Dark, MD 09/21/18 1414

## 2018-11-16 ENCOUNTER — Other Ambulatory Visit: Payer: Self-pay

## 2018-11-16 ENCOUNTER — Emergency Department: Payer: 59

## 2018-11-16 ENCOUNTER — Emergency Department
Admission: EM | Admit: 2018-11-16 | Discharge: 2018-11-16 | Disposition: A | Discharge status: Home / Self Care | Payer: 59 | Attending: Emergency Medicine | Admitting: Emergency Medicine

## 2018-11-16 DIAGNOSIS — Y999 Unspecified external cause status: Secondary | ICD-10-CM | POA: Diagnosis not present

## 2018-11-16 DIAGNOSIS — Y9389 Activity, other specified: Secondary | ICD-10-CM | POA: Diagnosis not present

## 2018-11-16 DIAGNOSIS — W228XXA Striking against or struck by other objects, initial encounter: Secondary | ICD-10-CM | POA: Insufficient documentation

## 2018-11-16 DIAGNOSIS — S92534A Nondisplaced fracture of distal phalanx of right lesser toe(s), initial encounter for closed fracture: Secondary | ICD-10-CM | POA: Diagnosis not present

## 2018-11-16 DIAGNOSIS — Y929 Unspecified place or not applicable: Secondary | ICD-10-CM | POA: Insufficient documentation

## 2018-11-16 DIAGNOSIS — B351 Tinea unguium: Secondary | ICD-10-CM | POA: Diagnosis not present

## 2018-11-16 DIAGNOSIS — Z79899 Other long term (current) drug therapy: Secondary | ICD-10-CM | POA: Insufficient documentation

## 2018-11-16 DIAGNOSIS — S99921A Unspecified injury of right foot, initial encounter: Secondary | ICD-10-CM | POA: Diagnosis present

## 2018-11-16 MED ORDER — OMEPRAZOLE 10 MG PO CPDR: 10.0000 mg | DELAYED_RELEASE_CAPSULE | Freq: Every day | ORAL | 0 refills | Status: DC

## 2018-11-16 MED ORDER — HYDROCODONE-ACETAMINOPHEN 5-325 MG PO TABS
1.0000 | ORAL_TABLET | Freq: Once | ORAL | Status: AC
  Administered 2018-11-16: 1 via ORAL
  Filled 2018-11-16: qty 1

## 2018-11-16 MED ORDER — HYDROCODONE-ACETAMINOPHEN 5-325 MG PO TABS: 1.0000 | ORAL_TABLET | ORAL | 0 refills | Status: DC | PRN

## 2018-11-16 MED ORDER — EFINACONAZOLE 10 % EX SOLN: 1.0000 "application " | Freq: Every day | CUTANEOUS | 6 refills | Status: DC

## 2018-11-16 MED ORDER — MELOXICAM 15 MG PO TABS: 15.0000 mg | ORAL_TABLET | Freq: Every day | ORAL | 0 refills | Status: DC

## 2018-11-16 NOTE — ED Provider Notes (Signed)
[facility] Emergency Department Provider Note  ____________________________________________  Time seen: Approximately 8:43 PM  I have reviewed the triage vital signs and the nursing notes.   HISTORY  Chief Complaint Toe Injury    HPI Jennifer Welch is a 58 y.o. female who presents emergency department complaining of pain to the second digit of the right foot.  Patient accidentally kicked an object, and has had bruising, pain to the digit since.  Patient reports that she has a history of a remote fracture to the great toe and symptoms as far as bruising, edema and pain are consistent with previous fracture to the other digit.  No other injury or complaint at this time.  No medications prior to arrival.  Patient has been ambulatory on her foot since the injury occurred approximately 6 hours ago.         Past Medical History:  Diagnosis Date  . Anxiety   . Depression     There are no active problems to display for this patient.   Past Surgical History:  Procedure Laterality Date  . ABDOMINAL HYSTERECTOMY    . BREAST SURGERY    . ECTOPIC PREGNANCY SURGERY    . FOOT SURGERY    . HAND TENDON SURGERY    . KNEE SURGERY    . OVARIAN CYST REMOVAL      Prior to Admission medications   Medication Sig Start Date End Date Taking? Authorizing Provider  cephALEXin (KEFLEX) 500 MG capsule Take 1 capsule (500 mg total) by mouth 3 (three) times daily. 06/12/18   Paulette Blanch, MD  cetirizine (ZYRTEC) 10 MG tablet Take 10 mg by mouth daily.    [provider]  clonazePAM (KLONOPIN) 0.5 MG tablet Take 0.5 mg by mouth 3 (three) times daily as needed for anxiety.    [provider]  cyclobenzaprine (FLEXERIL) 5 MG tablet Take 1-2 tablets (5-10 mg total) by mouth 3 (three) times daily as needed for muscle spasms. 12/20/17   Duanne Guess, PA-C  Efinaconazole 10 % SOLN Apply 1 application topically daily. Apply 1 drop to the end of the toenail, use  applicator to spread medication over the toenail.  Apply for 48 weeks. 11/16/18 10/18/19  Devaunte Gasparini, Charline Bills, PA-C  escitalopram (LEXAPRO) 20 MG tablet Take 20 mg by mouth daily.    [provider]  fluticasone (FLONASE) 50 MCG/ACT nasal spray Place 1 spray into both nostrils daily.    [provider]  HYDROcodone-acetaminophen (NORCO) 5-325 MG tablet Take 1 tablet by mouth every 6 (six) hours as needed for moderate pain. 06/12/18   Paulette Blanch, MD  HYDROcodone-acetaminophen (NORCO/VICODIN) 5-325 MG tablet Take 1 tablet by mouth every 4 (four) hours as needed for moderate pain. 11/16/18   Donna Snooks, Charline Bills, PA-C  lidocaine-hydrocortisone (ANAMANTEL HC) 3-0.5 % CREA Place 1 Applicatorful rectally 2 (two) times daily. 02/02/18   Arta Silence, MD  LORazepam (ATIVAN) 1 MG tablet Take 1 tablet (1 mg total) by mouth every 8 (eight) hours as needed for anxiety. 06/12/18   Paulette Blanch, MD  meloxicam (MOBIC) 15 MG tablet Take 1 tablet (15 mg total) by mouth daily. 11/16/18   Ned Kakar, Charline Bills, PA-C  naproxen (NAPROSYN) 500 MG tablet Take 1 tablet (500 mg total) by mouth 2 (two) times daily with a meal. 04/06/18   Lavonia Drafts, MD  omeprazole (PRILOSEC) 10 MG capsule Take 1 capsule (10 mg total) by mouth daily. 11/16/18   Tishara Pizano, Charline Bills, PA-C  OXcarbazepine (TRILEPTAL) 150 MG tablet Take 150 mg by mouth 2 (two) times daily.    [provider]  pantoprazole (PROTONIX) 40 MG tablet Take 40 mg by mouth daily.    [provider]  phenazopyridine (PYRIDIUM) 200 MG tablet Take 1 tablet (200 mg total) by mouth 3 (three) times daily as needed for pain. 03/06/18 03/06/19  Harvest Dark, MD  pregabalin (LYRICA) 150 MG capsule Take 150 mg by mouth 2 (two) times daily.    [provider]  sulfamethoxazole-trimethoprim (BACTRIM DS,SEPTRA DS) 800-160 MG tablet Take 1 tablet by mouth 2 (two) times daily. 12/20/17   Duanne Guess, PA-C     Allergies Macrobid [nitrofurantoin macrocrystal] and Septra [sulfamethoxazole-trimethoprim]  No family history on file.  Social History Social History   Tobacco Use  . Smoking status: Never Smoker  . Smokeless tobacco: Never Used  Substance Use Topics  . Alcohol use: Not Currently  . Drug use: Never     Review of Systems  Constitutional: No fever/chills Eyes: No visual changes. No discharge ENT: No upper respiratory complaints. Cardiovascular: no chest pain. Respiratory: no cough. No SOB. Gastrointestinal: No abdominal pain.  No nausea, no vomiting.  Musculoskeletal: Positive for injury to the second digit of the right foot Skin: Negative for rash, abrasions, lacerations, ecchymosis. Neurological: Negative for headaches, focal weakness or numbness. 10-point ROS otherwise negative.  ____________________________________________   PHYSICAL EXAM:  VITAL SIGNS: ED Triage Vitals [11/16/18 1906]  Enc Vitals Group     BP (!) 150/85     Pulse Rate 82     Resp 20     Temp 99 F (37.2 C)     Temp Source Oral     SpO2 100 %     Weight 184 lb (83.5 kg)     Height 5\' 7"  (1.702 m)     Head Circumference      Peak Flow      Pain Score 10     Pain Loc      Pain Edu?      Excl. in [place]?      Constitutional: Alert and oriented. Well appearing and in no acute distress. Eyes: Conjunctivae are normal. PERRL. EOMI. Head: Atraumatic. Neck: No stridor.    Cardiovascular: Normal rate, regular rhythm. Normal S1 and S2.  Good peripheral circulation. Respiratory: Normal respiratory effort without tachypnea or retractions. Lungs CTAB. Good air entry to the bases with no decreased or absent breath sounds. Musculoskeletal: Full range of motion to all extremities. No gross deformities appreciated.  Visualization of the right foot reveals edema, ecchymosis about the second digit.  No open wounds.  No other significant visible injury to the right foot.  Sensation intact to the digit.   Capillary refill intact to the digit.  Toe is diffusely tender to palpation. Neurologic:  Normal speech and language. No gross focal neurologic deficits are appreciated.  Skin:  Skin is warm, dry and intact. No rash noted. Psychiatric: Mood and affect are normal. Speech and behavior are normal. Patient exhibits appropriate insight and judgement.   ____________________________________________   LABS (all labs ordered are listed, but only abnormal results are displayed)  Labs Reviewed - No data to display ____________________________________________  EKG   ____________________________________________  RADIOLOGY I personally viewed and evaluated these images as part of my medical decision making, as well as reviewing the written report by the radiologist.  Dg Foot 2 Views Right  Result Date: 11/16/2018 CLINICAL DATA:  Pain EXAM: RIGHT FOOT -  2 VIEW COMPARISON:  None. FINDINGS: There are advanced degenerative changes involving the interphalangeal joint of the first digit. Findings are suspicious for a fracture involving the dorsal base of the distal phalanx of the second digit. This is only visualized on the lateral view. IMPRESSION: 1. Findings suspicious for a fracture involving the dorsal base of the distal phalanx of the second digit, only visualized on the lateral view. 2. Advanced degenerative changes are noted of the interphalangeal joint of the first digit. Electronically Signed   By: Constance Holster M.D.   On: 11/16/2018 19:45    ____________________________________________    PROCEDURES  Procedure(s) performed:    Procedures    Medications  HYDROcodone-acetaminophen (NORCO/VICODIN) 5-325 MG per tablet 1 tablet (has no administration in time range)     ____________________________________________   INITIAL IMPRESSION / ASSESSMENT AND PLAN / ED COURSE  Pertinent labs & imaging results that were available during my care of the patient were reviewed by me and  considered in my medical decision making (see chart for details).  Review of the [place] CSRS was performed in accordance of the [place] prior to dispensing any controlled drugs.           Patient's diagnosis is consistent with fracture to the second toe of the right foot.  Patient presents emergency department with an injury to the toe of the right foot.  X-ray confirms fracture.  Patient has a postop shoe from a previous fractured toe that she is already utilizing.  Toes are buddy taped together.  Patient will be prescribed anti-inflammatory and limited pain medication.  Patient also has signs of toenail fungus to the great digit.  Patient is requesting medication for same.  Patiently placed on topical antifungal.  Follow-up with podiatry as needed.. Patient is given ED precautions to return to the ED for any worsening or new symptoms.     ____________________________________________  FINAL CLINICAL IMPRESSION(S) / ED DIAGNOSES  Final diagnoses:  Closed nondisplaced fracture of distal phalanx of lesser toe of right foot, initial encounter  Onychomycosis of great toe      NEW MEDICATIONS STARTED DURING THIS VISIT:  ED Discharge Orders         Ordered    HYDROcodone-acetaminophen (NORCO/VICODIN) 5-325 MG tablet  Every 4 hours PRN     11/16/18 2056    meloxicam (MOBIC) 15 MG tablet  Daily     11/16/18 2056    omeprazole (PRILOSEC) 10 MG capsule  Daily     11/16/18 2056    Efinaconazole 10 % SOLN  Daily     11/16/18 2056              This chart was dictated using voice recognition software/Dragon. Despite best efforts to proofread, errors can occur which can change the meaning. Any change was purely unintentional.    Darletta Moll, PA-C 11/16/18 2057    Blake Divine, MD 11/17/18 [phone]

## 2018-11-16 NOTE — ED Notes (Signed)
See triage note. Pt c/o swelling and pain to R 2nd toe. States she has also previously broken R great toe. Arrives in post-op shoe too small for pt's foot. Pt states she has a better fitting one at home.

## 2018-11-16 NOTE — ED Triage Notes (Signed)
Pt injured right 2nd toe today at 12:33. Pt states she stubbed her toe, "jerked" forward also injuring mid back.

## 2018-11-28 ENCOUNTER — Emergency Department: Payer: 59

## 2018-11-28 ENCOUNTER — Emergency Department
Admission: EM | Admit: 2018-11-28 | Discharge: 2018-11-28 | Disposition: A | Discharge status: Home / Self Care | Payer: 59 | Attending: Emergency Medicine | Admitting: Emergency Medicine

## 2018-11-28 ENCOUNTER — Other Ambulatory Visit: Payer: Self-pay

## 2018-11-28 ENCOUNTER — Encounter: Payer: Self-pay | Admitting: Emergency Medicine

## 2018-11-28 DIAGNOSIS — R05 Cough: Secondary | ICD-10-CM | POA: Insufficient documentation

## 2018-11-28 DIAGNOSIS — Z20828 Contact with and (suspected) exposure to other viral communicable diseases: Secondary | ICD-10-CM | POA: Diagnosis not present

## 2018-11-28 DIAGNOSIS — Z79899 Other long term (current) drug therapy: Secondary | ICD-10-CM | POA: Insufficient documentation

## 2018-11-28 DIAGNOSIS — R300 Dysuria: Secondary | ICD-10-CM | POA: Insufficient documentation

## 2018-11-28 DIAGNOSIS — R509 Fever, unspecified: Secondary | ICD-10-CM | POA: Diagnosis present

## 2018-11-28 DIAGNOSIS — R5381 Other malaise: Secondary | ICD-10-CM | POA: Insufficient documentation

## 2018-11-28 DIAGNOSIS — B9789 Other viral agents as the cause of diseases classified elsewhere: Secondary | ICD-10-CM | POA: Diagnosis not present

## 2018-11-28 DIAGNOSIS — J069 Acute upper respiratory infection, unspecified: Secondary | ICD-10-CM | POA: Diagnosis not present

## 2018-11-28 DIAGNOSIS — R5383 Other fatigue: Secondary | ICD-10-CM | POA: Insufficient documentation

## 2018-11-28 DIAGNOSIS — R0602 Shortness of breath: Secondary | ICD-10-CM | POA: Diagnosis not present

## 2018-11-28 LAB — CBC WITH DIFFERENTIAL/PLATELET
Abs Immature Granulocytes: 0.01 10*3/uL (ref 0.00–0.07)
Basophils Absolute: 0.1 10*3/uL (ref 0.0–0.1)
Basophils Relative: 1 %
Eosinophils Absolute: 0.4 10*3/uL (ref 0.0–0.5)
Eosinophils Relative: 7 %
HCT: 36.7 % (ref 36.0–46.0)
Hemoglobin: 11.9 g/dL — ABNORMAL LOW (ref 12.0–15.0)
Immature Granulocytes: 0 %
Lymphocytes Relative: 35 %
Lymphs Abs: 2.1 10*3/uL (ref 0.7–4.0)
MCH: 28.3 pg (ref 26.0–34.0)
MCHC: 32.4 g/dL (ref 30.0–36.0)
MCV: 87.2 fL (ref 80.0–100.0)
Monocytes Absolute: 0.4 10*3/uL (ref 0.1–1.0)
Monocytes Relative: 7 %
Neutro Abs: 3 10*3/uL (ref 1.7–7.7)
Neutrophils Relative %: 50 %
Platelets: 254 10*3/uL (ref 150–400)
RBC: 4.21 MIL/uL (ref 3.87–5.11)
RDW: 13.3 % (ref 11.5–15.5)
WBC: 5.9 10*3/uL (ref 4.0–10.5)
nRBC: 0 % (ref 0.0–0.2)

## 2018-11-28 LAB — BASIC METABOLIC PANEL
Anion gap: 8 (ref 5–15)
BUN: 11 mg/dL (ref 6–20)
CO2: 25 mmol/L (ref 22–32)
Calcium: 9.2 mg/dL (ref 8.9–10.3)
Chloride: 106 mmol/L (ref 98–111)
Creatinine, Ser: 0.76 mg/dL (ref 0.44–1.00)
GFR calc Af Amer: >60 mL/min (ref >60)
GFR calc non Af Amer: >60 mL/min (ref >60)
Glucose, Bld: 86 mg/dL (ref 70–99)
Potassium: 3.8 mmol/L (ref 3.5–5.1)
Sodium: 139 mmol/L (ref 135–145)

## 2018-11-28 LAB — URINALYSIS, COMPLETE (UACMP) WITH MICROSCOPIC
Bacteria, UA: NONE SEEN
Bilirubin Urine: NEGATIVE
Glucose, UA: NEGATIVE mg/dL
Hgb urine dipstick: NEGATIVE
Ketones, ur: NEGATIVE mg/dL
Nitrite: NEGATIVE
Protein, ur: NEGATIVE mg/dL
Specific Gravity, Urine: 1.02 (ref 1.005–1.030)
pH: 5 (ref 5.0–8.0)

## 2018-11-28 LAB — WET PREP, GENITAL
Clue Cells Wet Prep HPF POC: NONE SEEN
Sperm: NONE SEEN
Trich, Wet Prep: NONE SEEN
Yeast Wet Prep HPF POC: NONE SEEN

## 2018-11-28 LAB — INFLUENZA PANEL BY PCR (TYPE A & B)
Influenza A By PCR: NEGATIVE
Influenza B By PCR: NEGATIVE

## 2018-11-28 LAB — SARS CORONAVIRUS 2 BY RT PCR (HOSPITAL ORDER, PERFORMED IN ~~LOC~~ HOSPITAL LAB): SARS Coronavirus 2: NEGATIVE

## 2018-11-28 MED ORDER — ALBUTEROL SULFATE HFA 108 (90 BASE) MCG/ACT IN AERS: 2.0000 | INHALATION_SPRAY | Freq: Four times a day (QID) | RESPIRATORY_TRACT | 0 refills | Status: DC | PRN

## 2018-11-28 MED ORDER — BENZONATATE 100 MG PO CAPS: ORAL_CAPSULE | ORAL | 0 refills | Status: DC

## 2018-11-28 MED ORDER — ONDANSETRON 4 MG PO TBDP: 4.0000 mg | ORAL_TABLET | Freq: Three times a day (TID) | ORAL | 0 refills | Status: DC | PRN

## 2018-11-28 NOTE — ED Triage Notes (Addendum)
Cough and generalized body aches x 3 days. Also states intermittent discomfort with urination x 1 week.

## 2018-11-28 NOTE — ED Notes (Signed)
Patient given warm blanket and chair to use as bedside table for phone. Patient denies any further needs at this time.

## 2018-11-28 NOTE — ED Notes (Signed)
Patient with multiple complaints. Reports dysuria x3-4 days. Also reports non-productive cough and generalized body aches. Reports pain with moving around in room.

## 2018-11-28 NOTE — ED Provider Notes (Signed)
[facility] Emergency Department Provider Note ____________________________________________  Time seen: 1929  I have reviewed the triage vital signs and the nursing notes.  HISTORY  Chief Complaint  Cough and Dysuria  HPI Jennifer Welch is a 58 y.o. female presents to the ED of multiple complaints. She reports "flu-like" symptoms since earlier in the week. She notes a sudden, subjective fever 2 days prior with sweating and chills. She has also had minimally productive cough, shortness of breath, and sinus congestion. She has been taking daily cetirizine and fluticasone. She is also endorsing malaise, fatigue, dysuria, and anorexia. She is without chest pain, abdominal pain, constipation, or diarrhea. She gives a history of recurrent UTIs.   Past Medical History:  Diagnosis Date  . Anxiety   . Depression     There are no active problems to display for this patient.   Past Surgical History:  Procedure Laterality Date  . ABDOMINAL HYSTERECTOMY    . BREAST SURGERY    . ECTOPIC PREGNANCY SURGERY    . FOOT SURGERY    . HAND TENDON SURGERY    . KNEE SURGERY    . OVARIAN CYST REMOVAL      Prior to Admission medications   Medication Sig Start Date End Date Taking? Authorizing Provider  gabapentin (NEURONTIN) 100 MG capsule Take 100 mg by mouth 3 (three) times daily.   Yes [provider]  promethazine (PHENERGAN) 25 MG tablet Take 25 mg by mouth every 8 (eight) hours as needed for nausea or vomiting.   Yes [provider]  albuterol (VENTOLIN HFA) 108 (90 Base) MCG/ACT inhaler Inhale 2 puffs into the lungs every 6 (six) hours as needed for shortness of breath. 11/28/18   Peityn Payton, Dannielle Karvonen, PA-C  benzonatate (TESSALON PERLES) 100 MG capsule Take 1-2 tabs TID prn cough 11/28/18   Reizy Dunlow, Dannielle Karvonen, PA-C  cetirizine (ZYRTEC) 10 MG tablet Take 10 mg by mouth daily.    [provider]  clonazePAM (KLONOPIN) 0.5 MG tablet Take 0.5  mg by mouth 3 (three) times daily as needed for anxiety.    [provider]  escitalopram (LEXAPRO) 20 MG tablet Take 20 mg by mouth daily.    [provider]  fluticasone (FLONASE) 50 MCG/ACT nasal spray Place 1 spray into both nostrils daily.    [provider]  ondansetron (ZOFRAN ODT) 4 MG disintegrating tablet Take 1 tablet (4 mg total) by mouth every 8 (eight) hours as needed. 11/28/18   Vaishnav Demartin, Dannielle Karvonen, PA-C  OXcarbazepine (TRILEPTAL) 150 MG tablet Take 150 mg by mouth 2 (two) times daily.    [provider]  pantoprazole (PROTONIX) 40 MG tablet Take 40 mg by mouth daily.    [provider]  pregabalin (LYRICA) 150 MG capsule Take 150 mg by mouth 2 (two) times daily.    [provider]  omeprazole (PRILOSEC) 10 MG capsule Take 1 capsule (10 mg total) by mouth daily. 11/16/18 11/28/18  Cuthriell, Charline Bills, PA-C    Allergies Macrobid [nitrofurantoin macrocrystal] and Septra [sulfamethoxazole-trimethoprim]  No family history on file.  Social History Social History   Tobacco Use  . Smoking status: Never Smoker  . Smokeless tobacco: Never Used  Substance Use Topics  . Alcohol use: Not Currently  . Drug use: Never   Review of Systems  Constitutional: Positive for fever. Eyes: Negative for visual changes. ENT: Negative for sore throat. Cardiovascular: Negative for chest pain. Respiratory: Positive for shortness of breath. Gastrointestinal:  Negative for abdominal pain, vomiting and diarrhea. Genitourinary: Positive for dysuria. Musculoskeletal: Negative for back pain. Skin: Negative for rash. Neurological: Negative for headaches, focal weakness or numbness. ____________________________________________  PHYSICAL EXAM:  VITAL SIGNS: ED Triage Vitals  Enc Vitals Group     BP 11/28/18 1839 (!) 141/65     Pulse Rate 11/28/18 1839 79     Resp 11/28/18 1839 18     Temp 11/28/18 1839 99.5 F (37.5 C)     Temp Source  11/28/18 1839 Oral     SpO2 11/28/18 1839 97 %     Weight 11/28/18 1841 184 lb (83.5 kg)     Height 11/28/18 1841 5' 7.5" (1.715 m)     Head Circumference --      Peak Flow --      Pain Score 11/28/18 1840 10     Pain Loc --      Pain Edu? --      Excl. in [place]? --     Constitutional: Alert and oriented. Well appearing and in no distress. Head: Normocephalic and atraumatic. Eyes: Conjunctivae are normal. PERRL. Normal extraocular movements Ears: Canals clear. TMs intact bilaterally. Nose: No congestion/rhinorrhea/epistaxis. Mouth/Throat: Mucous membranes are moist. No oral lesions.  Neck: Supple. No thyromegaly. Hematological/Lymphatic/Immunological: No cervical lymphadenopathy. Cardiovascular: Normal rate, regular rhythm. Normal distal pulses. Respiratory: Normal respiratory effort. No wheezes/rales/rhonchi. Gastrointestinal: Soft and nontender. No distention, rebound, guarding. No CVA tenderness. Musculoskeletal: Nontender with normal range of motion in all extremities.  Neurologic:  Normal gait without ataxia. Normal speech and language. No gross focal neurologic deficits are appreciated. Skin:  Skin is warm, dry and intact. No rash noted. ____________________________________________   LABS (pertinent positives/negatives) Labs Reviewed  WET PREP, GENITAL - Abnormal; Notable for the following components:      Result Value   WBC, Wet Prep HPF POC MANY (*)    All other components within normal limits  URINALYSIS, COMPLETE (UACMP) WITH MICROSCOPIC - Abnormal; Notable for the following components:   Color, Urine YELLOW (*)    APPearance CLEAR (*)    Leukocytes,Ua SMALL (*)    All other components within normal limits  CBC WITH DIFFERENTIAL/PLATELET - Abnormal; Notable for the following components:   Hemoglobin 11.9 (*)    All other components within normal limits  SARS CORONAVIRUS 2 (HOSPITAL ORDER, [facility])  URINE CULTURE  BASIC METABOLIC PANEL   INFLUENZA PANEL BY PCR (TYPE A & B)  ____________________________________________   RADIOLOGY  CXR  Negative ____________________________________________  PROCEDURES  Procedures ____________________________________________  INITIAL IMPRESSION / ASSESSMENT AND PLAN / ED COURSE  DDX: UTI, viral URI, COVID, CAP  Patient with ED evaluation of multiple symptoms.  Was overall benign reassuring at this time.  Labs do not indicate any acute dehydration, infectious process, or urinary tract infection.  A urine culture, is pending, given the patient's history of recurrent UTIs.  Chest x-ray does not show any acute infectious cardiopulmonary process.  Patient is reassured by her normal exam findings and will be treated empirically for viral etiology.  Cover test is pending so she will remain under quarantine until the result is available in the next 24 hours.  Prescription for Tessalon Perles, albuterol, and Zofran are provided for symptomatic relief.  She will continue with home medications as previously prescribed.  She is advised to return to the ED immediately for sharp increase and shortness of breath, chest pain, or fevers.  Janique Markoff was evaluated in Emergency Department on  11/28/2018 for the symptoms described in the history of present illness. She was evaluated in the context of the global COVID-19 pandemic, which necessitated consideration that the patient might be at risk for infection with the SARS-CoV-2 virus that causes COVID-19. Institutional protocols and algorithms that pertain to the evaluation of patients at risk for COVID-19 are in a state of rapid change based on information released by regulatory bodies including the CDC and federal and state organizations. These policies and algorithms were followed during the patient's care in the ED. ____________________________________________  FINAL CLINICAL IMPRESSION(S) / ED DIAGNOSES  Final diagnoses:  Viral URI with cough       Gisel Vipond, Dannielle Karvonen, PA-C 11/28/18 2311    Duffy Bruce, MD 11/29/18 [phone]

## 2018-11-28 NOTE — Discharge Instructions (Addendum)
You have test results for COVID pending at the time of discharge. All of your other test results are within normal limits. Take your home meds as well as the prescription meds for symptoms. Remain at home under quarantine until your results are available.

## 2018-11-30 LAB — URINE CULTURE: Culture: 20000 — AB

## 2018-12-07 ENCOUNTER — Other Ambulatory Visit: Payer: Self-pay | Admitting: Podiatry

## 2018-12-08 ENCOUNTER — Other Ambulatory Visit: Payer: Self-pay

## 2018-12-11 ENCOUNTER — Other Ambulatory Visit
Admission: RE | Admit: 2018-12-11 | Discharge: 2018-12-11 | Disposition: A | Discharge status: Home / Self Care | Payer: 59 | Source: Ambulatory Visit | Attending: Podiatry | Admitting: Podiatry

## 2018-12-11 DIAGNOSIS — Z01812 Encounter for preprocedural laboratory examination: Secondary | ICD-10-CM | POA: Diagnosis present

## 2018-12-11 DIAGNOSIS — Z20828 Contact with and (suspected) exposure to other viral communicable diseases: Secondary | ICD-10-CM | POA: Diagnosis not present

## 2018-12-12 LAB — SARS CORONAVIRUS 2 (TAT 6-24 HRS): SARS Coronavirus 2: NEGATIVE

## 2018-12-13 NOTE — Anesthesia Preprocedure Evaluation (Addendum)
Anesthesia Evaluation  Patient identified by MRN, date of birth, ID band Patient awake    Reviewed: Allergy & Precautions, NPO status , Patient's Chart, lab work & pertinent test results  History of Anesthesia Complications Negative for: history of anesthetic complications  Airway Mallampati: II   Neck ROM: Full    Dental  (+) Edentulous Upper, Edentulous Lower   Pulmonary neg pulmonary ROS,    Pulmonary exam normal breath sounds clear to auscultation       Cardiovascular Exercise Tolerance: Good negative cardio ROS Normal cardiovascular exam Rhythm:Regular Rate:Normal     Neuro/Psych PSYCHIATRIC DISORDERS Anxiety Depression Bipolar Disorder negative neurological ROS     GI/Hepatic negative GI ROS,   Endo/Other  negative endocrine ROS  Renal/GU negative Renal ROS     Musculoskeletal   Abdominal   Peds  Hematology negative hematology ROS (+)   Anesthesia Other Findings   Reproductive/Obstetrics                            Anesthesia Physical Anesthesia Plan  ASA: II  Anesthesia Plan: General   Post-op Pain Management:    Induction: Intravenous  PONV Risk Score and Plan: 3 and Propofol infusion and TIVA  Airway Management Planned: Natural Airway  Additional Equipment:   Intra-op Plan:   Post-operative Plan:   Informed Consent: I have reviewed the patients History and Physical, chart, labs and discussed the procedure including the risks, benefits and alternatives for the proposed anesthesia with the patient or authorized representative who has indicated his/her understanding and acceptance.       Plan Discussed with: CRNA  Anesthesia Plan Comments:        Anesthesia Quick Evaluation

## 2018-12-15 ENCOUNTER — Ambulatory Visit
Admission: RE | Admit: 2018-12-15 | Discharge: 2018-12-15 | Disposition: A | Discharge status: Home / Self Care | Payer: 59 | Attending: Podiatry | Admitting: Podiatry

## 2018-12-15 ENCOUNTER — Other Ambulatory Visit: Payer: Self-pay

## 2018-12-15 ENCOUNTER — Ambulatory Visit: Payer: 59 | Admitting: Anesthesiology

## 2018-12-15 ENCOUNTER — Encounter
Admission: RE | Disposition: A | Discharge status: Home / Self Care | Payer: Self-pay | Source: Home / Self Care | Attending: Podiatry

## 2018-12-15 DIAGNOSIS — Z888 Allergy status to other drugs, medicaments and biological substances status: Secondary | ICD-10-CM | POA: Insufficient documentation

## 2018-12-15 DIAGNOSIS — Z791 Long term (current) use of non-steroidal anti-inflammatories (NSAID): Secondary | ICD-10-CM | POA: Diagnosis not present

## 2018-12-15 DIAGNOSIS — K219 Gastro-esophageal reflux disease without esophagitis: Secondary | ICD-10-CM | POA: Diagnosis not present

## 2018-12-15 DIAGNOSIS — F419 Anxiety disorder, unspecified: Secondary | ICD-10-CM | POA: Insufficient documentation

## 2018-12-15 DIAGNOSIS — M19071 Primary osteoarthritis, right ankle and foot: Secondary | ICD-10-CM | POA: Diagnosis not present

## 2018-12-15 DIAGNOSIS — Z79899 Other long term (current) drug therapy: Secondary | ICD-10-CM | POA: Diagnosis not present

## 2018-12-15 DIAGNOSIS — S96821A Laceration of other specified muscles and tendons at ankle and foot level, right foot, initial encounter: Secondary | ICD-10-CM | POA: Insufficient documentation

## 2018-12-15 DIAGNOSIS — W2209XA Striking against other stationary object, initial encounter: Secondary | ICD-10-CM | POA: Insufficient documentation

## 2018-12-15 DIAGNOSIS — Z882 Allergy status to sulfonamides status: Secondary | ICD-10-CM | POA: Insufficient documentation

## 2018-12-15 DIAGNOSIS — S92531A Displaced fracture of distal phalanx of right lesser toe(s), initial encounter for closed fracture: Secondary | ICD-10-CM | POA: Diagnosis present

## 2018-12-15 DIAGNOSIS — Z87891 Personal history of nicotine dependence: Secondary | ICD-10-CM | POA: Insufficient documentation

## 2018-12-15 DIAGNOSIS — F319 Bipolar disorder, unspecified: Secondary | ICD-10-CM | POA: Diagnosis not present

## 2018-12-15 DIAGNOSIS — Z7982 Long term (current) use of aspirin: Secondary | ICD-10-CM | POA: Diagnosis not present

## 2018-12-15 DIAGNOSIS — Z881 Allergy status to other antibiotic agents status: Secondary | ICD-10-CM | POA: Diagnosis not present

## 2018-12-15 HISTORY — DX: Bipolar disorder, unspecified: F31.9

## 2018-12-15 HISTORY — DX: Myoneural disorder, unspecified: G70.9

## 2018-12-15 HISTORY — PX: BONE EXCISION: SHX6730

## 2018-12-15 SURGERY — BONE EXCISION
Anesthesia: General | Site: Foot | Laterality: Right

## 2018-12-15 MED ORDER — LIDOCAINE HCL 1 % IJ SOLN
INTRAMUSCULAR | Status: DC | PRN
  Administered 2018-12-15: 10 mL

## 2018-12-15 MED ORDER — POVIDONE-IODINE 7.5 % EX SOLN
Freq: Once | CUTANEOUS | Status: AC
  Administered 2018-12-15: 07:00:00 via TOPICAL

## 2018-12-15 MED ORDER — ONDANSETRON HCL 4 MG/2ML IJ SOLN
INTRAMUSCULAR | Status: DC | PRN
  Administered 2018-12-15: 4 mg via INTRAVENOUS

## 2018-12-15 MED ORDER — LACTATED RINGERS IV SOLN
10.0000 mL/h | INTRAVENOUS | Status: DC
  Administered 2018-12-15: 10 mL/h via INTRAVENOUS

## 2018-12-15 MED ORDER — KETOROLAC TROMETHAMINE 30 MG/ML IJ SOLN
INTRAMUSCULAR | Status: DC | PRN
  Administered 2018-12-15: 30 mg via INTRAVENOUS

## 2018-12-15 MED ORDER — CEFAZOLIN SODIUM-DEXTROSE 2-4 GM/100ML-% IV SOLN
2.0000 g | INTRAVENOUS | Status: AC
  Administered 2018-12-15: 2 g via INTRAVENOUS

## 2018-12-15 MED ORDER — GLYCOPYRROLATE 0.2 MG/ML IJ SOLN
INTRAMUSCULAR | Status: DC | PRN
  Administered 2018-12-15: 0.1 mg via INTRAVENOUS

## 2018-12-15 MED ORDER — CEPHALEXIN 500 MG PO CAPS: 500.0000 mg | ORAL_CAPSULE | Freq: Three times a day (TID) | ORAL | 0 refills | Status: AC

## 2018-12-15 MED ORDER — OXYCODONE HCL 5 MG/5ML PO SOLN: 5.0000 mg | Freq: Once | ORAL | Status: AC | PRN

## 2018-12-15 MED ORDER — OXYCODONE-ACETAMINOPHEN 7.5-325 MG PO TABS: 1.0000 | ORAL_TABLET | Freq: Four times a day (QID) | ORAL | 0 refills | Status: AC | PRN

## 2018-12-15 MED ORDER — ACETAMINOPHEN 10 MG/ML IV SOLN
INTRAVENOUS | Status: DC | PRN
  Administered 2018-12-15: 1000 mg via INTRAVENOUS

## 2018-12-15 MED ORDER — ONDANSETRON HCL 4 MG/2ML IJ SOLN: 4.0000 mg | Freq: Once | INTRAMUSCULAR | Status: DC | PRN

## 2018-12-15 MED ORDER — PROPOFOL 500 MG/50ML IV EMUL
INTRAVENOUS | Status: DC | PRN
  Administered 2018-12-15: 50 ug/kg/min via INTRAVENOUS

## 2018-12-15 MED ORDER — LACTATED RINGERS IV SOLN: INTRAVENOUS | Status: DC

## 2018-12-15 MED ORDER — MIDAZOLAM HCL 2 MG/2ML IJ SOLN
INTRAMUSCULAR | Status: DC | PRN
  Administered 2018-12-15: 2 mg via INTRAVENOUS

## 2018-12-15 MED ORDER — BUPIVACAINE HCL 0.5 % IJ SOLN
INTRAMUSCULAR | Status: DC | PRN
  Administered 2018-12-15: 10 mL

## 2018-12-15 MED ORDER — ONDANSETRON HCL 4 MG PO TABS: 4.0000 mg | ORAL_TABLET | Freq: Three times a day (TID) | ORAL | 0 refills | Status: AC | PRN

## 2018-12-15 MED ORDER — ACETAMINOPHEN 10 MG/ML IV SOLN: 1000.0000 mg | Freq: Once | INTRAVENOUS | Status: DC | PRN

## 2018-12-15 MED ORDER — OXYCODONE HCL 5 MG PO TABS
5.0000 mg | ORAL_TABLET | Freq: Once | ORAL | Status: AC | PRN
  Administered 2018-12-15: 5 mg via ORAL

## 2018-12-15 MED ORDER — FENTANYL CITRATE (PF) 100 MCG/2ML IJ SOLN: 25.0000 ug | INTRAMUSCULAR | Status: DC | PRN

## 2018-12-15 MED ORDER — FENTANYL CITRATE (PF) 100 MCG/2ML IJ SOLN
INTRAMUSCULAR | Status: DC | PRN
  Administered 2018-12-15 (×2): 50 ug via INTRAVENOUS

## 2018-12-15 MED ORDER — DEXAMETHASONE SODIUM PHOSPHATE 4 MG/ML IJ SOLN
INTRAMUSCULAR | Status: DC | PRN
  Administered 2018-12-15: 4 mg via INTRAVENOUS

## 2018-12-15 MED ORDER — LIDOCAINE HCL (CARDIAC) PF 100 MG/5ML IV SOSY
PREFILLED_SYRINGE | INTRAVENOUS | Status: DC | PRN
  Administered 2018-12-15: 60 mg via INTRAVENOUS

## 2018-12-15 SURGICAL SUPPLY — 27 items
BANDAGE ELASTIC 4 VELCRO NS (GAUZE/BANDAGES/DRESSINGS) ×2 IMPLANT
BNDG CONFORM 2 STRL LF (GAUZE/BANDAGES/DRESSINGS) ×1 IMPLANT
BNDG ESMARK 4X12 TAN STRL LF (GAUZE/BANDAGES/DRESSINGS) ×2 IMPLANT
BNDG GAUZE 4.5X4.1 6PLY STRL (MISCELLANEOUS) ×2 IMPLANT
CANISTER SUCT 1200ML W/VALVE (MISCELLANEOUS) ×2 IMPLANT
COVER LIGHT HANDLE FLEXIBLE (MISCELLANEOUS) ×4 IMPLANT
CUFF TOURN SGL QUICK 18X4 (TOURNIQUET CUFF) ×2 IMPLANT
DRAPE FLUOR MINI C-ARM 54X84 (DRAPES) ×2 IMPLANT
DURAPREP 26ML APPLICATOR (WOUND CARE) ×2 IMPLANT
ELECT REM PT RETURN 9FT ADLT (ELECTROSURGICAL) ×2
ELECTRODE REM PT RTRN 9FT ADLT (ELECTROSURGICAL) ×1 IMPLANT
GAUZE SPONGE 4X4 12PLY STRL (GAUZE/BANDAGES/DRESSINGS) ×2 IMPLANT
GAUZE XEROFORM 1X8 LF (GAUZE/BANDAGES/DRESSINGS) ×2 IMPLANT
GLOVE BIO SURGEON STRL SZ7 (GLOVE) ×3 IMPLANT
GLOVE BIOGEL PI IND STRL 7.0 (GLOVE) ×1 IMPLANT
GLOVE BIOGEL PI INDICATOR 7.0 (GLOVE) ×2
GOWN STRL REUS W/ TWL LRG LVL3 (GOWN DISPOSABLE) ×2 IMPLANT
GOWN STRL REUS W/TWL LRG LVL3 (GOWN DISPOSABLE) ×2
KIT TURNOVER KIT A (KITS) ×2 IMPLANT
NS IRRIG 500ML POUR BTL (IV SOLUTION) ×2 IMPLANT
PACK EXTREMITY ARMC (MISCELLANEOUS) ×2 IMPLANT
PENCIL SMOKE EVACUATOR (MISCELLANEOUS) ×2 IMPLANT
SUT ETHILON 4-0 (SUTURE) ×1
SUT ETHILON 4-0 FS2 18XMFL BLK (SUTURE) ×1
SUT VIC AB 3-0 SH 27 (SUTURE) ×1
SUT VIC AB 3-0 SH 27X BRD (SUTURE) IMPLANT
SUTURE ETHLN 4-0 FS2 18XMF BLK (SUTURE) IMPLANT

## 2018-12-15 NOTE — Anesthesia Postprocedure Evaluation (Signed)
Anesthesia Post Note  Patient: Jennifer Welch  Procedure(s) Performed: PARTIAL EXCISION BONE-PHALANX RIGHT WITH FLUOROSCOPY (Right Foot)  Patient location during evaluation: PACU Anesthesia Type: General Level of consciousness: awake and alert, oriented and patient cooperative Pain management: pain level controlled Vital Signs Assessment: post-procedure vital signs reviewed and stable Respiratory status: spontaneous breathing, nonlabored ventilation and respiratory function stable Cardiovascular status: blood pressure returned to baseline and stable Postop Assessment: adequate PO intake Anesthetic complications: no    Darrin Nipper

## 2018-12-15 NOTE — Transfer of Care (Signed)
Immediate Anesthesia Transfer of Care Note  Patient: Jennifer Welch  Procedure(s) Performed: PARTIAL EXCISION BONE-PHALANX RIGHT WITH FLUOROSCOPY (Right Foot)  Patient Location: PACU  Anesthesia Type: General  Level of Consciousness: awake, alert  and patient cooperative  Airway and Oxygen Therapy: Patient Spontanous Breathing and Patient connected to supplemental oxygen  Post-op Assessment: Post-op Vital signs reviewed, Patient's Cardiovascular Status Stable, Respiratory Function Stable, Patent Airway and No signs of Nausea or vomiting  Post-op Vital Signs: Reviewed and stable  Complications: No apparent anesthesia complications

## 2018-12-15 NOTE — Discharge Instructions (Signed)
[place] DR. TROXLER, DR. Vickki Muff, AND DR. Wickliffe   1. Take your medication as prescribed.  Pain medication should be taken only as needed.  Nausea and vomiting medication should be taken as needed only as well.  2. Keep the dressing clean, dry and intact until your first postoperative visit.  If issues with the dressing arise, please call office for instruction.  3. Keep your foot elevated above the heart level for the first 48 hours.  4. Walking to the bathroom and brief periods of walking are acceptable, unless we have instructed you to be non-weight bearing.  5. Always wear your post-op shoe when walking.  Try to stay off of your right foot as much as possible in a surgical shoe.  Ok to remove surgical shoe when resting in a chair or bed.  Never walk without the surgical shoe.  Walk with heel contact only if possible to the right foot.  6. Do not take a shower and get dressing wet. Baths are permissible as long as the foot is kept out of the water.   7. Every hour you are awake:  - Bend your knee 15 times. - Flex foot 15 times - Massage calf 15 times  8. Call [facility] [phone]) if any of the following problems occur: - You develop a temperature or fever. - The bandage becomes saturated with blood. - Medication does not stop your pain. - Injury of the foot occurs. - Any symptoms of infection including redness, odor, or red streaks running from wound.   General Anesthesia, Adult, Care After This sheet gives you information about how to care for yourself after your procedure. Your health care provider may also give you more specific instructions. If you have problems or questions, contact your health care provider. What can I expect after the procedure? After the procedure, the following side effects are common:  Pain or discomfort at the IV  site.  Nausea.  Vomiting.  Sore throat.  Trouble concentrating.  Feeling cold or chills.  Weak or tired.  Sleepiness and fatigue.  Soreness and body aches. These side effects can affect parts of the body that were not involved in surgery. Follow these instructions at home:  For at least 24 hours after the procedure:  Have a responsible adult stay with you. It is important to have someone help care for you until you are awake and alert.  Rest as needed.  Do not: ? Participate in activities in which you could fall or become injured. ? Drive. ? Use heavy machinery. ? Drink alcohol. ? Take sleeping pills or medicines that cause drowsiness. ? Make important decisions or sign legal documents. ? Take care of children on your own. Eating and drinking  Follow any instructions from your health care provider about eating or drinking restrictions.  When you feel hungry, start by eating small amounts of foods that are soft and easy to digest (bland), such as toast. Gradually return to your regular diet.  Drink enough fluid to keep your urine pale yellow.  If you vomit, rehydrate by drinking water, juice, or clear broth. General instructions  If you have sleep apnea, surgery and certain medicines can increase your risk for breathing problems. Follow instructions from your health care provider about wearing your sleep device: ? Anytime you are sleeping, including during daytime naps. ? While taking prescription pain medicines, sleeping medicines, or medicines that make  you drowsy.  Return to your normal activities as told by your health care provider. Ask your health care provider what activities are safe for you.  Take over-the-counter and prescription medicines only as told by your health care provider.  If you smoke, do not smoke without supervision.  Keep all follow-up visits as told by your health care provider. This is important. Contact a health care provider if:  You  have nausea or vomiting that does not get better with medicine.  You cannot eat or drink without vomiting.  You have pain that does not get better with medicine.  You are unable to pass urine.  You develop a skin rash.  You have a fever.  You have redness around your IV site that gets worse. Get help right away if:  You have difficulty breathing.  You have chest pain.  You have blood in your urine or stool, or you vomit blood. Summary  After the procedure, it is common to have a sore throat or nausea. It is also common to feel tired.  Have a responsible adult stay with you for the first 24 hours after general anesthesia. It is important to have someone help care for you until you are awake and alert.  When you feel hungry, start by eating small amounts of foods that are soft and easy to digest (bland), such as toast. Gradually return to your regular diet.  Drink enough fluid to keep your urine pale yellow.  Return to your normal activities as told by your health care provider. Ask your health care provider what activities are safe for you. This information is not intended to replace advice given to you by your health care provider. Make sure you discuss any questions you have with your health care provider. Document Released: 06/10/2000 Document Revised: 03/07/2017 Document Reviewed: 10/18/2016 Elsevier Patient Education  2020 [employer].

## 2018-12-15 NOTE — H&P (Signed)
HISTORY AND PHYSICAL INTERVAL NOTE:  12/15/2018  7:23 AM  Jennifer Welch  has presented today for surgery, with the diagnosis of S92.531A CLOSED DISPLACED FRACTURE DISTAL PHALANX LESSER TOE RIGHT [id number] LACERATION OF EXTENSOR TENDON RIGHT FOOT.  The various methods of treatment have been discussed with the patient.  No guarantees were given.  After consideration of risks, benefits and other options for treatment, the patient has consented to surgery.  I have reviewed the patients' chart and labs.    Procedure: Right second toe fracture fragment resection with extensor tendon repair   A history and physical examination was performed in my office.  The patient was reexamined.  There have been no changes to this history and physical examination.  Caroline More

## 2018-12-15 NOTE — Anesthesia Procedure Notes (Signed)
Performed by: Silvana Newness, CRNA

## 2018-12-15 NOTE — Op Note (Signed)
PODIATRY / FOOT AND ANKLE SURGERY OPERATIVE REPORT    SURGEON: Caroline More, DPM  PRE-OPERATIVE DIAGNOSIS:  1.  Right distal phalanx second toe displaced avulsion fracture 2.  Right foot extensor tendon second toe DIPJ displacement  POST-OPERATIVE DIAGNOSIS: Same  PROCEDURE(S): 1. Right foot resection of displaced avulsion fracture at the dorsal aspect of the distal phalanx second toe with reattachment of extensor tendon  HEMOSTASIS: Right ankle tourniquet  ANESTHESIA: MAC  ESTIMATED BLOOD LOSS: 2 cc  FINDING(S): 1.  Right second toe distal phalanx displaced avulsion type fracture dorsally  PATHOLOGY/SPECIMEN(S): None taken  INDICATIONS:   Jennifer Welch is a 58 y.o. female who presents with an injury to the right second toe distal phalanx.  Patient states that about a week to 2 weeks ago she sustained an injury and has been having toe pain ever since.  An x-ray was taken showing a displaced avulsion type fracture over the dorsal aspect of the DIPJ of the right second toe.  This avulsion fracture looks as though it is pulled off a small piece of the distal phalanx dorsally which is nondisplaced.  Patient has exhausted all conservative therapies consisting of buddy taping and usage of certain a surgical shoe.  Discussed all treatment options with the patient both conservative and surgical attempts at correction at this time the patient has elected for surgery consisting of resection of avulsion type fragment at the dorsal aspect of the distal phalanx with reapproximation of the extensor tendon right second toe.  DESCRIPTION: After obtaining full informed written consent, the patient was brought back to the operating room and placed supine upon the operating table.  The patient received IV antibiotics prior to induction.  After obtaining adequate anesthesia, a preoperative block was performed with 10 cc of 1% lidocaine plain.  The patient was prepped and draped in the standard fashion.   An Esmarch bandage was used to exsanguinate the right lower extremity and pneumatic ankle tourniquet was inflated.  Attention was then directed to the dorsal aspect of the DIPJ of the right second toe where a linear longitudinal incision was made over this area.  The incision was deepened to the subcutaneous tissues utilizing sharp and blunt dissection.  Care was taken to identify and retract all vital neurovascular structures and all venous contributories were cauterized necessary.  At this time the fracture fragment was able to be visualized with a small avulsion present and partial detachment of the extensor tendon dorsally.  At this time a capsular and extensor type tenotomy type of incision was performed slightly distally to the avulsion type fracture at the level of the distal phalanx.  The extensor tendon was retracted dorsally and the fracture fragment was able to be visualized.  The fracture fragment was then resected with a combination of rongure and curette and was passed off in the operative site.  The DIPJ was then inspected and noted to have no cartilage damage present.  It appeared to have free range of motion with no crepitation.  The surgical site was flushed with copious amounts normal sterile saline.  The extensor tendon was then reapproximated well coapted to the distal phalanx remnant extensor tendon tissue and periosteum utilizing 3-0 Vicryl.  The skin was then reapproximated well coapted with 3-0 nylon horizontal mattress type stitching.  An additional 10 cc of half percent Marcaine plain was injected about the right second toe for a postoperative block.  A dressing was then applied consisting of Xeroform followed by 4 x 4 gauze,  Kling, Kerlix, and Ace wrap.  Prior to placing the postoperative dressing the pneumatic ankle tourniquet was deflated and a prompt hyperemic response was noted all digits of the right foot.  The patient tolerated the procedure and anesthesia well and transferred  to recovery room fossae and stable and vascular status intact to all toes the right foot.  Following.  Postoperative monitoring the patient be discharged home with the following written and oral postop instructions: Keep surgical dressings clean, dry, and intact, take pain medication, antibiotics, and antinausea medicine as prescribed to prescribed, remain partial weightbearing to the right foot in surgical shoe at all times with heel contact only, ice and elevate right lower extremity when at rest, and follow-up in clinic within 1 week of surgical date.  COMPLICATIONS: None  CONDITION: Good, stable  Caroline More

## 2019-02-09 DIAGNOSIS — F411 Generalized anxiety disorder: Secondary | ICD-10-CM | POA: Insufficient documentation

## 2019-02-09 DIAGNOSIS — I10 Essential (primary) hypertension: Secondary | ICD-10-CM

## 2019-02-09 DIAGNOSIS — F319 Bipolar disorder, unspecified: Secondary | ICD-10-CM | POA: Insufficient documentation

## 2019-02-09 HISTORY — DX: Essential (primary) hypertension: I10

## 2019-02-19 DIAGNOSIS — M479 Spondylosis, unspecified: Secondary | ICD-10-CM | POA: Insufficient documentation

## 2019-03-29 ENCOUNTER — Other Ambulatory Visit: Payer: Self-pay

## 2019-03-29 ENCOUNTER — Emergency Department
Admission: EM | Admit: 2019-03-29 | Discharge: 2019-03-29 | Disposition: A | Discharge status: Home / Self Care | Payer: Medicare Other | Attending: Emergency Medicine | Admitting: Emergency Medicine

## 2019-03-29 ENCOUNTER — Encounter: Payer: Self-pay | Admitting: Emergency Medicine

## 2019-03-29 DIAGNOSIS — Z20822 Contact with and (suspected) exposure to covid-19: Secondary | ICD-10-CM | POA: Diagnosis not present

## 2019-03-29 DIAGNOSIS — R05 Cough: Secondary | ICD-10-CM | POA: Insufficient documentation

## 2019-03-29 DIAGNOSIS — Z79899 Other long term (current) drug therapy: Secondary | ICD-10-CM | POA: Diagnosis not present

## 2019-03-29 DIAGNOSIS — R52 Pain, unspecified: Secondary | ICD-10-CM | POA: Diagnosis present

## 2019-03-29 DIAGNOSIS — M791 Myalgia, unspecified site: Secondary | ICD-10-CM

## 2019-03-29 DIAGNOSIS — R059 Cough, unspecified: Secondary | ICD-10-CM

## 2019-03-29 LAB — URINALYSIS, COMPLETE (UACMP) WITH MICROSCOPIC
Bacteria, UA: NONE SEEN
Bilirubin Urine: NEGATIVE
Glucose, UA: NEGATIVE mg/dL
Ketones, ur: NEGATIVE mg/dL
Nitrite: NEGATIVE
Protein, ur: NEGATIVE mg/dL
Specific Gravity, Urine: 1.013 (ref 1.005–1.030)
pH: 5 (ref 5.0–8.0)

## 2019-03-29 MED ORDER — SODIUM CHLORIDE 0.9 % IV BOLUS
1000.0000 mL | Freq: Once | INTRAVENOUS | Status: AC
  Administered 2019-03-29: 1000 mL via INTRAVENOUS

## 2019-03-29 MED ORDER — KETOROLAC TROMETHAMINE 10 MG PO TABS: 10.0000 mg | ORAL_TABLET | Freq: Three times a day (TID) | ORAL | 0 refills | Status: DC | PRN

## 2019-03-29 MED ORDER — KETOROLAC TROMETHAMINE 30 MG/ML IJ SOLN
30.0000 mg | Freq: Once | INTRAMUSCULAR | Status: AC
  Administered 2019-03-29: 30 mg via INTRAVENOUS
  Filled 2019-03-29: qty 1

## 2019-03-29 MED ORDER — BENZONATATE 100 MG PO CAPS: 100.0000 mg | ORAL_CAPSULE | Freq: Four times a day (QID) | ORAL | 0 refills | Status: AC | PRN

## 2019-03-29 MED ORDER — ONDANSETRON HCL 4 MG PO TABS: 4.0000 mg | ORAL_TABLET | Freq: Three times a day (TID) | ORAL | 0 refills | Status: DC | PRN

## 2019-03-29 NOTE — ED Provider Notes (Signed)
[facility] Emergency Department Provider Note   ____________________________________________   I have reviewed the triage vital signs and the nursing notes.   HISTORY  Chief Complaint Flu like symptoms  History limited by: Not Limited   HPI Jennifer Welch is a 59 y.o. female who presents to the emergency department today because of concern for flu like symptoms. Patient states symptoms started last week when she had some vomiting and diarrhea. She felt like she was getting better but recently has had more flu like symptoms including muscle aches (worse in back and left arm), congestion, cough and fevers. Has taken her temperature at home with a thermometer which read 40 degress C. She has no known direct covid contacts although works at [employer]. She also has concerns that she might have a urinary tract infection. She has been having some burning with urination as well.   Records reviewed.   Past Medical History:  Diagnosis Date  . Anxiety   . Bipolar 1 disorder ([place])   . Depression   . Neuromuscular disorder ([place])    CERVICAL DDD    There are no problems to display for this patient.   Past Surgical History:  Procedure Laterality Date  . ABDOMINAL HYSTERECTOMY    . BONE EXCISION Right 12/15/2018   Procedure: PARTIAL EXCISION BONE-PHALANX RIGHT WITH FLUOROSCOPY;  Surgeon: Caroline More, DPM;  Location: [place];  Service: Podiatry;  Laterality: Right;  MAC ANESTHESIA  . BREAST SURGERY    . ECTOPIC PREGNANCY SURGERY    . FOOT SURGERY    . HAND TENDON SURGERY    . KNEE SURGERY    . OVARIAN CYST REMOVAL      Prior to Admission medications   Medication Sig Start Date End Date Taking? Authorizing Provider  albuterol (VENTOLIN HFA) 108 (90 Base) MCG/ACT inhaler Inhale 2 puffs into the lungs every 6 (six) hours as needed for shortness of breath. 11/28/18   Menshew, Dannielle Karvonen, PA-C  benzonatate (TESSALON PERLES) 100 MG capsule Take 1-2  tabs TID prn cough 11/28/18   Menshew, Dannielle Karvonen, PA-C  cetirizine (ZYRTEC) 10 MG tablet Take 10 mg by mouth daily.    [provider]  clonazePAM (KLONOPIN) 0.5 MG tablet Take 0.5 mg by mouth 3 (three) times daily as needed for anxiety.    [provider]  escitalopram (LEXAPRO) 20 MG tablet Take 20 mg by mouth daily.    [provider]  fluticasone (FLONASE) 50 MCG/ACT nasal spray Place 1 spray into both nostrils daily.    [provider]  gabapentin (NEURONTIN) 100 MG capsule Take 100 mg by mouth 3 (three) times daily.    [provider]  ondansetron (ZOFRAN ODT) 4 MG disintegrating tablet Take 1 tablet (4 mg total) by mouth every 8 (eight) hours as needed. 11/28/18   Menshew, Dannielle Karvonen, PA-C  OXcarbazepine (TRILEPTAL) 150 MG tablet Take 150 mg by mouth 2 (two) times daily.    [provider]  pantoprazole (PROTONIX) 40 MG tablet Take 40 mg by mouth daily.    [provider]  pregabalin (LYRICA) 150 MG capsule Take 150 mg by mouth 2 (two) times daily.    [provider]  promethazine (PHENERGAN) 25 MG tablet Take 25 mg by mouth every 8 (eight) hours as needed for nausea or vomiting.    [provider]  omeprazole (PRILOSEC) 10 MG capsule Take 1 capsule (10 mg total) by mouth daily. 11/16/18 11/28/18  Cuthriell,  Charline Bills, PA-C    Allergies Macrobid [nitrofurantoin macrocrystal] and Septra [sulfamethoxazole-trimethoprim]  No family history on file.  Social History Social History   Tobacco Use  . Smoking status: Never Smoker  . Smokeless tobacco: Never Used  Substance Use Topics  . Alcohol use: Not Currently  . Drug use: Never    Review of Systems Constitutional: Positive for fever Eyes: No visual changes. ENT: Positive for congestion Cardiovascular: Denies chest pain. Respiratory: Positive for cough Gastrointestinal: Positive for nausea, vomiting and diarrhea.   Genitourinary: Positive for  dysuria. Musculoskeletal: Positive for headaches.  Skin: Negative for rash. Neurological: Negative for focal weakness or numbness.  ____________________________________________   PHYSICAL EXAM:  VITAL SIGNS: ED Triage Vitals  Enc Vitals Group     BP 164/76     Pulse 64     Resp 14     Temp 97.8     Temp src      SpO2 100    Constitutional: Alert and oriented.  Eyes: Conjunctivae are normal.  ENT      Head: Normocephalic and atraumatic.      Nose: No congestion/rhinnorhea.      Mouth/Throat: Mucous membranes are moist.      Neck: No stridor. Hematological/Lymphatic/Immunilogical: No cervical lymphadenopathy. Cardiovascular: Normal rate, regular rhythm.  No murmurs, rubs, or gallops.  Respiratory: Normal respiratory effort without tachypnea nor retractions. Breath sounds are clear and equal bilaterally. No wheezes/rales/rhonchi. Gastrointestinal: Soft and non tender. No rebound. No guarding.  Genitourinary: Deferred Musculoskeletal: Normal range of motion in all extremities. No lower extremity edema. Neurologic:  Normal speech and language. No gross focal neurologic deficits are appreciated.  Skin:  Skin is warm, dry and intact. No rash noted. Psychiatric: Mood and affect are normal. Speech and behavior are normal. Patient exhibits appropriate insight and judgment.  ____________________________________________    LABS (pertinent positives/negatives)  UA clear, small hgb dipstick, trace leukocytes, 0-5 rbc and wbc  ____________________________________________   EKG  None  ____________________________________________    RADIOLOGY  None  ____________________________________________   PROCEDURES  Procedures  ____________________________________________   INITIAL IMPRESSION / ASSESSMENT AND PLAN / ED COURSE  Pertinent labs & imaging results that were available during my care of the patient were reviewed by me and considered in my medical decision making  (see chart for details).   Patient presented to the emergency department today with signs and symptoms concerning for COVID. Also had concern for possible UTI. Urine with trace leukocytes and small hgb dipstick but no other concerning findings. Will send for urine culture but I have low suspicion for infection at this time. In terms of the other symptoms I do have strong suspicion for COVID. Will send covid test. Lungs were clear, doubt pneumonia or focal disease. O2 saturation without concern today, although I did discuss with patient that this could change and she could by a pulse oximeter to monitor her levels at home.    ____________________________________________   FINAL CLINICAL IMPRESSION(S) / ED DIAGNOSES  Final diagnoses:  Suspected COVID-19 virus infection  Cough  Myalgia     Note: This dictation was prepared with Dragon dictation. Any transcriptional errors that result from this process are unintentional     Nance Pear, MD 03/29/19 1701

## 2019-03-29 NOTE — Discharge Instructions (Signed)
When you are taking the Toradol please do not take any over the counter pain medication except for Tylenol. Please seek medical attention for any high fevers, chest pain, shortness of breath, change in behavior, persistent vomiting, bloody stool or any other new or concerning symptoms.

## 2019-03-29 NOTE — ED Notes (Signed)
Says she has been sick since last week.  She says her ears feel like oil or water is in them and her eyes are crusty.  She is alert and in nad.

## 2019-03-29 NOTE — ED Triage Notes (Signed)
Says tired, body aches, back and shoulder pain since last week.

## 2019-03-29 NOTE — ED Notes (Signed)
Pt verbalizes understanding of d/c instructions, medications and follow up 

## 2019-03-31 LAB — URINE CULTURE: Culture: <10000 — AB

## 2019-04-03 LAB — NOVEL CORONAVIRUS, NAA (HOSP ORDER, SEND-OUT TO REF LAB; TAT 18-24 HRS): SARS-CoV-2, NAA: NOT DETECTED

## 2019-04-06 ENCOUNTER — Other Ambulatory Visit: Payer: Self-pay

## 2019-04-06 ENCOUNTER — Ambulatory Visit (INDEPENDENT_AMBULATORY_CARE_PROVIDER_SITE_OTHER): Payer: Medicare Other | Admitting: Psychiatry

## 2019-04-06 ENCOUNTER — Encounter: Payer: Self-pay | Admitting: Psychiatry

## 2019-04-06 DIAGNOSIS — F319 Bipolar disorder, unspecified: Secondary | ICD-10-CM

## 2019-04-06 DIAGNOSIS — F1421 Cocaine dependence, in remission: Secondary | ICD-10-CM | POA: Diagnosis not present

## 2019-04-06 DIAGNOSIS — F419 Anxiety disorder, unspecified: Secondary | ICD-10-CM | POA: Diagnosis not present

## 2019-04-06 MED ORDER — HYDROXYZINE HCL 25 MG PO TABS: 25.0000 mg | ORAL_TABLET | Freq: Three times a day (TID) | ORAL | 1 refills | Status: DC | PRN

## 2019-04-06 MED ORDER — QUETIAPINE FUMARATE 50 MG PO TABS: 50.0000 mg | ORAL_TABLET | Freq: Every day | ORAL | 0 refills | Status: DC
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
## 2019-04-13 ENCOUNTER — Ambulatory Visit (HOSPITAL_COMMUNITY): Payer: Medicare Other | Admitting: Clinical

## 2019-04-14 ENCOUNTER — Ambulatory Visit (INDEPENDENT_AMBULATORY_CARE_PROVIDER_SITE_OTHER): Payer: Medicare Other | Admitting: Clinical

## 2019-04-14 ENCOUNTER — Other Ambulatory Visit: Payer: Self-pay

## 2019-04-14 DIAGNOSIS — F418 Other specified anxiety disorders: Secondary | ICD-10-CM | POA: Diagnosis not present

## 2019-04-14 DIAGNOSIS — F1421 Cocaine dependence, in remission: Secondary | ICD-10-CM | POA: Diagnosis not present
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
## 2019-04-22 ENCOUNTER — Ambulatory Visit (INDEPENDENT_AMBULATORY_CARE_PROVIDER_SITE_OTHER): Payer: Medicare Other | Admitting: Psychiatry

## 2019-04-22 ENCOUNTER — Encounter: Payer: Self-pay | Admitting: Psychiatry

## 2019-04-22 ENCOUNTER — Other Ambulatory Visit: Payer: Self-pay

## 2019-04-22 DIAGNOSIS — F319 Bipolar disorder, unspecified: Secondary | ICD-10-CM

## 2019-04-22 DIAGNOSIS — F419 Anxiety disorder, unspecified: Secondary | ICD-10-CM

## 2019-04-22 DIAGNOSIS — F1421 Cocaine dependence, in remission: Secondary | ICD-10-CM

## 2019-04-22 MED ORDER — QUETIAPINE FUMARATE 25 MG PO TABS: 12.5000 mg | ORAL_TABLET | Freq: Every day | ORAL | 1 refills | Status: DC | PRN

## 2019-04-22 MED ORDER — QUETIAPINE FUMARATE 100 MG PO TABS: 100.0000 mg | ORAL_TABLET | Freq: Every day | ORAL | 1 refills | Status: DC

## 2019-04-22 MED ORDER — CLONAZEPAM 0.5 MG PO TABS: 0.5000 mg | ORAL_TABLET | ORAL | 0 refills | Status: DC

## 2019-04-22 NOTE — Progress Notes (Signed)
Provider Location : [facility] Patient Location : Home  Virtual Visit via Video Note  I connected with Jennifer Welch on 04/22/19 at  3:00 PM EST by a video enabled telemedicine application and verified that I am speaking with the correct person using two identifiers.   I discussed the limitations of evaluation and management by telemedicine and the availability of in person appointments. The patient expressed understanding and agreed to proceed.     I discussed the assessment and treatment plan with the patient. The patient was provided an opportunity to ask questions and all were answered. The patient agreed with the plan and demonstrated an understanding of the instructions.   The patient was advised to call back or seek an in-person evaluation if the symptoms worsen or if the condition fails to improve as anticipated.   [facility] MD OP Progress Note  04/22/2019 3:26 PM Jennifer Welch  MRN:  [id number]  Chief Complaint:  Chief Complaint    Follow-up     HPI: Jennifer Welch is a 59 year old African-American female who has a history of bipolar disorder, anxiety disorder, gastroesophageal reflux disease, IBS, recurrent lipomas, bilateral hand numbness, carpal tunnel was evaluated by telemedicine today.  Patient today reports she is currently struggling with a lot of situational stressors.  She got the news recently that her uncle has COVID-55.  She also reports 2 of her aunts have COVID-19.  Her oldest daughter's grandmother recently passed away.  She reports because of all these situational stressors she is feeling more depressed and anxious.  She reports she is gradually coming off of the Klonopin and is doing okay with it.  She does take hydroxyzine during the day which helps to some extent with her anxiety symptoms.  She currently denies any withdrawal symptoms from coming off of the clonazepam.  She does report sleep issues however Seroquel has been helpful.  She however is interested in increasing  the dosage of Seroquel.  Patient reports she had appointment with her psychotherapist Mr. Maye Hides.  She reports the therapy session went well and she looks forward to the next one.  Patient denies any suicidality, homicidality or perceptual disturbances.  Patient denies any other concerns today. Visit Diagnosis:    ICD-10-CM   1. Bipolar I disorder (HCC)  F31.9 clonazePAM (KLONOPIN) 0.5 MG tablet    QUEtiapine (SEROQUEL) 100 MG tablet    QUEtiapine (SEROQUEL) 25 MG tablet   unspecified  2. Anxiety disorder, unspecified type  F41.9 clonazePAM (KLONOPIN) 0.5 MG tablet    QUEtiapine (SEROQUEL) 25 MG tablet  3. Cocaine use disorder, moderate, in early remission ([place])  F14.21     Past Psychiatric History: I have reviewed past psychiatric history from my progress note on 04/06/2019.  Past trials of Seroquel-tolerated it well in the past, Trileptal, Prozac-side effects, Lexapro, Xanax, Klonopin, trazodone.  Past Medical History:  Past Medical History:  Diagnosis Date  . Anxiety   . Bipolar 1 disorder ([place])   . Depression   . Hypertensive disorder 02/09/2019  . Neuromuscular disorder [facility])    CERVICAL DDD    Past Surgical History:  Procedure Laterality Date  . ABDOMINAL HYSTERECTOMY    . BONE EXCISION Right 12/15/2018   Procedure: PARTIAL EXCISION BONE-PHALANX RIGHT WITH FLUOROSCOPY;  Surgeon: Caroline More, DPM;  Location: [facility];  Service: Podiatry;  Laterality: Right;  MAC ANESTHESIA  . BREAST SURGERY    . ECTOPIC PREGNANCY SURGERY    . FOOT SURGERY    . HAND TENDON SURGERY    .  KNEE SURGERY    . OVARIAN CYST REMOVAL      Family Psychiatric History: Reviewed family psychiatric history from my progress note on 04/06/2019.  Family History:  Family History  Problem Relation Age of Onset  . Diabetes Mother   . Alcohol abuse Father   . Colon cancer Father   . Alcohol abuse Brother   . Hypertension Brother     Social History: Reviewed social history from my  progress note on 04/06/2019. Social History   Socioeconomic History  . Marital status: Married    Spouse name: darwin Veach  . Number of children: 3  . Years of education: Not on file  . Highest education level: Some college, no degree  Occupational History  . Not on file  Tobacco Use  . Smoking status: Former Smoker    Types: Cigarettes  . Smokeless tobacco: Never Used  Substance and Sexual Activity  . Alcohol use: Not Currently  . Drug use: Never  . Sexual activity: Not Currently  Other Topics Concern  . Not on file  Social History Narrative  . Not on file   Social Determinants of Health   Financial Resource Strain:   . Difficulty of Paying Living Expenses: Not on file  Food Insecurity:   . Worried About Charity fundraiser in the Last Year: Not on file  . Ran Out of Food in the Last Year: Not on file  Transportation Needs:   . Lack of Transportation (Medical): Not on file  . Lack of Transportation (Non-Medical): Not on file  Physical Activity:   . Days of Exercise per Week: Not on file  . Minutes of Exercise per Session: Not on file  Stress:   . Feeling of Stress : Not on file  Social Connections:   . Frequency of Communication with Friends and Family: Not on file  . Frequency of Social Gatherings with Friends and Family: Not on file  . Attends Religious Services: Not on file  . Active Member of Clubs or Organizations: Not on file  . Attends Archivist Meetings: Not on file  . Marital Status: Not on file    Allergies:  Allergies  Allergen Reactions  . Phentermine Other (See Comments)    Vivid dreams/DRUG INTERACTION NOT ALLERGIC Vivid dreams/DRUG INTERACTION NOT ALLERGIC   . Quinolones Nausea Only and Other (See Comments)  . Macrobid [Nitrofurantoin Macrocrystal] Rash  . Septra [Sulfamethoxazole-Trimethoprim] Rash    Metabolic Disorder Labs: No results found for: HGBA1C, MPG No results found for: PROLACTIN No results found for: CHOL, TRIG,  HDL, CHOLHDL, VLDL, LDLCALC No results found for: TSH  Therapeutic Level Labs: No results found for: LITHIUM No results found for: VALPROATE No components found for:  CBMZ  Current Medications: Current Outpatient Medications  Medication Sig Dispense Refill  . ascorbic acid (VITAMIN C) 1000 MG tablet Take by mouth.    . benzonatate (TESSALON PERLES) 100 MG capsule Take 1 capsule (100 mg total) by mouth every 6 (six) hours as needed for cough. 30 capsule 0  . Cholecalciferol 25 MCG (1000 UT) tablet Take by mouth.    Derrill Memo ON 05/09/2019] clonazePAM (KLONOPIN) 0.5 MG tablet Take 1 tablet (0.5 mg total) by mouth as directed. Take half to one tablet daily as needed for anxiety attacks 21 tablet 0  . diclofenac Sodium (VOLTAREN) 1 % GEL Apply topically.    Marland Kitchen escitalopram (LEXAPRO) 10 MG tablet escitalopram 10 mg tablet  TAKE 1 TABLET BY MOUTH NIGHTLY    .  fexofenadine (ALLEGRA) 180 MG tablet Take by mouth.    . gabapentin (NEURONTIN) 100 MG capsule Take 100 mg by mouth 3 (three) times daily.    . hydrOXYzine (ATARAX/VISTARIL) 25 MG tablet Take 1 tablet (25 mg total) by mouth 3 (three) times daily as needed for anxiety. For severe anxiety symptoms 90 tablet 1  . ibuprofen (ADVIL) 200 MG tablet Take by mouth.    Marland Kitchen ketorolac (TORADOL) 10 MG tablet Take 1 tablet (10 mg total) by mouth every 8 (eight) hours as needed for severe pain. 20 tablet 0  . meloxicam (MOBIC) 15 MG tablet meloxicam 15 mg tablet    . naloxone (NARCAN) 4 MG/0.1ML LIQD nasal spray kit Narcan 4 mg/actuation nasal spray    . Omega-3 1000 MG CAPS Take by mouth.    . ondansetron (ZOFRAN) 4 MG tablet Take 1 tablet (4 mg total) by mouth every 8 (eight) hours as needed for nausea or vomiting. 20 tablet 0  . oxcarbazepine (TRILEPTAL) 600 MG tablet Take 600 mg by mouth daily.    Marland Kitchen oxyCODONE-acetaminophen (PERCOCET) 7.5-325 MG tablet oxycodone-acetaminophen 7.5 mg-325 mg tablet  TAKE 1 TABLET BY MOUTH EVERY 6 HOURS AS NEEDED FOR UP TO 7  DAYS FOR SEVERE PAIN    . pantoprazole (PROTONIX) 40 MG tablet Take 40 mg by mouth daily.    . QUEtiapine (SEROQUEL) 100 MG tablet Take 1 tablet (100 mg total) by mouth at bedtime. 30 tablet 1  . QUEtiapine (SEROQUEL) 25 MG tablet Take 0.5-1 tablets (12.5-25 mg total) by mouth daily as needed. For severe anxiety and agitation 30 tablet 1  . tiZANidine (ZANAFLEX) 2 MG tablet tizanidine 2 mg tablet  TAKE 1 TABLET BY MOUTH THREE TIMES DAILY    . traMADol (ULTRAM) 50 MG tablet tramadol 50 mg tablet  TAKE 1 TABLET BY MOUTH EVERY 6 HOURS AS NEEDED FOR PAIN     No current facility-administered medications for this visit.     Musculoskeletal: Strength & Muscle Tone: UTA Gait & Station: Observed as seated Patient leans: N/A  Psychiatric Specialty Exam: Review of Systems  Psychiatric/Behavioral: Positive for dysphoric mood and sleep disturbance. The patient is nervous/anxious.   All other systems reviewed and are negative.   There were no vitals taken for this visit.There is no height or weight on file to calculate BMI.  General Appearance: Casual  Eye Contact:  Fair  Speech:  Clear and Coherent  Volume:  Normal  Mood:  Anxious and Depressed  Affect:  Congruent  Thought Process:  Goal Directed and Descriptions of Associations: Intact  Orientation:  Full (Time, Place, and Person)  Thought Content: Logical   Suicidal Thoughts:  No  Homicidal Thoughts:  No  Memory:  Immediate;   Fair Recent;   Fair Remote;   Fair  Judgement:  Fair  Insight:  Fair  Psychomotor Activity:  Normal  Concentration:  Concentration: Fair and Attention Span: Fair  Recall:  [employer] of Knowledge: Fair  Language: Fair  Akathisia:  No  Handed:  Right  AIMS (if indicated): denies tremors, rigidity  Assets:  Communication Skills Desire for Improvement Housing Social Support Talents/Skills Transportation  ADL's:  Intact  Cognition: WNL  Sleep:  restless   Screenings:   Assessment and Plan: Starleen  is a 59 year old African-American female, married, on disability, employed part-time, lives in [place] with her husband, has a history of bipolar disorder, anxiety disorder, IBS, multiple surgeries, gastroesophageal reflux disease, carpal tunnel syndrome, chronic back pain was evaluated  by telemedicine today.  Patient is biologically predisposed given her history of trauma, substance abuse problems.  She also has multiple medical problems, physical limitations.  She does have recent psychosocial stressors of COVID-19 infection of several family members and death in the family.  Patient will continue to benefit from medication readjustment and psychotherapy sessions.  Plan as noted below.  Plan Bipolar disorder unspecified-unstable Trileptal 600 mg p.o. daily Lexapro 10 mg p.o. daily Increase Seroquel to 100 mg p.o. nightly. Add Seroquel 25 mg p.o. daily as needed for agitation/anxiety  Anxiety disorder unspecified-improving Continue to wean of Klonopin. Discussed with patient to reduce Klonopin to 1 mg daily every day next week. The week after that she will continue Klonopin 1 mg p.o. daily. Week 3 and week 4 she will take Klonopin half tablet daily. Continue hydroxyzine 25 mg p.o. twice daily as needed for severe anxiety.  Cocaine use disorder in early remission-we will monitor closely.  We will consider referral to IOP of substance abuse treatment if she relapses.  Pending records from previous psychiatrist.  Follow-up in clinic in 4 weeks or sooner if needed.  March 4 at 3:30 PM.  Patient advised to continue to follow-up with her therapist Mr. Maye Hides.  I have spent atleast 20 minutes non face to face with patient today. More than 50 % of the time was spent for preparing to see the patient ( e.g., review of test, records ),  ordering medications and test ,psychoeducation and supportive psychotherapy and care coordination,as well as documenting clinical information in electronic  health record. This note was generated in part or whole with voice recognition software. Voice recognition is usually quite accurate but there are transcription errors that can and very often do occur. I apologize for any typographical errors that were not detected and corrected.       Ursula Alert, MD 04/22/2019, 3:26 PM

## 2019-05-04 ENCOUNTER — Telehealth: Payer: Self-pay

## 2019-05-04 ENCOUNTER — Ambulatory Visit (INDEPENDENT_AMBULATORY_CARE_PROVIDER_SITE_OTHER): Payer: Medicare Other | Admitting: Clinical

## 2019-05-04 ENCOUNTER — Other Ambulatory Visit: Payer: Self-pay

## 2019-05-04 DIAGNOSIS — F319 Bipolar disorder, unspecified: Secondary | ICD-10-CM

## 2019-05-04 DIAGNOSIS — F418 Other specified anxiety disorders: Secondary | ICD-10-CM

## 2019-05-04 DIAGNOSIS — F1421 Cocaine dependence, in remission: Secondary | ICD-10-CM

## 2019-05-04 MED ORDER — OLANZAPINE 5 MG PO TABS: 5.0000 mg | ORAL_TABLET | Freq: Every day | ORAL | 0 refills | Status: DC

## 2019-05-04 NOTE — Telephone Encounter (Signed)
pt states she not doing well she is not sleeping and she is shacking so bad. she is at her daughter house because she had surgey.

## 2019-05-04 NOTE — Telephone Encounter (Signed)
Returned call to patient.  She reports she is staying with her daughter who had surgery to help her out.  She reports she is having a lot of anxiety and is not able to sleep at night.  The Seroquel does not help her to sleep.  She also reports she has tried trazodone in the past did not work.  Discussed stopping Seroquel.  Start Zyprexa 5 mg at bedtime for sleep and mood.  Patient advised to continue to wean off clonazepam.

## 2019-05-04 NOTE — Progress Notes (Signed)
Virtual Visit via Video Note  I connected with Jennifer Welch on 05/04/19 at  3:00 PM EST by a video enabled telemedicine application and verified that I am speaking with the correct person using two identifiers.  Location: Patient: Home  Provider: Office   I discussed the limitations of evaluation and management by telemedicine and the availability of in person appointments. The patient expressed understanding and agreed to proceed.    THERAPIST PROGRESS NOTE  Session Time: 3:00PM-3:55PM  Participation Level: Active  Behavioral Response: CasualAlertAnxious  Type of Therapy: Individual Therapy  Treatment Goals addressed: Anxiety and Depression  Interventions: CBT  Summary: Jennifer Welch is a 59 y.o. female who presents with Depression and Anxiety. The OPT therapist worked with the patient for her initial session. The OPT therapist utilized Motivational Interviewing to assist in creating therapeutic repore. The patient in the session was engaged and work in collaboration giving feedback about her triggers and symptoms over the past few weeks including her partners substance use being a negative influencing factor. The OPT therapist utilized Cognitive Behavioral Therapy through cognitive restructuring as well as worked with the patient on coping strategies to assist in management of Anxiety and Depression. The OPT therapist inquired for holistic care about the patients adherence to medication therapy.  Suicidal/Homicidal: Nowithout intent/plan  Therapist Response: The OPT therapist worked with the patient for the patients initial scheduled session. The patient was engaged in her session and gave feedback in relation to triggers, symptoms, and behavior responses over the past 2 weeks. The OPT therapist worked with the patient utilizing an in session Cognitive Behavioral Therapy exercise. The patient was responsive in the session and verbalized, " I am willing to change my environment so I  do not have temptation to use again". The OPT therapist will continue treatment work with the patient in her next scheduled session.  Plan: Return again in 2 weeks.  Diagnosis: Axis I: Depression with anxiety and Cocaine use disorder, moderate, in early remission    Axis II: No diagnosis  I discussed the assessment and treatment plan with the patient. The patient was provided an opportunity to ask questions and all were answered. The patient agreed with the plan and demonstrated an understanding of the instructions.   The patient was advised to call back or seek an in-person evaluation if the symptoms worsen or if the condition fails to improve as anticipated.  I provided 55 minutes of non-face-to-face time during this encounter.   Lennox Grumbles, LCSW 05/04/2019

## 2019-05-05 ENCOUNTER — Telehealth: Payer: Self-pay

## 2019-05-05 NOTE — Telephone Encounter (Signed)
Returned call to patient.  She reports she forgot to tell writer yesterday that she is having memory problems.  Discussed with patient she already has a neurologist-Dr. Starleen Blue.  Advised her to contact Dr. Melrose Nakayama for evaluation.  Patient also reports that she wants to get into a rehabilitation facility.  Discussed with patient to contact her health insurance plan to get a list of rehab facilities and she has to call them for an intake eval.

## 2019-05-05 NOTE — Telephone Encounter (Signed)
pt called left a message that she is having memory issues and she very concern and that she needs to speak with you about her medications.

## 2019-05-06 ENCOUNTER — Telehealth: Payer: Self-pay

## 2019-05-06 DIAGNOSIS — F319 Bipolar disorder, unspecified: Secondary | ICD-10-CM

## 2019-05-06 DIAGNOSIS — F419 Anxiety disorder, unspecified: Secondary | ICD-10-CM

## 2019-05-06 NOTE — Telephone Encounter (Signed)
Medication refill request - Pt left a message requesting Dr. Shea Evans send in a new Clonazepam order to her [facility] in [place] and stop the one sent to [facility]. Stated Dr. Shea Evans knows the situation and why needing the order sent there

## 2019-05-07 MED ORDER — CLONAZEPAM 0.5 MG PO TABS: 0.5000 mg | ORAL_TABLET | ORAL | 0 refills | Status: DC

## 2019-05-07 MED ORDER — OLANZAPINE 10 MG PO TABS: 10.0000 mg | ORAL_TABLET | Freq: Every day | ORAL | 1 refills | Status: DC

## 2019-05-07 NOTE — Telephone Encounter (Signed)
Returned call to patient.  She agrees to pick up the prescription for Klonopin from [facility] in [place] today.  Writer called [facility] to change the date that was specified on her previous prescription today.  Since patient reports Zyprexa 5 mg is not helpful at all will increase Zyprexa to 10 mg.  I have sent prescription to her pharmacy in [place] today.  Patient advised to monitor herself for symptoms and not to stop the medication unless she has adverse side effects.  Discussed with patient that the medication has to build up in her system for good benefits.  She will monitor herself for muscle spasms and other side effects.

## 2019-05-12 ENCOUNTER — Telehealth: Payer: Self-pay | Admitting: Psychiatry

## 2019-05-12 NOTE — Telephone Encounter (Signed)
Spoke to Dr. Ola Spurr.  He saw her recently and she reported that she has a cocaine problem and wants to get help.  Patient is looking into inpatient substance abuse treatment programs.  Discussed [facility] as well as [facility] with Dr. Ola Spurr.  Will call patient to get this information.

## 2019-05-12 NOTE — Telephone Encounter (Signed)
Attempted to call Dr. Ola Spurr again.  Left voicemail to call writer back or give a time to talk.

## 2019-05-12 NOTE — Telephone Encounter (Signed)
Attempted to call Dr.Fitzgerald , left message . Received message from [place] that Dr.F needs a call back regarding patient.  Will try again.

## 2019-05-13 ENCOUNTER — Telehealth: Payer: Self-pay

## 2019-05-13 NOTE — Telephone Encounter (Signed)
left a message to call office back. i also tried again and left another message i will try again when i get back from lunch.

## 2019-05-13 NOTE — Telephone Encounter (Signed)
[facility] called back states that patient has a Financial controller and they dont accept medicare.

## 2019-05-13 NOTE — Telephone Encounter (Signed)
sent message that we would like to send a patient there for treatment

## 2019-05-13 NOTE — Telephone Encounter (Signed)
dr. Shea Evans spoke with patient pcp dr. Ouida Sills and they both would like for patient to go to a rehab center for drug use.

## 2019-05-13 NOTE — Telephone Encounter (Signed)
called and left another message dr. Shea Evans would like for patient to go to [facility] in [address] , [place] ,  [address] .  [phone]

## 2019-05-13 NOTE — Telephone Encounter (Signed)
(   i had called [facility] and they only take people who has no insurances person i spoke with recommend [facility] in [place].) i spoke with [facility] and they accept her insurance.i faxed over her information

## 2019-05-14 ENCOUNTER — Telehealth: Payer: Self-pay

## 2019-05-14 NOTE — Telephone Encounter (Signed)
Patient called to inform doctor that she went to [facility] in [place] and stated that they "didn't have any notes." She then stated that she went to [facility] but they would not do an intake for residency for her. She then stated that she went back home and stated she'll "have to work with 10mg ." She asked to have message relayed to the doctor. Thank you.

## 2019-05-14 NOTE — Telephone Encounter (Signed)
Notified patient and relayed message/LVM

## 2019-05-14 NOTE — Telephone Encounter (Signed)
Please ask her to go back to [facility] and while she is there she can ask them to request medical records from [place] and we can fax it over , please ask her to ask them for Medical detox from Benzodiazepines. They will admit her for that. Please tell her I will not be prescribing her anymore Benzos due to her cocaine abuse.

## 2019-05-17 ENCOUNTER — Telehealth: Payer: Self-pay

## 2019-05-17 NOTE — Telephone Encounter (Signed)
Medication management - Telephone call from pt with request to inform Dr. Shea Evans she had gone to [facility] but did not stay there.  Did not think was "a good fit" for her.  Reports sleeping better with Zyprexa and working on getting off of Klonopin and will discuss more with Dr. Shea Evans at her appointment on Thursday March 4th, this week as she plans to continue to work with Dr. Shea Evans to come off [place].

## 2019-05-17 NOTE — Telephone Encounter (Signed)
Thanks for the information

## 2019-05-18 ENCOUNTER — Telehealth: Payer: Self-pay | Admitting: Psychiatry

## 2019-05-18 DIAGNOSIS — F1320 Sedative, hypnotic or anxiolytic dependence, uncomplicated: Secondary | ICD-10-CM | POA: Insufficient documentation

## 2019-05-18 DIAGNOSIS — F1420 Cocaine dependence, uncomplicated: Secondary | ICD-10-CM

## 2019-05-18 DIAGNOSIS — F319 Bipolar disorder, unspecified: Secondary | ICD-10-CM

## 2019-05-18 DIAGNOSIS — F419 Anxiety disorder, unspecified: Secondary | ICD-10-CM

## 2019-05-18 MED ORDER — OXCARBAZEPINE 600 MG PO TABS: 600.0000 mg | ORAL_TABLET | Freq: Every day | ORAL | 1 refills | Status: DC

## 2019-05-18 NOTE — Telephone Encounter (Signed)
Returned call to patient.  She reports she did not feel [facility] was the right fit for her.  She wants other detox programs.  Provided her information for [facility]-provided her address to contact them.  Also advised her to call her health insurance plan to get a list if she does not think [facility] as a good fit for her.  Patient reports she has upcoming funeral that she needs to attend on March 4 and hence will not be able to keep the appointment with writer however will reschedule the appointment.  She however wants to know if writer will continue to provide her care.  Discussed with patient that writer will not be prescribing her benzodiazepines since she is abusing cocaine and needs to get help.  Writer encouraged her to get help for her substance abuse problems.  However advised patient to continue to follow-up with writer as well as her therapist for continued treatment .  We will send Trileptal which she needs refill to [facility] in [place].

## 2019-05-20 ENCOUNTER — Ambulatory Visit: Payer: Medicare Other | Admitting: Psychiatry

## 2019-06-02 ENCOUNTER — Telehealth: Payer: Self-pay | Admitting: Psychiatry

## 2019-06-02 DIAGNOSIS — Z9111 Patient's noncompliance with dietary regimen: Secondary | ICD-10-CM | POA: Insufficient documentation

## 2019-06-02 DIAGNOSIS — Z91199 Patient's noncompliance with other medical treatment and regimen due to unspecified reason: Secondary | ICD-10-CM | POA: Insufficient documentation

## 2019-06-02 DIAGNOSIS — Z9114 Patient's other noncompliance with medication regimen: Secondary | ICD-10-CM | POA: Insufficient documentation

## 2019-06-02 DIAGNOSIS — Z91148 Patient's other noncompliance with medication regimen for other reason: Secondary | ICD-10-CM | POA: Insufficient documentation

## 2019-06-02 NOTE — Telephone Encounter (Signed)
Returned call to patient per request from [facility] -staff .  Patient reports she has been noncompliant  with medications since she left all her medications with her daughter and now is back at work at [employer].  Encouraged patient to take her medications as prescribed except for her Klonopin.   Encouraged patient to call and make a sooner appointment as needed if she has worsening symptoms.  Patient has rescheduled multiple appointments and is also noncompliant with recommendations.

## 2019-06-03 ENCOUNTER — Ambulatory Visit: Payer: Medicare Other | Admitting: Psychiatry

## 2019-06-29 ENCOUNTER — Encounter: Payer: Self-pay | Admitting: Psychiatry

## 2019-06-29 ENCOUNTER — Other Ambulatory Visit: Payer: Self-pay

## 2019-06-29 ENCOUNTER — Ambulatory Visit (INDEPENDENT_AMBULATORY_CARE_PROVIDER_SITE_OTHER): Payer: Medicare Other | Admitting: Psychiatry

## 2019-06-29 DIAGNOSIS — F3177 Bipolar disorder, in partial remission, most recent episode mixed: Secondary | ICD-10-CM

## 2019-06-29 DIAGNOSIS — F411 Generalized anxiety disorder: Secondary | ICD-10-CM | POA: Diagnosis not present

## 2019-06-29 DIAGNOSIS — F1420 Cocaine dependence, uncomplicated: Secondary | ICD-10-CM | POA: Diagnosis not present

## 2019-06-29 MED ORDER — ESCITALOPRAM OXALATE 10 MG PO TABS: 10.0000 mg | ORAL_TABLET | Freq: Every day | ORAL | 1 refills | Status: DC

## 2019-06-29 MED ORDER — OXCARBAZEPINE 600 MG PO TABS: 600.0000 mg | ORAL_TABLET | Freq: Every day | ORAL | 1 refills | Status: DC

## 2019-06-29 MED ORDER — OLANZAPINE 10 MG PO TABS: 10.0000 mg | ORAL_TABLET | Freq: Every day | ORAL | 1 refills | Status: DC

## 2019-06-29 NOTE — Progress Notes (Signed)
Provider Location : [facility] Patient Location : Home  Virtual Visit via Video Note  I connected with Jennifer Welch on 06/29/19 at 11:30 AM EDT by a video enabled telemedicine application and verified that I am speaking with the correct person using two identifiers.   I discussed the limitations of evaluation and management by telemedicine and the availability of in person appointments. The patient expressed understanding and agreed to proceed.   I discussed the assessment and treatment plan with the patient. The patient was provided an opportunity to ask questions and all were answered. The patient agreed with the plan and demonstrated an understanding of the instructions.   The patient was advised to call back or seek an in-person evaluation if the symptoms worsen or if the condition fails to improve as anticipated.   [place] MD OP Progress Note  06/29/2019 12:04 PM [name]  MRN:  [id number]  Chief Complaint:  Chief Complaint    Follow-up     HPI: Jennifer Welch is a 59 year old African-American female who has a history of bipolar disorder, anxiety disorder, gastroesophageal reflux disease, IBS, recurrent lipomas, bilateral hand numbness, carpal tunnel was evaluated by telemedicine today.  Patient today reports ever since starting the Zyprexa she has been making progress.  She reports she was able to get off of cocaine as well as opioids and benzodiazepines completely.  She reports overall she was doing well with regards to her mood and sleep for a very long time.  However last week there was a day or 2 when she could not sleep and felt extremely agitated.  She however reports she felt much better during the weekend.  She reports she spent time with her family.  Her grandson stayed with her for a few days.  She reports that was like therapy for her.  She feels much better now.  She is compliant on medications as prescribed.  She denies side effects.  She reports sleep is improving.  She  continues to stay away from cocaine and reports her last use may have been 2 months ago.  Patient reports she wants to continue psychotherapy sessions with Mr. Maye Hides.  She is trying to reach Mr. Eulas Post to schedule an appointment.  Patient denies any suicidality, homicidality or perceptual disturbances.  Patient denies any other concerns today. Visit Diagnosis:    ICD-10-CM   1. Bipolar disorder, in partial remission, most recent episode mixed (HCC)  F31.77 oxcarbazepine (TRILEPTAL) 600 MG tablet    OLANZapine (ZYPREXA) 10 MG tablet    escitalopram (LEXAPRO) 10 MG tablet  2. GAD (generalized anxiety disorder)  F41.1   3. Cocaine use disorder, moderate, dependence ([place])  F14.20    IN EARLY REMISSION    Past Psychiatric History: I have reviewed past psychiatric history from my progress note on 04/06/2019.  Past trials of Seroquel-tolerated it well in the past, Trileptal, Prozac-side effects, Lexapro, Xanax, Klonopin, trazodone  Past Medical History:  Past Medical History:  Diagnosis Date  . Anxiety   . Bipolar 1 disorder ([place])   . Depression   . Hypertensive disorder 02/09/2019  . Neuromuscular disorder [facility])    CERVICAL DDD    Past Surgical History:  Procedure Laterality Date  . ABDOMINAL HYSTERECTOMY    . BONE EXCISION Right 12/15/2018   Procedure: PARTIAL EXCISION BONE-PHALANX RIGHT WITH FLUOROSCOPY;  Surgeon: Caroline More, DPM;  Location: [facility];  Service: Podiatry;  Laterality: Right;  MAC ANESTHESIA  . BREAST SURGERY    . ECTOPIC PREGNANCY SURGERY    .  FOOT SURGERY    . HAND TENDON SURGERY    . KNEE SURGERY    . OVARIAN CYST REMOVAL      Family Psychiatric History: I have reviewed family psychiatric history from my progress note on 04/06/2019  Family History:  Family History  Problem Relation Age of Onset  . Diabetes Mother   . Alcohol abuse Father   . Colon cancer Father   . Alcohol abuse Brother   . Hypertension Brother     Social History:  Reviewed social history from my progress note on 04/06/2019 Social History   Socioeconomic History  . Marital status: Married    Spouse name: darwin Hochstatter  . Number of children: 3  . Years of education: Not on file  . Highest education level: Some college, no degree  Occupational History  . Not on file  Tobacco Use  . Smoking status: Former Smoker    Types: Cigarettes  . Smokeless tobacco: Never Used  Substance and Sexual Activity  . Alcohol use: Not Currently  . Drug use: Never  . Sexual activity: Not Currently  Other Topics Concern  . Not on file  Social History Narrative  . Not on file   Social Determinants of Health   Financial Resource Strain:   . Difficulty of Paying Living Expenses:   Food Insecurity:   . Worried About Charity fundraiser in the Last Year:   . Arboriculturist in the Last Year:   Transportation Needs:   . Film/video editor (Medical):   Marland Kitchen Lack of Transportation (Non-Medical):   Physical Activity:   . Days of Exercise per Week:   . Minutes of Exercise per Session:   Stress:   . Feeling of Stress :   Social Connections:   . Frequency of Communication with Friends and Family:   . Frequency of Social Gatherings with Friends and Family:   . Attends Religious Services:   . Active Member of Clubs or Organizations:   . Attends Archivist Meetings:   Marland Kitchen Marital Status:     Allergies:  Allergies  Allergen Reactions  . Phentermine Other (See Comments)    Vivid dreams/DRUG INTERACTION NOT ALLERGIC Vivid dreams/DRUG INTERACTION NOT ALLERGIC   . Quinolones Nausea Only and Other (See Comments)  . Macrobid [Nitrofurantoin Macrocrystal] Rash  . Septra [Sulfamethoxazole-Trimethoprim] Rash    Metabolic Disorder Labs: No results found for: HGBA1C, MPG No results found for: PROLACTIN No results found for: CHOL, TRIG, HDL, CHOLHDL, VLDL, LDLCALC No results found for: TSH  Therapeutic Level Labs: No results found for: LITHIUM No results  found for: VALPROATE No components found for:  CBMZ  Current Medications: Current Outpatient Medications  Medication Sig Dispense Refill  . pregabalin (LYRICA) 100 MG capsule Take by mouth.    Marland Kitchen ascorbic acid (VITAMIN C) 1000 MG tablet Take by mouth.    . benzonatate (TESSALON PERLES) 100 MG capsule Take 1 capsule (100 mg total) by mouth every 6 (six) hours as needed for cough. 30 capsule 0  . Cholecalciferol 25 MCG (1000 UT) tablet Take by mouth.    . diclofenac Sodium (VOLTAREN) 1 % GEL Apply topically.    Marland Kitchen escitalopram (LEXAPRO) 10 MG tablet Take 1 tablet (10 mg total) by mouth daily. 30 tablet 1  . fexofenadine (ALLEGRA) 180 MG tablet Take by mouth.    . gabapentin (NEURONTIN) 100 MG capsule Take 100 mg by mouth 3 (three) times daily.    . hydrOXYzine (ATARAX/VISTARIL) 25  MG tablet Take 1 tablet (25 mg total) by mouth 3 (three) times daily as needed for anxiety. For severe anxiety symptoms 90 tablet 1  . ibuprofen (ADVIL) 200 MG tablet Take by mouth.    Marland Kitchen ketorolac (TORADOL) 10 MG tablet Take 1 tablet (10 mg total) by mouth every 8 (eight) hours as needed for severe pain. 20 tablet 0  . meloxicam (MOBIC) 15 MG tablet meloxicam 15 mg tablet    . naloxone (NARCAN) 4 MG/0.1ML LIQD nasal spray kit Narcan 4 mg/actuation nasal spray    . OLANZapine (ZYPREXA) 10 MG tablet Take 1 tablet (10 mg total) by mouth at bedtime. 30 tablet 1  . Omega-3 1000 MG CAPS Take by mouth.    . ondansetron (ZOFRAN) 4 MG tablet Take 1 tablet (4 mg total) by mouth every 8 (eight) hours as needed for nausea or vomiting. 20 tablet 0  . oxcarbazepine (TRILEPTAL) 600 MG tablet Take 1 tablet (600 mg total) by mouth daily. 30 tablet 1  . oxyCODONE-acetaminophen (PERCOCET) 7.5-325 MG tablet oxycodone-acetaminophen 7.5 mg-325 mg tablet  TAKE 1 TABLET BY MOUTH EVERY 6 HOURS AS NEEDED FOR UP TO 7 DAYS FOR SEVERE PAIN    . pantoprazole (PROTONIX) 40 MG tablet Take 40 mg by mouth daily.    . pregabalin (LYRICA) 100 MG capsule  Take 100 mg by mouth 3 (three) times daily.    Marland Kitchen tiZANidine (ZANAFLEX) 2 MG tablet tizanidine 2 mg tablet  TAKE 1 TABLET BY MOUTH THREE TIMES DAILY    . traMADol (ULTRAM) 50 MG tablet tramadol 50 mg tablet  TAKE 1 TABLET BY MOUTH EVERY 6 HOURS AS NEEDED FOR PAIN     No current facility-administered medications for this visit.     Musculoskeletal: Strength & Muscle Tone: UTA Gait & Station: normal Patient leans: N/A  Psychiatric Specialty Exam: Review of Systems  Musculoskeletal: Positive for back pain.  Psychiatric/Behavioral: Positive for sleep disturbance.  All other systems reviewed and are negative.   There were no vitals taken for this visit.There is no height or weight on file to calculate BMI.  General Appearance: Casual  Eye Contact:  Fair  Speech:  Clear and Coherent  Volume:  Normal  Mood:  Euthymic  Affect:  Congruent  Thought Process:  Goal Directed and Descriptions of Associations: Intact  Orientation:  Full (Time, Place, and Person)  Thought Content: Logical   Suicidal Thoughts:  No  Homicidal Thoughts:  No  Memory:  Immediate;   Fair Recent;   Fair Remote;   Fair  Judgement:  Fair  Insight:  Fair  Psychomotor Activity:  Normal  Concentration:  Concentration: Fair and Attention Span: Fair  Recall:  [employer] of Knowledge: Fair  Language: Fair  Akathisia:  No  Handed:  Right  AIMS (if indicated):UTA  Assets:  Communication Skills Desire for Improvement Housing Social Support  ADL's:  Intact  Cognition: WNL  Sleep:  Improving   Screenings:   Assessment and Plan: Jennifer Welch is a 59 year old African-American female, married, on disability, employed part-time, lives in [place] with her husband, has a history of bipolar disorder, generalized anxiety disorder, IBS, multiple surgeries, gastroesophageal reflux disease, carpal tunnel syndrome, chronic back pain, cocaine use disorder in remission, was evaluated by telemedicine today.  Patient is  biologically predisposed given her history of trauma, substance abuse problems.  She also has multiple medical problems, physical limitations.  Patient however is currently making progress and reports she is currently staying away from cocaine as  well as other addictive medications like opioids and benzodiazepines.  Discussed plan as noted below.  Plan Bipolar disorder mixed-partial remission Trileptal 600 mg p.o. daily Lexapro 10 mg p.o. daily Zyprexa 10 mg p.o. nightly  Generalized anxiety disorder-improving Patient advised to continue psychotherapy sessions with Mr. Maye Hides Discontinue Klonopin. Continue hydroxyzine 25 mg p.o. twice daily as needed for anxiety symptoms Continue Lexapro as prescribed  Cocaine use disorder in early remission-we will monitor closely.  Patient was referred for IOP program as well as was referred to multiple residential treatment programs.  Patient however declined. Currently reports she is sober.   I have communicated with [facility] to schedule patient with Mr. Maye Hides.  Patient also advised to call the clinic to schedule an appointment.  Pending records from previous psychiatrist.  We will coordinate care with primary care provider.  Follow-up in clinic in 4 weeks or sooner if needed.  I have spent atleast 20 minutes non face to face with patient today. More than 50 % of the time was spent for preparing to see the patient ( e.g., review of test, records ), ordering medications and test ,psychoeducation and supportive psychotherapy and care coordination,as well as documenting clinical information in electronic health record. This note was generated in part or whole with voice recognition software. Voice recognition is usually quite accurate but there are transcription errors that can and very often do occur. I apologize for any typographical errors that were not detected and corrected.       Ursula Alert, MD 06/29/2019, 12:04 PM

## 2019-07-13 ENCOUNTER — Telehealth: Payer: Self-pay

## 2019-07-13 NOTE — Telephone Encounter (Signed)
pt called left message that she is not sleep well

## 2019-07-13 NOTE — Telephone Encounter (Signed)
Returned call to patient.  She reports she had 1 night when she could not sleep at all.  She had some trazodone leftover and she tried taking that also which did not work.  However last night she was able to sleep.  Discussed with patient that since it happened only 1 night she could monitor herself.  If this happens again she could increase her Zyprexa to 15 mg and stay on the 50 mg until she comes back for her appointment next month.

## 2019-07-29 ENCOUNTER — Telehealth: Payer: Medicare Other | Admitting: Psychiatry

## 2019-07-29 ENCOUNTER — Telehealth (INDEPENDENT_AMBULATORY_CARE_PROVIDER_SITE_OTHER): Payer: Medicare Other | Admitting: Psychiatry

## 2019-07-29 ENCOUNTER — Encounter: Payer: Self-pay | Admitting: Psychiatry

## 2019-07-29 ENCOUNTER — Other Ambulatory Visit: Payer: Self-pay

## 2019-07-29 DIAGNOSIS — F3177 Bipolar disorder, in partial remission, most recent episode mixed: Secondary | ICD-10-CM | POA: Insufficient documentation

## 2019-07-29 DIAGNOSIS — F1420 Cocaine dependence, uncomplicated: Secondary | ICD-10-CM

## 2019-07-29 DIAGNOSIS — F3178 Bipolar disorder, in full remission, most recent episode mixed: Secondary | ICD-10-CM

## 2019-07-29 DIAGNOSIS — F411 Generalized anxiety disorder: Secondary | ICD-10-CM

## 2019-07-29 NOTE — Progress Notes (Signed)
Provider Location : [facility] Patient Location : Home  Virtual Visit via Video Note  I connected with Jennifer Welch on 07/29/19 at  1:00 PM EDT by a video enabled telemedicine application and verified that I am speaking with the correct person using two identifiers.   I discussed the limitations of evaluation and management by telemedicine and the availability of in person appointments. The patient expressed understanding and agreed to proceed.   I discussed the assessment and treatment plan with the patient. The patient was provided an opportunity to ask questions and all were answered. The patient agreed with the plan and demonstrated an understanding of the instructions.   The patient was advised to call back or seek an in-person evaluation if the symptoms worsen or if the condition fails to improve as anticipated.   [place] MD OP Progress Note  07/29/2019 6:26 PM Jennifer Welch  MRN:  [id number]  Chief Complaint:  Chief Complaint    Follow-up     HPI: Jennifer Welch is a 59 year old African-American female who is married, lives in [place] with her husband, who has a history of bipolar disorder, anxiety disorder, gastroesophageal reflux disease, IBS, recurrent lipomas, bilateral hand numbness, carpal tunnel syndrome was evaluated by telemedicine today.  Patient today reports she is currently doing well with regards to her mood.  She reports she is compliant on medications as prescribed.  She reports sleep is good.  Patient however reports she does have 1 or 2 nights once in a while when she cannot sleep at all.  The last time it happened she reports she gave her testimony at church and that night she could not sleep at all.  It has not happened since then.  Patient reports she continues to stay away from cocaine or cannabis.  She is currently sober.  Patient reports she continues to struggle with pain.  She would like to get it under control.  She will talk to her primary care provider  to refer her to a pain provider.  Patient denies any suicidality, homicidality or perceptual disturbances.  Patient reports she is awaiting a call from her therapist and is motivated to restart psychotherapy sessions.  Visit Diagnosis:    ICD-10-CM   1. Bipolar disorder, in full remission, most recent episode mixed ([place])  F31.78   2. GAD (generalized anxiety disorder)  F41.1   3. Cocaine use disorder, moderate, dependence (HCC)  F14.20    in early remission    Past Psychiatric History: I have reviewed past psychiatric history from my progress note on 04/06/2019.  Past trials of Seroquel-tolerated it well in the past, Trileptal, Prozac- side effects, Lexapro, Xanax, Klonopin, trazodone  Past Medical History:  Past Medical History:  Diagnosis Date  . Anxiety   . Bipolar 1 disorder ([place])   . Depression   . Hypertensive disorder 02/09/2019  . Neuromuscular disorder [facility])    CERVICAL DDD    Past Surgical History:  Procedure Laterality Date  . ABDOMINAL HYSTERECTOMY    . BONE EXCISION Right 12/15/2018   Procedure: PARTIAL EXCISION BONE-PHALANX RIGHT WITH FLUOROSCOPY;  Surgeon: Caroline More, DPM;  Location: [place];  Service: Podiatry;  Laterality: Right;  MAC ANESTHESIA  . BREAST SURGERY    . ECTOPIC PREGNANCY SURGERY    . FOOT SURGERY    . HAND TENDON SURGERY    . KNEE SURGERY    . OVARIAN CYST REMOVAL      Family Psychiatric History: I have reviewed family psychiatric history from my  progress note on 04/06/2019  Family History:  Family History  Problem Relation Age of Onset  . Diabetes Mother   . Alcohol abuse Father   . Colon cancer Father   . Alcohol abuse Brother   . Hypertension Brother     Social History: Reviewed social history from my progress note on 04/06/2019 Social History   Socioeconomic History  . Marital status: Married    Spouse name: darwin Sproull  . Number of children: 3  . Years of education: Not on file  . Highest education level: Some  college, no degree  Occupational History  . Not on file  Tobacco Use  . Smoking status: Former Smoker    Types: Cigarettes  . Smokeless tobacco: Never Used  Substance and Sexual Activity  . Alcohol use: Not Currently  . Drug use: Never  . Sexual activity: Not Currently  Other Topics Concern  . Not on file  Social History Narrative  . Not on file   Social Determinants of Health   Financial Resource Strain:   . Difficulty of Paying Living Expenses:   Food Insecurity:   . Worried About Charity fundraiser in the Last Year:   . Arboriculturist in the Last Year:   Transportation Needs:   . Film/video editor (Medical):   Marland Kitchen Lack of Transportation (Non-Medical):   Physical Activity:   . Days of Exercise per Week:   . Minutes of Exercise per Session:   Stress:   . Feeling of Stress :   Social Connections:   . Frequency of Communication with Friends and Family:   . Frequency of Social Gatherings with Friends and Family:   . Attends Religious Services:   . Active Member of Clubs or Organizations:   . Attends Archivist Meetings:   Marland Kitchen Marital Status:     Allergies:  Allergies  Allergen Reactions  . Phentermine Other (See Comments)    Vivid dreams/DRUG INTERACTION NOT ALLERGIC Vivid dreams/DRUG INTERACTION NOT ALLERGIC   . Quinolones Nausea Only and Other (See Comments)  . Macrobid [Nitrofurantoin Macrocrystal] Rash  . Septra [Sulfamethoxazole-Trimethoprim] Rash    Metabolic Disorder Labs: No results found for: HGBA1C, MPG No results found for: PROLACTIN No results found for: CHOL, TRIG, HDL, CHOLHDL, VLDL, LDLCALC No results found for: TSH  Therapeutic Level Labs: No results found for: LITHIUM No results found for: VALPROATE No components found for:  CBMZ  Current Medications: Current Outpatient Medications  Medication Sig Dispense Refill  . ascorbic acid (VITAMIN C) 1000 MG tablet Take by mouth.    . benzonatate (TESSALON PERLES) 100 MG capsule  Take 1 capsule (100 mg total) by mouth every 6 (six) hours as needed for cough. 30 capsule 0  . Cholecalciferol 25 MCG (1000 UT) tablet Take by mouth.    . diclofenac Sodium (VOLTAREN) 1 % GEL Apply topically.    Marland Kitchen doxycycline (VIBRAMYCIN) 100 MG capsule Take 100 mg by mouth 2 (two) times daily.    Marland Kitchen escitalopram (LEXAPRO) 10 MG tablet Take 1 tablet (10 mg total) by mouth daily. 30 tablet 1  . fexofenadine (ALLEGRA) 180 MG tablet Take by mouth.    . gabapentin (NEURONTIN) 100 MG capsule Take 100 mg by mouth 3 (three) times daily.    . hydrOXYzine (ATARAX/VISTARIL) 25 MG tablet Take 1 tablet (25 mg total) by mouth 3 (three) times daily as needed for anxiety. For severe anxiety symptoms 90 tablet 1  . ibuprofen (ADVIL) 200 MG tablet  Take by mouth.    Marland Kitchen ketorolac (TORADOL) 10 MG tablet Take 1 tablet (10 mg total) by mouth every 8 (eight) hours as needed for severe pain. 20 tablet 0  . meloxicam (MOBIC) 15 MG tablet meloxicam 15 mg tablet    . naloxone (NARCAN) 4 MG/0.1ML LIQD nasal spray kit Narcan 4 mg/actuation nasal spray    . OLANZapine (ZYPREXA) 10 MG tablet Take 1 tablet (10 mg total) by mouth at bedtime. 30 tablet 1  . Omega-3 1000 MG CAPS Take by mouth.    . ondansetron (ZOFRAN) 4 MG tablet Take 1 tablet (4 mg total) by mouth every 8 (eight) hours as needed for nausea or vomiting. 20 tablet 0  . oxcarbazepine (TRILEPTAL) 600 MG tablet Take 1 tablet (600 mg total) by mouth daily. 30 tablet 1  . oxyCODONE-acetaminophen (PERCOCET) 7.5-325 MG tablet oxycodone-acetaminophen 7.5 mg-325 mg tablet  TAKE 1 TABLET BY MOUTH EVERY 6 HOURS AS NEEDED FOR UP TO 7 DAYS FOR SEVERE PAIN    . pantoprazole (PROTONIX) 40 MG tablet Take 40 mg by mouth daily.    . pregabalin (LYRICA) 100 MG capsule Take by mouth.    . pregabalin (LYRICA) 100 MG capsule Take 100 mg by mouth 3 (three) times daily.    Marland Kitchen tiZANidine (ZANAFLEX) 2 MG tablet tizanidine 2 mg tablet  TAKE 1 TABLET BY MOUTH THREE TIMES DAILY    . traMADol  (ULTRAM) 50 MG tablet tramadol 50 mg tablet  TAKE 1 TABLET BY MOUTH EVERY 6 HOURS AS NEEDED FOR PAIN     No current facility-administered medications for this visit.     Musculoskeletal: Strength & Muscle Tone: UTA Gait & Station: normal Patient leans: N/A  Psychiatric Specialty Exam: Review of Systems  Musculoskeletal: Positive for back pain.  Psychiatric/Behavioral: Negative for agitation, behavioral problems, confusion, decreased concentration, dysphoric mood, hallucinations, self-injury, sleep disturbance and suicidal ideas. The patient is not nervous/anxious and is not hyperactive.   All other systems reviewed and are negative.   There were no vitals taken for this visit.There is no height or weight on file to calculate BMI.  General Appearance: Casual  Eye Contact:  Good  Speech:  Normal Rate  Volume:  Normal  Mood:  Euthymic  Affect:  Congruent  Thought Process:  Goal Directed and Descriptions of Associations: Intact  Orientation:  Full (Time, Place, and Person)  Thought Content: Logical   Suicidal Thoughts:  No  Homicidal Thoughts:  No  Memory:  Immediate;   Fair Recent;   Fair Remote;   Fair  Judgement:  Fair  Insight:  Fair  Psychomotor Activity:  Normal  Concentration:  Concentration: Fair and Attention Span: Fair  Recall:  [employer] of Knowledge: Fair  Language: Fair  Akathisia:  No  Handed:  Right  AIMS (if indicated): UTA  Assets:  Communication Skills Desire for Improvement Housing Social Support  ADL's:  Intact  Cognition: WNL  Sleep:  Fair   Screenings:   Assessment and Plan: Jaryiah Nierenberg is a 59 year old African-American female, married, on disability, employed part-time, lives in [place] with her husband, has a history of bipolar disorder, generalized anxiety disorder, IBS, multiple surgeries, gastroesophageal reflux disease, carpal tunnel syndrome, chronic back pain, cocaine use disorder in remission was evaluated by telemedicine today.   She is biologically predisposed given her history of trauma, substance abuse problems.  She also has multiple medical problems, physical limitations.  Patient is currently doing well on the current medication regimen .  However she will benefit from psychotherapy sessions.  Plan as noted below.  Plan Bipolar disorder in remission Trileptal 600 mg p.o. daily Lexapro 10 mg p.o. daily Zyprexa 10 mg p.o. nightly  GAD-improving Patient advised to continue psychotherapy sessions with Mr. Eulas Post I have reached out to Mr. Eulas Post who has agreed to schedule the patient for a therapy session. Continue hydroxyzine 25 mg p.o. twice daily as needed for anxiety symptoms Continue Lexapro 10 mg p.o. daily  Cocaine use disorder in early remission-she continues to stay sober   Pending records from previous psychiatrist.  Follow-up in clinic in 5 weeks or sooner if needed.  I have spent atleast 20 minutes non face to face with patient today. More than 50 % of the time was spent for preparing to see the patient ( e.g., review of test, records ), ordering medications and test ,psychoeducation and supportive psychotherapy and care coordination,as well as documenting clinical information in electronic health record. This note was generated in part or whole with voice recognition software. Voice recognition is usually quite accurate but there are transcription errors that can and very often do occur. I apologize for any typographical errors that were not detected and corrected.       Ursula Alert, MD 07/29/2019, 6:26 PM

## 2019-08-15 ENCOUNTER — Other Ambulatory Visit: Payer: Self-pay | Admitting: Psychiatry

## 2019-08-15 DIAGNOSIS — F319 Bipolar disorder, unspecified: Secondary | ICD-10-CM

## 2019-09-02 ENCOUNTER — Telehealth: Payer: Medicare Other | Admitting: Psychiatry

## 2019-09-06 ENCOUNTER — Other Ambulatory Visit: Payer: Self-pay

## 2019-09-06 ENCOUNTER — Telehealth: Payer: Self-pay

## 2019-09-06 ENCOUNTER — Encounter: Payer: Self-pay | Admitting: Psychiatry

## 2019-09-06 ENCOUNTER — Telehealth (INDEPENDENT_AMBULATORY_CARE_PROVIDER_SITE_OTHER): Payer: Medicare Other | Admitting: Psychiatry

## 2019-09-06 DIAGNOSIS — Z79899 Other long term (current) drug therapy: Secondary | ICD-10-CM | POA: Diagnosis not present

## 2019-09-06 DIAGNOSIS — F1421 Cocaine dependence, in remission: Secondary | ICD-10-CM

## 2019-09-06 DIAGNOSIS — F3178 Bipolar disorder, in full remission, most recent episode mixed: Secondary | ICD-10-CM

## 2019-09-06 DIAGNOSIS — F411 Generalized anxiety disorder: Secondary | ICD-10-CM | POA: Diagnosis not present

## 2019-09-06 MED ORDER — OXCARBAZEPINE 600 MG PO TABS: 600.0000 mg | ORAL_TABLET | Freq: Every day | ORAL | 1 refills | Status: DC

## 2019-09-06 MED ORDER — ESCITALOPRAM OXALATE 10 MG PO TABS: 10.0000 mg | ORAL_TABLET | Freq: Every day | ORAL | 1 refills | Status: DC

## 2019-09-06 MED ORDER — OLANZAPINE 10 MG PO TABS: 10.0000 mg | ORAL_TABLET | Freq: Every day | ORAL | 1 refills | Status: DC

## 2019-09-06 NOTE — Telephone Encounter (Signed)
faxed and confirmed labwork orders  

## 2019-09-06 NOTE — Progress Notes (Signed)
Provider Location : [facility] Patient Location : Home  Virtual Visit via Video Note  I connected with Jennifer Welch on 09/06/19 at  3:00 PM EDT by a video enabled telemedicine application and verified that I am speaking with the correct person using two identifiers.   I discussed the limitations of evaluation and management by telemedicine and the availability of in person appointments. The patient expressed understanding and agreed to proceed.    I discussed the assessment and treatment plan with the patient. The patient was provided an opportunity to ask questions and all were answered. The patient agreed with the plan and demonstrated an understanding of the instructions.   The patient was advised to call back or seek an in-person evaluation if the symptoms worsen or if the condition fails to improve as anticipated.   [place] MD OP Progress Note  09/06/2019 3:29 PM Amalya Salmons  MRN:  [id number]  Chief Complaint:  Chief Complaint    Follow-up     HPI: Jennifer Welch is a 59 year old, African-American female who is married, lives in [place] with her husband, has a history of bipolar disorder, anxiety disorder, gastroesophageal reflux disease, IBS, recurrent lipomas, carpal tunnel syndrome was evaluated by telemedicine today.  A video call was initiated however due to connection problem it had to be changed to a phone call.  Patient today reports she is currently doing well with regards to her mood.  She denies any significant mood swings, depression or anxiety.  She reports sleep continues to be good.  Patient denies any suicidality, homicidality or perceptual disturbances.  Patient reports she continues to stay away from cocaine.  She however reports she has been struggling with fatigue and low energy since the past 2 weeks or so.  She reports she is in a lot of pain from her back due to bulging disc which could be triggering this.  She also reports she ran out of her muscle relaxant  which could also be making things worse for her.  Patient denies any other concerns today.  Visit Diagnosis:    ICD-10-CM   1. Bipolar disorder, in full remission, most recent episode mixed (HCC)  F31.78 escitalopram (LEXAPRO) 10 MG tablet    OLANZapine (ZYPREXA) 10 MG tablet    oxcarbazepine (TRILEPTAL) 600 MG tablet  2. GAD (generalized anxiety disorder)  F41.1   3. Cocaine use disorder, moderate, in early remission ([place])  F14.21   4. High risk medication use  Z79.899 Lipid panel    Hemoglobin A1C    TSH    Prolactin    CBC With Differential    Comprehensive metabolic panel    Past Psychiatric History: I have reviewed past psychiatric history from my progress note on 04/06/2019.  Past trials of Seroquel-tolerated it well in the past, Trileptal, Prozac-side effects, Lexapro, Xanax, Klonopin, trazodone.  Past Medical History:  Past Medical History:  Diagnosis Date  . Anxiety   . Bipolar 1 disorder ([place])   . Depression   . Hypertensive disorder 02/09/2019  . Neuromuscular disorder [facility])    CERVICAL DDD    Past Surgical History:  Procedure Laterality Date  . ABDOMINAL HYSTERECTOMY    . BONE EXCISION Right 12/15/2018   Procedure: PARTIAL EXCISION BONE-PHALANX RIGHT WITH FLUOROSCOPY;  Surgeon: Caroline More, DPM;  Location: [facility];  Service: Podiatry;  Laterality: Right;  MAC ANESTHESIA  . BREAST SURGERY    . ECTOPIC PREGNANCY SURGERY    . FOOT SURGERY    . HAND TENDON SURGERY    .  KNEE SURGERY    . OVARIAN CYST REMOVAL      Family Psychiatric History: I have reviewed family psychiatric history from my progress note on 04/06/2019  Family History:  Family History  Problem Relation Age of Onset  . Diabetes Mother   . Alcohol abuse Father   . Colon cancer Father   . Alcohol abuse Brother   . Hypertension Brother     Social History: Reviewed social history from my progress note on 04/06/2019 Social History   Socioeconomic History  . Marital status: Married     Spouse name: darwin Mordan  . Number of children: 3  . Years of education: Not on file  . Highest education level: Some college, no degree  Occupational History  . Not on file  Tobacco Use  . Smoking status: Former Smoker    Types: Cigarettes  . Smokeless tobacco: Never Used  Vaping Use  . Vaping Use: Never used  Substance and Sexual Activity  . Alcohol use: Not Currently  . Drug use: Never  . Sexual activity: Not Currently  Other Topics Concern  . Not on file  Social History Narrative  . Not on file   Social Determinants of Health   Financial Resource Strain:   . Difficulty of Paying Living Expenses:   Food Insecurity:   . Worried About Charity fundraiser in the Last Year:   . Arboriculturist in the Last Year:   Transportation Needs:   . Film/video editor (Medical):   Marland Kitchen Lack of Transportation (Non-Medical):   Physical Activity:   . Days of Exercise per Week:   . Minutes of Exercise per Session:   Stress:   . Feeling of Stress :   Social Connections:   . Frequency of Communication with Friends and Family:   . Frequency of Social Gatherings with Friends and Family:   . Attends Religious Services:   . Active Member of Clubs or Organizations:   . Attends Archivist Meetings:   Marland Kitchen Marital Status:     Allergies:  Allergies  Allergen Reactions  . Phentermine Other (See Comments)    Vivid dreams/DRUG INTERACTION NOT ALLERGIC Vivid dreams/DRUG INTERACTION NOT ALLERGIC   . Quinolones Nausea Only and Other (See Comments)  . Macrobid [Nitrofurantoin Macrocrystal] Rash  . Septra [Sulfamethoxazole-Trimethoprim] Rash    Metabolic Disorder Labs: No results found for: HGBA1C, MPG No results found for: PROLACTIN No results found for: CHOL, TRIG, HDL, CHOLHDL, VLDL, LDLCALC No results found for: TSH  Therapeutic Level Labs: No results found for: LITHIUM No results found for: VALPROATE No components found for:  CBMZ  Current Medications: Current  Outpatient Medications  Medication Sig Dispense Refill  . ascorbic acid (VITAMIN C) 1000 MG tablet Take by mouth.    . benzonatate (TESSALON PERLES) 100 MG capsule Take 1 capsule (100 mg total) by mouth every 6 (six) hours as needed for cough. 30 capsule 0  . Cholecalciferol 25 MCG (1000 UT) tablet Take by mouth.    . diclofenac Sodium (VOLTAREN) 1 % GEL Apply topically.    Marland Kitchen doxycycline (VIBRAMYCIN) 100 MG capsule Take 100 mg by mouth 2 (two) times daily.    Marland Kitchen escitalopram (LEXAPRO) 10 MG tablet Take 1 tablet (10 mg total) by mouth daily. 30 tablet 1  . fexofenadine (ALLEGRA) 180 MG tablet Take by mouth.    . gabapentin (NEURONTIN) 100 MG capsule Take 100 mg by mouth 3 (three) times daily.    . hydrOXYzine (  ATARAX/VISTARIL) 25 MG tablet TAKE 1 TABLET BY MOUTH THREE TIMES DAILY AS NEEDED FOR SEVERE ANXIETY SYMPTOMS 90 tablet 0  . ibuprofen (ADVIL) 200 MG tablet Take by mouth.    Marland Kitchen ketorolac (TORADOL) 10 MG tablet Take 1 tablet (10 mg total) by mouth every 8 (eight) hours as needed for severe pain. 20 tablet 0  . meloxicam (MOBIC) 15 MG tablet meloxicam 15 mg tablet    . naloxone (NARCAN) 4 MG/0.1ML LIQD nasal spray kit Narcan 4 mg/actuation nasal spray    . OLANZapine (ZYPREXA) 10 MG tablet Take 1 tablet (10 mg total) by mouth at bedtime. 30 tablet 1  . Omega-3 1000 MG CAPS Take by mouth.    . ondansetron (ZOFRAN) 4 MG tablet Take 1 tablet (4 mg total) by mouth every 8 (eight) hours as needed for nausea or vomiting. 20 tablet 0  . oxcarbazepine (TRILEPTAL) 600 MG tablet Take 1 tablet (600 mg total) by mouth daily. 30 tablet 1  . oxyCODONE-acetaminophen (PERCOCET) 7.5-325 MG tablet oxycodone-acetaminophen 7.5 mg-325 mg tablet  TAKE 1 TABLET BY MOUTH EVERY 6 HOURS AS NEEDED FOR UP TO 7 DAYS FOR SEVERE PAIN    . pantoprazole (PROTONIX) 40 MG tablet Take 40 mg by mouth daily.    . pregabalin (LYRICA) 100 MG capsule Take by mouth.    . pregabalin (LYRICA) 100 MG capsule Take 100 mg by mouth 3  (three) times daily.    Marland Kitchen tiZANidine (ZANAFLEX) 2 MG tablet tizanidine 2 mg tablet  TAKE 1 TABLET BY MOUTH THREE TIMES DAILY    . traMADol (ULTRAM) 50 MG tablet tramadol 50 mg tablet  TAKE 1 TABLET BY MOUTH EVERY 6 HOURS AS NEEDED FOR PAIN     No current facility-administered medications for this visit.     Musculoskeletal: Strength & Muscle Tone: UTA Gait & Station: UTA Patient leans: N/A  Psychiatric Specialty Exam: Review of Systems  Constitutional: Positive for fatigue.  Musculoskeletal: Positive for back pain.  Psychiatric/Behavioral: Negative for agitation, behavioral problems, confusion, decreased concentration, dysphoric mood, hallucinations, self-injury, sleep disturbance and suicidal ideas. The patient is not nervous/anxious and is not hyperactive.   All other systems reviewed and are negative.   There were no vitals taken for this visit.There is no height or weight on file to calculate BMI.  General Appearance: UTA  Eye Contact:  UTA  Speech:  Clear and Coherent  Volume:  Normal  Mood:  Euthymic  Affect:  UTA  Thought Process:  Goal Directed and Descriptions of Associations: Intact  Orientation:  Full (Time, Place, and Person)  Thought Content: Logical   Suicidal Thoughts:  No  Homicidal Thoughts:  No  Memory:  Immediate;   Fair Recent;   Fair Remote;   Fair  Judgement:  Fair  Insight:  Fair  Psychomotor Activity:  UTA  Concentration:  Concentration: Fair and Attention Span: Fair  Recall:  [employer] of Knowledge: Fair  Language: Fair  Akathisia:  No  Handed:  Right  AIMS (if indicated): UTA  Assets:  Communication Skills Desire for Improvement Housing Social Support  ADL's:  Intact  Cognition: WNL  Sleep:  Fair   Screenings:   Assessment and Plan: Anabelen Trebilcock is a 59 year old African-American female, married, on disability, employed part-time, lives in [place] with her husband, has a history of bipolar disorder, generalized anxiety disorder,  IBS, multiple surgeries, gastroesophageal reflux disease, carpal tunnel syndrome, chronic back pain, cocaine use disorder in remission was evaluated by telemedicine today.  Patient is biologically predisposed given her history of trauma, substance abuse problems.  Patient with multiple medical problems, physical limitations.  Patient is currently stable on medications.  Plan as noted below.  Plan Bipolar disorder in remission Trileptal 600 mg p.o. daily Lexapro 10 mg p.o. daily Zyprexa 10 mg p.o. nightly  GAD-stable Patient to continue psychotherapy sessions and was referred to Mr. Eulas Post. Continue hydroxyzine 25 mg p.o. twice daily as needed for severe anxiety symptoms Lexapro 10 mg p.o. daily  Cocaine use disorder in early remission-we will monitor closely  High risk medication use-will order TSH, lipid panel, hemoglobin A1c, prolactin, CBC with differential and CMP.  Patient will go to hospital lab [facility] at [facility].  Follow-up in clinic in 6 to 8 weeks or sooner if needed.  I have spent atleast 20 minutes non face to face with patient today. More than 50 % of the time was spent for preparing to see the patient ( e.g., review of test, records ) , ordering medications and test ,psychoeducation and supportive psychotherapy and care coordination,as well as documenting clinical information in electronic health record. This note was generated in part or whole with voice recognition software. Voice recognition is usually quite accurate but there are transcription errors that can and very often do occur. I apologize for any typographical errors that were not detected and corrected.        Ursula Alert, MD 09/06/2019, 3:29 PM

## 2019-09-08 ENCOUNTER — Other Ambulatory Visit
Admission: RE | Admit: 2019-09-08 | Discharge: 2019-09-08 | Disposition: A | Discharge status: Home / Self Care | Payer: Medicare Other | Source: Ambulatory Visit | Attending: Psychiatry | Admitting: Psychiatry

## 2019-09-08 DIAGNOSIS — Z79899 Other long term (current) drug therapy: Secondary | ICD-10-CM | POA: Diagnosis present

## 2019-09-08 LAB — CBC WITH DIFFERENTIAL/PLATELET
Abs Immature Granulocytes: 0.01 10*3/uL (ref 0.00–0.07)
Basophils Absolute: 0 10*3/uL (ref 0.0–0.1)
Basophils Relative: 1 %
Eosinophils Absolute: 0 10*3/uL (ref 0.0–0.5)
Eosinophils Relative: 1 %
HCT: 41 % (ref 36.0–46.0)
Hemoglobin: 13.2 g/dL (ref 12.0–15.0)
Immature Granulocytes: 0 %
Lymphocytes Relative: 36 %
Lymphs Abs: 1.7 10*3/uL (ref 0.7–4.0)
MCH: 28.4 pg (ref 26.0–34.0)
MCHC: 32.2 g/dL (ref 30.0–36.0)
MCV: 88.2 fL (ref 80.0–100.0)
Monocytes Absolute: 0.4 10*3/uL (ref 0.1–1.0)
Monocytes Relative: 8 %
Neutro Abs: 2.5 10*3/uL (ref 1.7–7.7)
Neutrophils Relative %: 54 %
Platelets: 291 10*3/uL (ref 150–400)
RBC: 4.65 MIL/uL (ref 3.87–5.11)
RDW: 13.5 % (ref 11.5–15.5)
WBC: 4.6 10*3/uL (ref 4.0–10.5)
nRBC: 0 % (ref 0.0–0.2)

## 2019-09-08 LAB — COMPREHENSIVE METABOLIC PANEL
ALT: 15 U/L (ref 0–44)
AST: 16 U/L (ref 15–41)
Albumin: 3.9 g/dL (ref 3.5–5.0)
Alkaline Phosphatase: 60 U/L (ref 38–126)
Anion gap: 8 (ref 5–15)
BUN: 13 mg/dL (ref 6–20)
CO2: 26 mmol/L (ref 22–32)
Calcium: 9.1 mg/dL (ref 8.9–10.3)
Chloride: 108 mmol/L (ref 98–111)
Creatinine, Ser: 0.99 mg/dL (ref 0.44–1.00)
GFR calc Af Amer: >60 mL/min (ref >60)
GFR calc non Af Amer: >60 mL/min (ref >60)
Glucose, Bld: 100 mg/dL — ABNORMAL HIGH (ref 70–99)
Potassium: 4 mmol/L (ref 3.5–5.1)
Sodium: 142 mmol/L (ref 135–145)
Total Bilirubin: 0.6 mg/dL (ref 0.3–1.2)
Total Protein: 7.2 g/dL (ref 6.5–8.1)

## 2019-09-08 LAB — LIPID PANEL
Cholesterol: 198 mg/dL (ref 0–200)
HDL: 88 mg/dL (ref >40)
LDL Cholesterol: 103 mg/dL — ABNORMAL HIGH (ref 0–99)
Total CHOL/HDL Ratio: 2.3 RATIO
Triglycerides: 33 mg/dL (ref <150)
VLDL: 7 mg/dL (ref 0–40)

## 2019-09-08 LAB — PROCALCITONIN: Procalcitonin: <0.1 ng/mL

## 2019-09-08 LAB — TSH: TSH: 0.704 u[IU]/mL (ref 0.350–4.500)

## 2019-09-10 LAB — HEMOGLOBIN A1C
Hgb A1c MFr Bld: 5.6 % (ref 4.8–5.6)
Mean Plasma Glucose: 114 mg/dL

## 2019-09-27 ENCOUNTER — Other Ambulatory Visit: Payer: Self-pay | Admitting: Psychiatry

## 2019-09-27 DIAGNOSIS — F319 Bipolar disorder, unspecified: Secondary | ICD-10-CM

## 2019-10-07 ENCOUNTER — Telehealth: Payer: Self-pay

## 2019-10-07 NOTE — Telephone Encounter (Signed)
Please call her and let her know that her labs from 09/08/2019 were stable and Dr. Shea Evans can re-review with her again during her next appointment.

## 2019-10-07 NOTE — Telephone Encounter (Signed)
pt called left a message that she like to check on her lab work results.

## 2019-11-08 ENCOUNTER — Other Ambulatory Visit: Payer: Self-pay

## 2019-11-08 ENCOUNTER — Ambulatory Visit (INDEPENDENT_AMBULATORY_CARE_PROVIDER_SITE_OTHER): Payer: Medicare Other | Admitting: Psychiatry

## 2019-11-08 DIAGNOSIS — Z5329 Procedure and treatment not carried out because of patient's decision for other reasons: Secondary | ICD-10-CM

## 2019-11-08 NOTE — Progress Notes (Signed)
No response to call or text or video invite  

## 2019-11-18 ENCOUNTER — Encounter: Payer: Self-pay | Admitting: Psychiatry

## 2019-11-18 ENCOUNTER — Telehealth (INDEPENDENT_AMBULATORY_CARE_PROVIDER_SITE_OTHER): Payer: Medicare Other | Admitting: Psychiatry

## 2019-11-18 ENCOUNTER — Other Ambulatory Visit: Payer: Self-pay

## 2019-11-18 DIAGNOSIS — F3178 Bipolar disorder, in full remission, most recent episode mixed: Secondary | ICD-10-CM

## 2019-11-18 DIAGNOSIS — Z79899 Other long term (current) drug therapy: Secondary | ICD-10-CM | POA: Diagnosis not present

## 2019-11-18 DIAGNOSIS — F1420 Cocaine dependence, uncomplicated: Secondary | ICD-10-CM | POA: Diagnosis not present

## 2019-11-18 DIAGNOSIS — F411 Generalized anxiety disorder: Secondary | ICD-10-CM | POA: Diagnosis not present

## 2019-11-18 MED ORDER — ESCITALOPRAM OXALATE 10 MG PO TABS: 10.0000 mg | ORAL_TABLET | Freq: Every day | ORAL | 1 refills | Status: DC

## 2019-11-18 MED ORDER — HYDROXYZINE HCL 25 MG PO TABS: ORAL_TABLET | ORAL | 0 refills | Status: DC

## 2019-11-18 MED ORDER — OLANZAPINE 10 MG PO TABS: 10.0000 mg | ORAL_TABLET | Freq: Every day | ORAL | 1 refills | Status: DC

## 2019-11-18 MED ORDER — OXCARBAZEPINE 600 MG PO TABS: 600.0000 mg | ORAL_TABLET | Freq: Every day | ORAL | 1 refills | Status: DC

## 2019-11-18 NOTE — Progress Notes (Signed)
Provider Location : [facility] Patient Location : Home  Participants: Patient , Provider  Virtual Visit via Telephone Note  I connected with Jennifer Welch on 11/18/19 at  2:20 PM EDT by telephone and verified that I am speaking with the correct person using two identifiers.   I discussed the limitations, risks, security and privacy concerns of performing an evaluation and management service by telephone and the availability of in person appointments. I also discussed with the patient that there may be a patient responsible charge related to this service. The patient expressed understanding and agreed to proceed.      I discussed the assessment and treatment plan with the patient. The patient was provided an opportunity to ask questions and all were answered. The patient agreed with the plan and demonstrated an understanding of the instructions.   The patient was advised to call back or seek an in-person evaluation if the symptoms worsen or if the condition fails to improve as anticipated.   [facility] MD OP Progress Note  11/18/2019 3:14 PM Jennifer Welch  MRN:  [id number]  Chief Complaint:  Chief Complaint    Follow-up     HPI: Jennifer Welch is a 59 year old African-American female, married, lives in [place], has a history of bipolar disorder, anxiety disorder, gastroesophageal reflux disease, IBS, recurrent lipomas, carpal tunnel syndrome was evaluated by telemedicine today.  Patient today reports she is currently struggling with a swollen lymph node in her groin.  She reports she is following up with her provider for the same and was prescribed antibiotics which has not helped much.  She continues to have follow-up appointment scheduled for the same.  She otherwise reports she is doing well.  She denies any significant mood lability or irritability.  Patient reports sleep is good on the olanzapine.  She does worry about her weight gain however she wants to stay on the olanzapine at this  time.  She is agreeable to watching her diet.  Patient denies any suicidality, homicidality or perceptual disturbances.  Discussed and reviewed her most recent labs.  Patient denies any other concerns today.  Visit Diagnosis:    ICD-10-CM   1. Bipolar disorder, in full remission, most recent episode mixed (HCC)  F31.78 escitalopram (LEXAPRO) 10 MG tablet    OLANZapine (ZYPREXA) 10 MG tablet    oxcarbazepine (TRILEPTAL) 600 MG tablet  2. GAD (generalized anxiety disorder)  F41.1 hydrOXYzine (ATARAX/VISTARIL) 25 MG tablet  3. Cocaine use disorder, moderate, dependence (HCC)  F14.20   4. High risk medication use  Z79.899     Past Psychiatric History: I have reviewed past psychiatric history from my progress note on 04/06/2019.  Past trials of Seroquel-tolerated it well in the past.  Trileptal, Prozac-side effects, Lexapro, Xanax, Klonopin, trazodone.  Past Medical History:  Past Medical History:  Diagnosis Date  . Anxiety   . Bipolar 1 disorder ([place])   . Depression   . Hypertensive disorder 02/09/2019  . Neuromuscular disorder [facility])    CERVICAL DDD    Past Surgical History:  Procedure Laterality Date  . ABDOMINAL HYSTERECTOMY    . BONE EXCISION Right 12/15/2018   Procedure: PARTIAL EXCISION BONE-PHALANX RIGHT WITH FLUOROSCOPY;  Surgeon: Caroline More, DPM;  Location: [facility];  Service: Podiatry;  Laterality: Right;  MAC ANESTHESIA  . BREAST SURGERY    . ECTOPIC PREGNANCY SURGERY    . FOOT SURGERY    . HAND TENDON SURGERY    . KNEE SURGERY    . OVARIAN CYST REMOVAL  Family Psychiatric History: I have reviewed family psychiatric history from my progress note on 04/06/2019  Family History:  Family History  Problem Relation Age of Onset  . Diabetes Mother   . Alcohol abuse Father   . Colon cancer Father   . Alcohol abuse Brother   . Hypertension Brother     Social History: Reviewed social history from my progress note on 04/06/2019 Social History    Socioeconomic History  . Marital status: Married    Spouse name: darwin Hewson  . Number of children: 3  . Years of education: Not on file  . Highest education level: Some college, no degree  Occupational History  . Not on file  Tobacco Use  . Smoking status: Former Smoker    Types: Cigarettes  . Smokeless tobacco: Never Used  Vaping Use  . Vaping Use: Never used  Substance and Sexual Activity  . Alcohol use: Not Currently  . Drug use: Never  . Sexual activity: Not Currently  Other Topics Concern  . Not on file  Social History Narrative  . Not on file   Social Determinants of Health   Financial Resource Strain:   . Difficulty of Paying Living Expenses: Not on file  Food Insecurity:   . Worried About Charity fundraiser in the Last Year: Not on file  . Ran Out of Food in the Last Year: Not on file  Transportation Needs:   . Lack of Transportation (Medical): Not on file  . Lack of Transportation (Non-Medical): Not on file  Physical Activity:   . Days of Exercise per Week: Not on file  . Minutes of Exercise per Session: Not on file  Stress:   . Feeling of Stress : Not on file  Social Connections:   . Frequency of Communication with Friends and Family: Not on file  . Frequency of Social Gatherings with Friends and Family: Not on file  . Attends Religious Services: Not on file  . Active Member of Clubs or Organizations: Not on file  . Attends Archivist Meetings: Not on file  . Marital Status: Not on file    Allergies:  Allergies  Allergen Reactions  . Phentermine Other (See Comments)    Vivid dreams/DRUG INTERACTION NOT ALLERGIC Vivid dreams/DRUG INTERACTION NOT ALLERGIC   . Quinolones Nausea Only and Other (See Comments)  . Macrobid [Nitrofurantoin Macrocrystal] Rash  . Septra [Sulfamethoxazole-Trimethoprim] Rash    Metabolic Disorder Labs: Lab Results  Component Value Date   HGBA1C 5.6 09/08/2019   MPG 114 09/08/2019   No results found for:  PROLACTIN Lab Results  Component Value Date   CHOL 198 09/08/2019   TRIG 33 09/08/2019   HDL 88 09/08/2019   CHOLHDL 2.3 09/08/2019   VLDL 7 09/08/2019   LDLCALC 103 (H) 09/08/2019   Lab Results  Component Value Date   TSH 0.704 09/08/2019    Therapeutic Level Labs: No results found for: LITHIUM No results found for: VALPROATE No components found for:  CBMZ  Current Medications: Current Outpatient Medications  Medication Sig Dispense Refill  . ascorbic acid (VITAMIN C) 1000 MG tablet Take by mouth.    . Cholecalciferol 25 MCG (1000 UT) tablet Take 400 Units by mouth.     . diclofenac Sodium (VOLTAREN) 1 % GEL Apply topically.    Marland Kitchen erythromycin ophthalmic ointment SMARTSIG:In Eye(s)    . escitalopram (LEXAPRO) 10 MG tablet Take 1 tablet (10 mg total) by mouth daily. 30 tablet 1  . fexofenadine (  ALLEGRA) 180 MG tablet Take by mouth.    . gabapentin (NEURONTIN) 100 MG capsule Take 100 mg by mouth 3 (three) times daily.    . hydrOXYzine (ATARAX/VISTARIL) 25 MG tablet TAKE 1 TABLET BY MOUTH THREE TIMES DAILY AS NEEDED FOR SEVERE ANXIETY SYMPTOMS 90 tablet 0  . OLANZapine (ZYPREXA) 10 MG tablet Take 1 tablet (10 mg total) by mouth at bedtime. 30 tablet 1  . Omega-3 1000 MG CAPS Take by mouth.    . ondansetron (ZOFRAN) 4 MG tablet Take 1 tablet (4 mg total) by mouth every 8 (eight) hours as needed for nausea or vomiting. 20 tablet 0  . oxcarbazepine (TRILEPTAL) 600 MG tablet Take 1 tablet (600 mg total) by mouth daily. 30 tablet 1  . pantoprazole (PROTONIX) 40 MG tablet Take 40 mg by mouth daily.    . pregabalin (LYRICA) 100 MG capsule Take 100 mg by mouth 3 (three) times daily.    Marland Kitchen tiZANidine (ZANAFLEX) 2 MG tablet tizanidine 2 mg tablet  TAKE 1 TABLET BY MOUTH THREE TIMES DAILY    . benzonatate (TESSALON PERLES) 100 MG capsule Take 1 capsule (100 mg total) by mouth every 6 (six) hours as needed for cough. (Patient not taking: Reported on 11/18/2019) 30 capsule 0  . doxycycline  (VIBRAMYCIN) 100 MG capsule Take 100 mg by mouth 2 (two) times daily. (Patient not taking: Reported on 11/18/2019)    . ibuprofen (ADVIL) 200 MG tablet Take by mouth. (Patient not taking: Reported on 11/18/2019)    . ketorolac (TORADOL) 10 MG tablet Take 1 tablet (10 mg total) by mouth every 8 (eight) hours as needed for severe pain. (Patient not taking: Reported on 11/18/2019) 20 tablet 0  . meloxicam (MOBIC) 15 MG tablet meloxicam 15 mg tablet (Patient not taking: Reported on 11/18/2019)    . naloxone (NARCAN) 4 MG/0.1ML LIQD nasal spray kit Narcan 4 mg/actuation nasal spray (Patient not taking: Reported on 11/18/2019)    . oxyCODONE-acetaminophen (PERCOCET) 7.5-325 MG tablet oxycodone-acetaminophen 7.5 mg-325 mg tablet  TAKE 1 TABLET BY MOUTH EVERY 6 HOURS AS NEEDED FOR UP TO 7 DAYS FOR SEVERE PAIN (Patient not taking: Reported on 11/18/2019)    . pregabalin (LYRICA) 100 MG capsule Take by mouth. (Patient not taking: Reported on 11/18/2019)    . traMADol (ULTRAM) 50 MG tablet tramadol 50 mg tablet  TAKE 1 TABLET BY MOUTH EVERY 6 HOURS AS NEEDED FOR PAIN (Patient not taking: Reported on 11/18/2019)     No current facility-administered medications for this visit.     Musculoskeletal: Strength & Muscle Tone: UTA Gait & Station: UTA Patient leans: N/A  Psychiatric Specialty Exam: Review of Systems  Genitourinary:       Has swollen lymphnode in her groin  All other systems reviewed and are negative.   There were no vitals taken for this visit.There is no height or weight on file to calculate BMI.  General Appearance: UTA  Eye Contact:  UTA  Speech:  Clear and Coherent  Volume:  Normal  Mood:  Anxious  Affect:  UTA  Thought Process:  Goal Directed and Descriptions of Associations: Intact  Orientation:  Full (Time, Place, and Person)  Thought Content: Logical   Suicidal Thoughts:  No  Homicidal Thoughts:  No  Memory:  Immediate;   Fair Recent;   Fair Remote;   Fair  Judgement:  Fair  Insight:   Fair  Psychomotor Activity:  UTA  Concentration:  Concentration: Fair and Attention Span: Fair  Recall:  [place] of Knowledge: Fair  Language: Fair  Akathisia:  No  Handed:  Right  AIMS (if indicated): UTA  Assets:  Communication Skills Desire for Improvement Housing Social Support  ADL's:  Intact  Cognition: WNL  Sleep:  Fair   Screenings:   Assessment and Plan: Jennifer Welch is a 59 year old African-American female, married, on disability, employed, lives in [place] with her husband, has a history of bipolar disorder, generalized anxiety disorder, IBS, multiple surgeries, gastroesophageal reflux disease, carpal tunnel syndrome, chronic back pain, cocaine use disorder in remission was evaluated by phone today.  Patient is biologically predisposed given her history of trauma, substance abuse problems.  Patient with multiple medical problems, physical limitations, continues to do well on the current medication regimen.  Plan as noted below.  Plan Bipolar disorder in remission Trileptal 600 mg p.o. daily Lexapro 10 mg p.o. daily Zyprexa 10 mg p.o. nightly.  Discussed tapering the Zyprexa down due to long-term side effects including metabolic syndrome.  Patient is not interested.   GAD-stable Continue hydroxyzine 25 mg p.o. twice daily as needed for severe anxiety symptoms Lexapro 10 mg p.o. daily Continue CBT with Mr. Eulas Post  Cocaine use disorder in early remission-we will monitor closely  I have reviewed and discussed the following labs with patient including TSH, lipid panel, hemoglobin A1c, CBC, CMP dated 09/08/2019.  Follow-up in clinic in 6 to 8 weeks or sooner if needed.  I have spent atleast 20 minutes non face to face with patient today. More than 50 % of the time was spent for preparing to see the patient ( e.g., review of test, records ), ordering medications and test ,psychoeducation and supportive psychotherapy and care coordination,as well as documenting  clinical information in electronic health record,interpreting results of test and communication of results. This note was generated in part or whole with voice recognition software. Voice recognition is usually quite accurate but there are transcription errors that can and very often do occur. I apologize for any typographical errors that were not detected and corrected.       Ursula Alert, MD 11/19/2019, 7:50 AM

## 2019-11-19 ENCOUNTER — Other Ambulatory Visit: Payer: Self-pay | Admitting: Infectious Diseases

## 2019-11-19 DIAGNOSIS — I889 Nonspecific lymphadenitis, unspecified: Secondary | ICD-10-CM

## 2019-11-29 ENCOUNTER — Other Ambulatory Visit: Payer: Self-pay | Admitting: Infectious Diseases

## 2019-11-29 DIAGNOSIS — I889 Nonspecific lymphadenitis, unspecified: Secondary | ICD-10-CM

## 2019-11-30 ENCOUNTER — Ambulatory Visit
Admission: RE | Admit: 2019-11-30 | Discharge: 2019-11-30 | Disposition: A | Discharge status: Home / Self Care | Payer: Medicare Other | Source: Ambulatory Visit | Attending: Infectious Diseases | Admitting: Infectious Diseases

## 2019-11-30 ENCOUNTER — Other Ambulatory Visit: Payer: Self-pay

## 2019-11-30 DIAGNOSIS — I889 Nonspecific lymphadenitis, unspecified: Secondary | ICD-10-CM | POA: Insufficient documentation

## 2020-01-17 ENCOUNTER — Other Ambulatory Visit: Payer: Self-pay | Admitting: Psychiatry

## 2020-01-17 DIAGNOSIS — F411 Generalized anxiety disorder: Secondary | ICD-10-CM

## 2020-01-19 ENCOUNTER — Other Ambulatory Visit: Payer: Self-pay

## 2020-01-19 ENCOUNTER — Telehealth (INDEPENDENT_AMBULATORY_CARE_PROVIDER_SITE_OTHER): Payer: Medicare Other | Admitting: Psychiatry

## 2020-01-19 ENCOUNTER — Encounter: Payer: Self-pay | Admitting: Psychiatry

## 2020-01-19 DIAGNOSIS — F1420 Cocaine dependence, uncomplicated: Secondary | ICD-10-CM

## 2020-01-19 DIAGNOSIS — F411 Generalized anxiety disorder: Secondary | ICD-10-CM | POA: Diagnosis not present

## 2020-01-19 DIAGNOSIS — F3161 Bipolar disorder, current episode mixed, mild: Secondary | ICD-10-CM

## 2020-01-19 DIAGNOSIS — F3178 Bipolar disorder, in full remission, most recent episode mixed: Secondary | ICD-10-CM

## 2020-01-19 MED ORDER — OLANZAPINE 15 MG PO TABS: 15.0000 mg | ORAL_TABLET | Freq: Every day | ORAL | 1 refills | Status: DC

## 2020-01-19 MED ORDER — ESCITALOPRAM OXALATE 10 MG PO TABS: 10.0000 mg | ORAL_TABLET | Freq: Every day | ORAL | 1 refills | Status: DC

## 2020-01-19 MED ORDER — OXCARBAZEPINE 600 MG PO TABS: 600.0000 mg | ORAL_TABLET | Freq: Every day | ORAL | 1 refills | Status: DC

## 2020-01-19 NOTE — Progress Notes (Addendum)
Virtual Visit via Telephone Note  I connected with Jennifer Welch on 01/19/20 at  2:40 PM EDT by telephone and verified that I am speaking with the correct person using two identifiers.  Location Provider Location : [facility] Patient Location : Home  Participants: Patient , Provider   I discussed the limitations, risks, security and privacy concerns of performing an evaluation and management service by telephone and the availability of in person appointments. I also discussed with the patient that there may be a patient responsible charge related to this service. The patient expressed understanding and agreed to proceed.    I discussed the assessment and treatment plan with the patient. The patient was provided an opportunity to ask questions and all were answered. The patient agreed with the plan and demonstrated an understanding of the instructions.   The patient was advised to call back or seek an in-person evaluation if the symptoms worsen or if the condition fails to improve as anticipated.  [facility] Yates MD OP Progress Note  01/19/2020 5:50 PM Jennifer Welch  MRN:  [id number]  Chief Complaint:  Chief Complaint    Follow-up     HPI: Jennifer Welch is a 59 year old African-American female, married, lives in [place], has a history of bipolar disorder, GAD, cocaine use disorder, gastroesophageal reflux disease, IBS, recurrent lipomas, carpal tunnel syndrome was evaluated by phone today.  Patient today reports she is currently struggling with inflamed lymph node in her groin.  She is currently on doxycycline.  She reports it is helping to some extent however it continues to be very painful for her.  She hence is struggling with a lot of anxiety.  She reports she has racing thoughts at night and is unable to sleep.  She denies any suicidality, homicidality or perceptual disturbances.  She is currently taking olanzapine, Lexapro and Trileptal.  Discussed changing her Lexapro to Cymbalta however  patient is not interested.  She is however agreeable to increasing the dosage of olanzapine at bedtime.  Patient denies any side effects to her medications.  Visit Diagnosis:    ICD-10-CM   1. Bipolar 1 disorder, mixed, mild (HCC)  F31.61 OLANZapine (ZYPREXA) 15 MG tablet    oxcarbazepine (TRILEPTAL) 600 MG tablet  2. GAD (generalized anxiety disorder)  F41.1 escitalopram (LEXAPRO) 10 MG tablet    OLANZapine (ZYPREXA) 15 MG tablet  3. Cocaine use disorder, moderate, dependence ([place])  F14.20     Past Psychiatric History: I have reviewed past psychiatric history from my progress note on 04/06/2019.  Past trials of Seroquel, Trileptal, Prozac, Lexapro, Xanax, Klonopin, trazodone  Past Medical History:  Past Medical History:  Diagnosis Date  . Anxiety   . Bipolar 1 disorder ([place])   . Depression   . Hypertensive disorder 02/09/2019  . Neuromuscular disorder [facility])    CERVICAL DDD    Past Surgical History:  Procedure Laterality Date  . ABDOMINAL HYSTERECTOMY    . BONE EXCISION Right 12/15/2018   Procedure: PARTIAL EXCISION BONE-PHALANX RIGHT WITH FLUOROSCOPY;  Surgeon: Caroline More, DPM;  Location: [facility];  Service: Podiatry;  Laterality: Right;  MAC ANESTHESIA  . BREAST SURGERY    . ECTOPIC PREGNANCY SURGERY    . FOOT SURGERY    . HAND TENDON SURGERY    . KNEE SURGERY    . OVARIAN CYST REMOVAL      Family Psychiatric History: I have reviewed family psychiatric history from my progress note on 04/06/2019  Family History:  Family History  Problem Relation Age of  Onset  . Diabetes Mother   . Alcohol abuse Father   . Colon cancer Father   . Alcohol abuse Brother   . Hypertension Brother     Social History: I have reviewed social history from my progress note on 04/06/2019 Social History   Socioeconomic History  . Marital status: Married    Spouse name: darwin Freeze  . Number of children: 3  . Years of education: Not on file  . Highest education level: Some  college, no degree  Occupational History  . Not on file  Tobacco Use  . Smoking status: Former Smoker    Types: Cigarettes  . Smokeless tobacco: Never Used  Vaping Use  . Vaping Use: Never used  Substance and Sexual Activity  . Alcohol use: Not Currently  . Drug use: Never  . Sexual activity: Not Currently  Other Topics Concern  . Not on file  Social History Narrative  . Not on file   Social Determinants of Health   Financial Resource Strain:   . Difficulty of Paying Living Expenses: Not on file  Food Insecurity:   . Worried About Charity fundraiser in the Last Year: Not on file  . Ran Out of Food in the Last Year: Not on file  Transportation Needs:   . Lack of Transportation (Medical): Not on file  . Lack of Transportation (Non-Medical): Not on file  Physical Activity:   . Days of Exercise per Week: Not on file  . Minutes of Exercise per Session: Not on file  Stress:   . Feeling of Stress : Not on file  Social Connections:   . Frequency of Communication with Friends and Family: Not on file  . Frequency of Social Gatherings with Friends and Family: Not on file  . Attends Religious Services: Not on file  . Active Member of Clubs or Organizations: Not on file  . Attends Archivist Meetings: Not on file  . Marital Status: Not on file    Allergies:  Allergies  Allergen Reactions  . Phentermine Other (See Comments)    Vivid dreams/DRUG INTERACTION NOT ALLERGIC Vivid dreams/DRUG INTERACTION NOT ALLERGIC   . Quinolones Nausea Only and Other (See Comments)  . Macrobid [Nitrofurantoin Macrocrystal] Rash  . Septra [Sulfamethoxazole-Trimethoprim] Rash    Metabolic Disorder Labs: Lab Results  Component Value Date   HGBA1C 5.6 09/08/2019   MPG 114 09/08/2019   No results found for: PROLACTIN Lab Results  Component Value Date   CHOL 198 09/08/2019   TRIG 33 09/08/2019   HDL 88 09/08/2019   CHOLHDL 2.3 09/08/2019   VLDL 7 09/08/2019   LDLCALC 103 (H)  09/08/2019   Lab Results  Component Value Date   TSH 0.704 09/08/2019    Therapeutic Level Labs: No results found for: LITHIUM No results found for: VALPROATE No components found for:  CBMZ  Current Medications: Current Outpatient Medications  Medication Sig Dispense Refill  . promethazine (PHENERGAN) 25 MG tablet Take by mouth.    Marland Kitchen ascorbic acid (VITAMIN C) 1000 MG tablet Take by mouth.    . benzonatate (TESSALON PERLES) 100 MG capsule Take 1 capsule (100 mg total) by mouth every 6 (six) hours as needed for cough. (Patient not taking: Reported on 11/18/2019) 30 capsule 0  . Cholecalciferol 25 MCG (1000 UT) tablet Take 400 Units by mouth.     . diclofenac Sodium (VOLTAREN) 1 % GEL Apply topically.    Marland Kitchen doxycycline (VIBRAMYCIN) 100 MG capsule Take 100 mg  by mouth 2 (two) times daily. (Patient not taking: Reported on 11/18/2019)    . erythromycin ophthalmic ointment SMARTSIG:In Eye(s)    . escitalopram (LEXAPRO) 10 MG tablet Take 1 tablet (10 mg total) by mouth daily. 30 tablet 1  . fexofenadine (ALLEGRA) 180 MG tablet Take by mouth.    . gabapentin (NEURONTIN) 100 MG capsule Take 100 mg by mouth 3 (three) times daily.    . hydrOXYzine (ATARAX/VISTARIL) 25 MG tablet TAKE 1 TABLET BY MOUTH THREE TIMES DAILY AS NEEDED FOR  SEVERE  ANXIETY  SYMPTOMS 90 tablet 0  . ibuprofen (ADVIL) 200 MG tablet Take by mouth. (Patient not taking: Reported on 11/18/2019)    . ketorolac (TORADOL) 10 MG tablet Take 1 tablet (10 mg total) by mouth every 8 (eight) hours as needed for severe pain. (Patient not taking: Reported on 11/18/2019) 20 tablet 0  . meloxicam (MOBIC) 15 MG tablet meloxicam 15 mg tablet (Patient not taking: Reported on 11/18/2019)    . naloxone (NARCAN) 4 MG/0.1ML LIQD nasal spray kit Narcan 4 mg/actuation nasal spray (Patient not taking: Reported on 11/18/2019)    . OLANZapine (ZYPREXA) 15 MG tablet Take 1 tablet (15 mg total) by mouth at bedtime. 30 tablet 1  . Omega-3 1000 MG CAPS Take by mouth.     . ondansetron (ZOFRAN) 4 MG tablet Take 1 tablet (4 mg total) by mouth every 8 (eight) hours as needed for nausea or vomiting. 20 tablet 0  . oxcarbazepine (TRILEPTAL) 600 MG tablet Take 1 tablet (600 mg total) by mouth daily. 30 tablet 1  . oxyCODONE-acetaminophen (PERCOCET) 7.5-325 MG tablet oxycodone-acetaminophen 7.5 mg-325 mg tablet  TAKE 1 TABLET BY MOUTH EVERY 6 HOURS AS NEEDED FOR UP TO 7 DAYS FOR SEVERE PAIN (Patient not taking: Reported on 11/18/2019)    . pantoprazole (PROTONIX) 40 MG tablet Take 40 mg by mouth daily.    . pregabalin (LYRICA) 100 MG capsule Take by mouth. (Patient not taking: Reported on 11/18/2019)    . pregabalin (LYRICA) 100 MG capsule Take 100 mg by mouth 3 (three) times daily.    Marland Kitchen tiZANidine (ZANAFLEX) 2 MG tablet tizanidine 2 mg tablet  TAKE 1 TABLET BY MOUTH THREE TIMES DAILY    . traMADol (ULTRAM) 50 MG tablet tramadol 50 mg tablet  TAKE 1 TABLET BY MOUTH EVERY 6 HOURS AS NEEDED FOR PAIN (Patient not taking: Reported on 11/18/2019)     No current facility-administered medications for this visit.     Musculoskeletal: Strength & Muscle Tone: UTA Gait & Station: UTA Patient leans: N/A  Psychiatric Specialty Exam: Review of Systems  Genitourinary:       Mass in her left groin - past 4 months- burning, painful  Psychiatric/Behavioral: Positive for sleep disturbance. The patient is nervous/anxious.   All other systems reviewed and are negative.   There were no vitals taken for this visit.There is no height or weight on file to calculate BMI.  General Appearance: UTA  Eye Contact:  UTA  Speech:  Clear and Coherent  Volume:  Normal  Mood:  Anxious  Affect:  UTA  Thought Process:  Goal Directed and Descriptions of Associations: Intact  Orientation:  Full (Time, Place, and Person)  Thought Content: Logical   Suicidal Thoughts:  No  Homicidal Thoughts:  No  Memory:  Immediate;   Fair Recent;   Fair Remote;   Fair  Judgement:  Fair  Insight:  Fair   Psychomotor Activity:  UTA  Concentration:  Concentration:  Fair and Attention Span: Fair  Recall:  [employer] of Knowledge: Fair  Language: Fair  Akathisia:  No  Handed:  Right  AIMS (if indicated): UTA  Assets:  Communication Skills Desire for Improvement Housing Social Support  ADL's:  Intact  Cognition: WNL  Sleep:  Restless   Screenings:   Assessment and Plan: Peta Hodsdon is a 59 year old African-American female, married, on disability, lives in [place] with her husband, has a history of bipolar disorder, GAD, IBS, chronic back pain, multiple surgeries, cocaine use disorder in remission was evaluated by phone today.  Patient is currently struggling with anxiety and sleep problems more so because of her current health issues.  Discussed plan as noted below.  Plan Bipolar disorder-mixed, mild-unstable Increase Zyprexa to 15 mg p.o. nightly Trileptal 600 mg p.o. daily Continue Lexapro as prescribed  GAD-unstable Continue hydroxyzine 25 mg p.o. twice daily as needed Continue CBT Continue Lexapro 10 mg p.o. daily.  She declines medication changes for anxiety.  Cocaine use disorder in early remission-we will monitor closely  Follow-up in clinic in 4 to 6 weeks or sooner if needed.  I have spent atleast 20 minutes non face to face  with patient today. More than 50 % of the time was spent for preparing to see the patient ( e.g., review of test, records ),  ordering medications and test ,psychoeducation and supportive psychotherapy and care coordination,as well as documenting clinical information in electronic health record. This note was generated in part or whole with voice recognition software. Voice recognition is usually quite accurate but there are transcription errors that can and very often do occur. I apologize for any typographical errors that were not detected and corrected.        Ursula Alert, MD 01/20/2020, 8:56 AM

## 2020-02-22 ENCOUNTER — Telehealth: Payer: Medicare Other | Admitting: Psychiatry

## 2020-02-24 ENCOUNTER — Telehealth (INDEPENDENT_AMBULATORY_CARE_PROVIDER_SITE_OTHER): Payer: Medicare Other | Admitting: Psychiatry

## 2020-02-24 ENCOUNTER — Encounter: Payer: Self-pay | Admitting: Psychiatry

## 2020-02-24 ENCOUNTER — Other Ambulatory Visit: Payer: Self-pay

## 2020-02-24 DIAGNOSIS — F3161 Bipolar disorder, current episode mixed, mild: Secondary | ICD-10-CM | POA: Insufficient documentation

## 2020-02-24 DIAGNOSIS — F3177 Bipolar disorder, in partial remission, most recent episode mixed: Secondary | ICD-10-CM

## 2020-02-24 DIAGNOSIS — F411 Generalized anxiety disorder: Secondary | ICD-10-CM

## 2020-02-24 DIAGNOSIS — F1420 Cocaine dependence, uncomplicated: Secondary | ICD-10-CM

## 2020-02-24 DIAGNOSIS — F1421 Cocaine dependence, in remission: Secondary | ICD-10-CM | POA: Diagnosis not present

## 2020-02-24 MED ORDER — OXCARBAZEPINE 600 MG PO TABS: 600.0000 mg | ORAL_TABLET | Freq: Every day | ORAL | 1 refills | Status: DC

## 2020-02-24 MED ORDER — OLANZAPINE 15 MG PO TABS: 15.0000 mg | ORAL_TABLET | Freq: Every day | ORAL | 1 refills | Status: DC

## 2020-02-24 MED ORDER — ESCITALOPRAM OXALATE 10 MG PO TABS: 10.0000 mg | ORAL_TABLET | Freq: Every day | ORAL | 1 refills | Status: DC

## 2020-02-24 MED ORDER — HYDROXYZINE HCL 25 MG PO TABS: ORAL_TABLET | ORAL | 0 refills | Status: DC

## 2020-02-24 NOTE — Progress Notes (Unsigned)
Virtual Visit via Telephone Welch  I connected with Jennifer Welch on 02/24/20 at 10:40 AM EST by telephone and verified that I am speaking with the correct person using two identifiers.  Location Provider Location : [facility] Patient Location : Home  Participants: Patient , Provider    I discussed the limitations, risks, security and privacy concerns of performing an evaluation and management service by telephone and the availability of in person appointments. I also discussed with the patient that there may be a patient responsible charge related to this service. The patient expressed understanding and agreed to proceed.    I discussed the assessment and treatment plan with the patient. The patient was provided an opportunity to ask questions and all were answered. The patient agreed with the plan and demonstrated an understanding of the instructions.   The patient was advised to call back or seek an in-person evaluation if the symptoms worsen or if the condition fails to improve as anticipated.   Jennifer Welch  02/24/2020 10:43 AM Jennifer Welch  MRN:  [id number]  Chief Complaint:  Chief Complaint    Follow-up     HPI: Jennifer Welch is a 59 year old African-American female, married, lives in [place], has a history of bipolar disorder, GAD, cocaine use disorder, gastroesophageal reflux disease, IBS, recurrent lipomas, carpal tunnel syndrome was evaluated by phone today.  Patient today reports she continues to struggle with groin pain however she reports it is getting better.  She continues to follow-up with her providers.  Patient reports mood wise she is doing okay.  She denies any significant mood lability.  She reports she takes a few hours to fall asleep.  She takes her medications around 9 PM however falls asleep only by around midnight.  She reports she watches TV or plays on her phone until she can fall asleep.  Patient denies any suicidality, homicidality or  perceptual disturbances.  She is compliant on her medications.  She reports she struggles with increased appetite likely due to olanzapine.  Patient reports she had a good time with her family during Thanksgiving holiday.  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  Work can be stressful.  It also causes her back pain due to long hours of standing.  She however reports she is coping okay.  She denies any other concerns today.  Visit Diagnosis:    ICD-10-CM   1. Bipolar disorder, in partial remission, most recent episode mixed (HCC)  F31.77 oxcarbazepine (TRILEPTAL) 600 MG tablet    OLANZapine (ZYPREXA) 15 MG tablet   mild  2. GAD (generalized anxiety disorder)  F41.1 OLANZapine (ZYPREXA) 15 MG tablet    hydrOXYzine (ATARAX/VISTARIL) 25 MG tablet    escitalopram (LEXAPRO) 10 MG tablet  3. Cocaine use disorder, moderate, in early remission ([place])  F14.21     Past Psychiatric History: I have reviewed past psychiatric history from my progress Welch on 04/06/2019.  Past trials of Seroquel, Trileptal, Prozac, Lexapro, Xanax, Klonopin, trazodone.  Past Medical History:  Past Medical History:  Diagnosis Date  . Anxiety   . Bipolar 1 disorder ([place])   . Depression   . Hypertensive disorder 02/09/2019  . Neuromuscular disorder [facility])    CERVICAL DDD    Past Surgical History:  Procedure Laterality Date  . ABDOMINAL HYSTERECTOMY    . BONE EXCISION Right 12/15/2018   Procedure: PARTIAL EXCISION BONE-PHALANX RIGHT WITH FLUOROSCOPY;  Surgeon: Caroline More, DPM;  Location: [facility];  Service: Podiatry;  Laterality:  Right;  MAC ANESTHESIA  . BREAST SURGERY    . ECTOPIC PREGNANCY SURGERY    . FOOT SURGERY    . HAND TENDON SURGERY    . KNEE SURGERY    . OVARIAN CYST REMOVAL      Family Psychiatric History: I have reviewed family psychiatric history from my progress Welch on 04/06/2019  Family History:  Family History  Problem Relation Age of Onset  . Diabetes Mother   .  Alcohol abuse Father   . Colon cancer Father   . Alcohol abuse Brother   . Hypertension Brother     Social History: I have reviewed social history from my progress Welch on 04/06/2019 Social History   Socioeconomic History  . Marital status: Married    Spouse name: darwin Voth  . Number of children: 3  . Years of education: Not on file  . Highest education level: Some college, no degree  Occupational History  . Not on file  Tobacco Use  . Smoking status: Former Smoker    Types: Cigarettes  . Smokeless tobacco: Never Used  Vaping Use  . Vaping Use: Never used  Substance and Sexual Activity  . Alcohol use: Not Currently  . Drug use: Never  . Sexual activity: Not Currently  Other Topics Concern  . Not on file  Social History Narrative  . Not on file   Social Determinants of Health   Financial Resource Strain: Not on file  Food Insecurity: Not on file  Transportation Needs: Not on file  Physical Activity: Not on file  Stress: Not on file  Social Connections: Not on file    Allergies:  Allergies  Allergen Reactions  . Phentermine Other (See Comments)    Vivid dreams/DRUG INTERACTION NOT ALLERGIC Vivid dreams/DRUG INTERACTION NOT ALLERGIC   . Quinolones Nausea Only and Other (See Comments)  . Macrobid [Nitrofurantoin Macrocrystal] Rash  . Septra [Sulfamethoxazole-Trimethoprim] Rash    Metabolic Disorder Labs: Lab Results  Component Value Date   HGBA1C 5.6 09/08/2019   MPG 114 09/08/2019   No results found for: PROLACTIN Lab Results  Component Value Date   CHOL 198 09/08/2019   TRIG 33 09/08/2019   HDL 88 09/08/2019   CHOLHDL 2.3 09/08/2019   VLDL 7 09/08/2019   LDLCALC 103 (H) 09/08/2019   Lab Results  Component Value Date   TSH 0.704 09/08/2019    Therapeutic Level Labs: No results found for: LITHIUM No results found for: VALPROATE No components found for:  CBMZ  Current Medications: Current Outpatient Medications  Medication Sig Dispense  Refill  . ascorbic acid (VITAMIN C) 1000 MG tablet Take by mouth.    . benzonatate (TESSALON PERLES) 100 MG capsule Take 1 capsule (100 mg total) by mouth every 6 (six) hours as needed for cough. (Patient not taking: Reported on 11/18/2019) 30 capsule 0  . Cholecalciferol 25 MCG (1000 UT) tablet Take 400 Units by mouth.     . diclofenac Sodium (VOLTAREN) 1 % GEL Apply topically.    Marland Kitchen doxycycline (VIBRAMYCIN) 100 MG capsule Take 100 mg by mouth 2 (two) times daily. (Patient not taking: Reported on 11/18/2019)    . erythromycin ophthalmic ointment SMARTSIG:In Eye(s)    . escitalopram (LEXAPRO) 10 MG tablet Take 1 tablet (10 mg total) by mouth daily. 30 tablet 1  . fexofenadine (ALLEGRA) 180 MG tablet Take by mouth.    . gabapentin (NEURONTIN) 100 MG capsule Take 100 mg by mouth 3 (three) times daily.    Marland Kitchen  hydrOXYzine (ATARAX/VISTARIL) 25 MG tablet Take 1 tablet three times daily by mouth 90 tablet 0  . ibuprofen (ADVIL) 200 MG tablet Take by mouth. (Patient not taking: Reported on 11/18/2019)    . ketorolac (TORADOL) 10 MG tablet Take 1 tablet (10 mg total) by mouth every 8 (eight) hours as needed for severe pain. (Patient not taking: Reported on 11/18/2019) 20 tablet 0  . meloxicam (MOBIC) 15 MG tablet meloxicam 15 mg tablet (Patient not taking: Reported on 11/18/2019)    . naloxone (NARCAN) 4 MG/0.1ML LIQD nasal spray kit Narcan 4 mg/actuation nasal spray (Patient not taking: Reported on 11/18/2019)    . OLANZapine (ZYPREXA) 15 MG tablet Take 1 tablet (15 mg total) by mouth at bedtime. 30 tablet 1  . Omega-3 1000 MG CAPS Take by mouth.    . ondansetron (ZOFRAN) 4 MG tablet Take 1 tablet (4 mg total) by mouth every 8 (eight) hours as needed for nausea or vomiting. 20 tablet 0  . oxcarbazepine (TRILEPTAL) 600 MG tablet Take 1 tablet (600 mg total) by mouth daily. 30 tablet 1  . oxyCODONE-acetaminophen (PERCOCET) 7.5-325 MG tablet oxycodone-acetaminophen 7.5 mg-325 mg tablet  TAKE 1 TABLET BY MOUTH EVERY 6  HOURS AS NEEDED FOR UP TO 7 DAYS FOR SEVERE PAIN (Patient not taking: Reported on 11/18/2019)    . pantoprazole (PROTONIX) 40 MG tablet Take 40 mg by mouth daily.    . pregabalin (LYRICA) 100 MG capsule Take by mouth. (Patient not taking: Reported on 11/18/2019)    . pregabalin (LYRICA) 100 MG capsule Take 100 mg by mouth 3 (three) times daily.    . promethazine (PHENERGAN) 25 MG tablet Take by mouth.    Marland Kitchen tiZANidine (ZANAFLEX) 2 MG tablet tizanidine 2 mg tablet  TAKE 1 TABLET BY MOUTH THREE TIMES DAILY    . traMADol (ULTRAM) 50 MG tablet tramadol 50 mg tablet  TAKE 1 TABLET BY MOUTH EVERY 6 HOURS AS NEEDED FOR PAIN (Patient not taking: Reported on 11/18/2019)     No current facility-administered medications for this visit.     Musculoskeletal: Strength & Muscle Tone: UTA Gait & Station: UTA Patient leans: N/A  Psychiatric Specialty Exam: Review of Systems  Genitourinary:       Left sided groin pain  Musculoskeletal: Positive for back pain.  Psychiatric/Behavioral: Negative for agitation, behavioral problems, confusion, decreased concentration, dysphoric mood, hallucinations, self-injury, sleep disturbance and suicidal ideas. The patient is not hyperactive.   All other systems reviewed and are negative.   There were no vitals taken for this visit.There is no height or weight on file to calculate BMI.  General Appearance: UTA  Eye Contact:  UTA  Speech:  Clear and Coherent  Volume:  Normal  Mood:  Euthymic  Affect:  UTA  Thought Process:  Goal Directed and Descriptions of Associations: Intact  Orientation:  Full (Time, Place, and Person)  Thought Content: Logical   Suicidal Thoughts:  No  Homicidal Thoughts:  No  Memory:  Immediate;   Fair Recent;   Fair Remote;   Fair  Judgement:  Fair  Insight:  Fair  Psychomotor Activity:  UTA  Concentration:  Concentration: Fair and Attention Span: Fair  Recall:  Jennifer Welch: Fair  Language: Fair  Akathisia:  No  Handed:   Right  AIMS (if indicated): UTA  Assets:  Communication Skills Desire for Improvement Housing Social Support  ADL's:  Intact  Cognition: WNL  Sleep:  Fair   Screenings:   Assessment  and Plan: Jennifer Welch is a 59 year old African-American female, married, on disability, lives in [place] with her husband, has a history of bipolar disorder, GAD, IBS, chronic back pain, multiple surgeries, cocaine use disorder in remission was evaluated by phone today.  Patient is currently making progress on the current medication regimen.  Plan as noted below.  Plan Bipolar disorder mixed-improving Olanzapine 15 mg p.o. nightly-patient is not interested in making changes to her olanzapine dosage.  Discussed with patient to make healthier choices since she currently struggles with increased appetite. Trileptal 600 mg p.o. daily Lexapro 10 mg p.o. daily  GAD-improving Continue CBT Hydroxyzine 25 mg p.o. twice daily as needed Lexapro 10 mg p.o. daily  Cocaine use disorder in remission-Will monitor closely  Patient to follow-up with her providers for her pain.  Follow-up in clinic in 4 to 5 weeks or sooner if needed.  I have spent atleast 20 minutes non face to face with patient today. More than 50 % of the time was spent for preparing to see the patient ( e.g., review of test, records ), ordering medications and test ,psychoeducation and supportive psychotherapy and care coordination,as well as documenting clinical information in electronic health record. This Welch was generated in part or whole with voice recognition software. Voice recognition is usually quite accurate but there are transcription errors that can and very often do occur. I apologize for any typographical errors that were not detected and corrected.      Ursula Alert, MD 02/25/2020, 7:58 AM

## 2020-03-10 IMAGING — MR MRI HEAD WITHOUT CONTRAST
9 of 10 series · 41 of 48 positions shown · non-contrast
Comparison: None.

CLINICAL DATA: Lip numbness with finger numbness and right lower
extremity numbness.

EXAM:
MRI HEAD WITHOUT CONTRAST
MRI CERVICAL SPINE WITHOUT CONTRAST
TECHNIQUE: Multiplanar, multiecho pulse sequences of the brain and surrounding
structures, and cervical spine, to include the craniocervical
junction and cervicothoracic junction, were obtained without
intravenous contrast.

[Series 3: ax dwi_tracew · axial · 3.0mm · 0.68mm/px · z∈[-61,+98]mm · 6 of 55 slices shown]
[im 1/55]
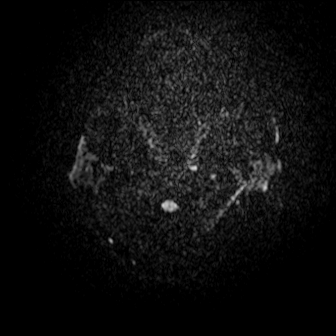
[im 11/55]
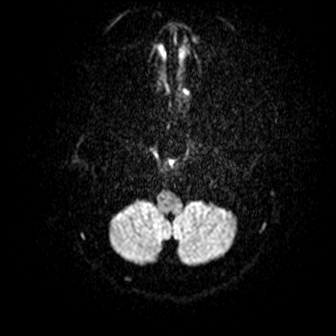
[im 22/55]
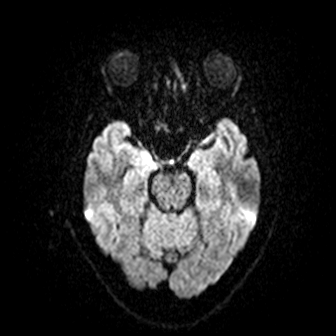
[im 33/55]
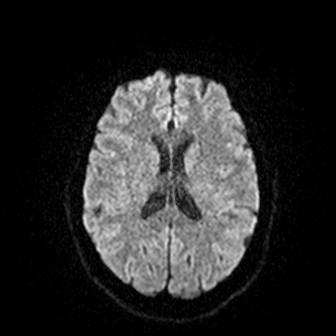
[im 44/55]
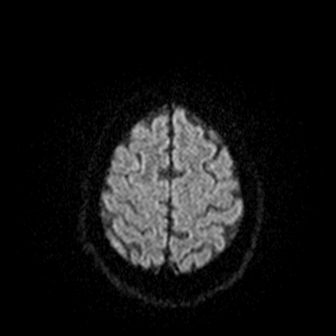
[im 55/55]
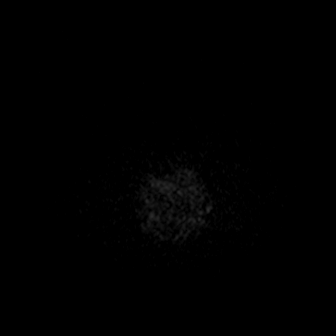

[Series 4: ax dwi_adc · axial · 3.0mm · 0.68mm/px · z∈[-61,+98]mm · 7 of 55 slices shown]
[im 1/55]
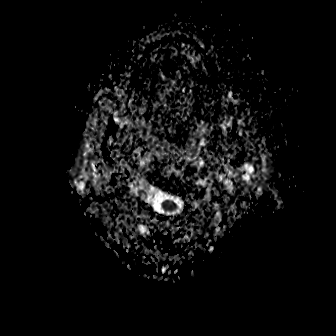
[im 10/55]
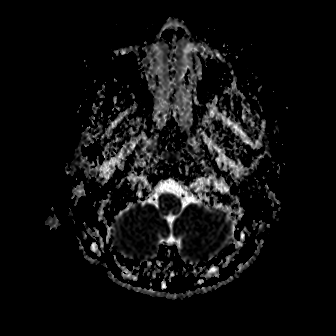
[im 19/55]
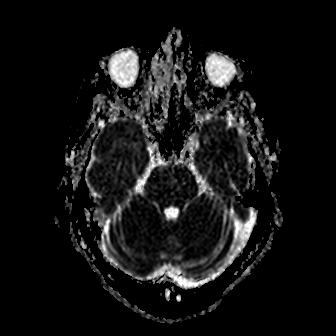
[im 28/55]
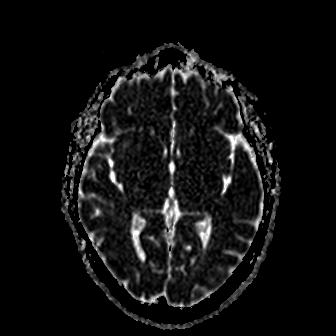
[im 37/55]
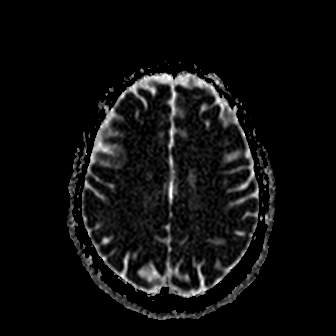
[im 46/55]
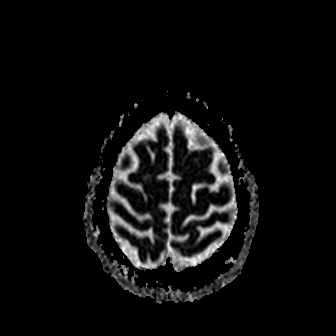
[im 55/55]
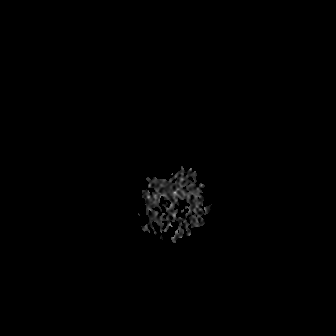

[Series 5: cor dwi_tracew · coronal · 5.0mm · 0.68mm/px · 6 of 42 slices shown]
[im 1/42]
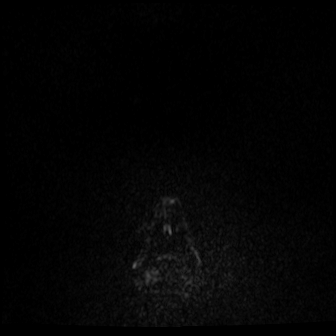
[im 9/42]
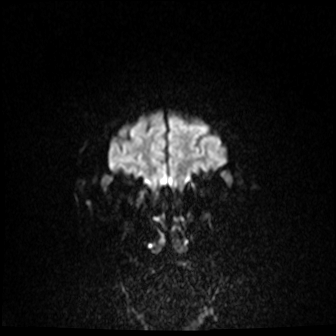
[im 17/42]
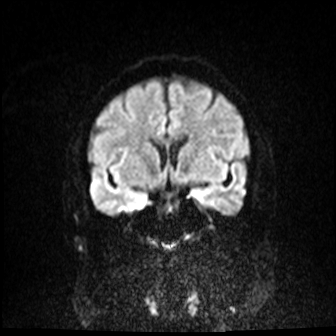
[im 25/42]
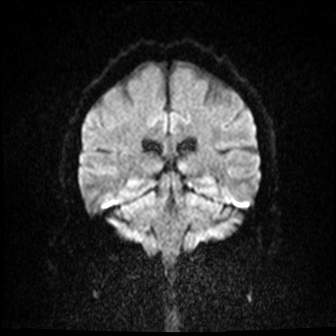
[im 33/42]
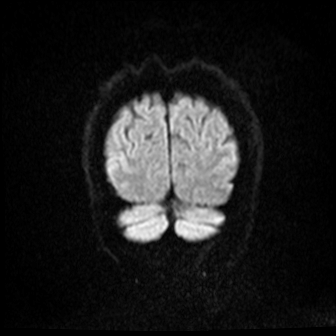
[im 42/42]
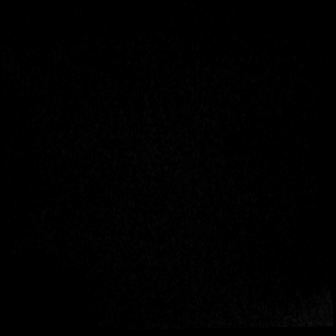

[Series 6: cor dwi_adc · coronal · 5.0mm · 0.68mm/px · 3 of 41 slices shown]
[im 1/41]
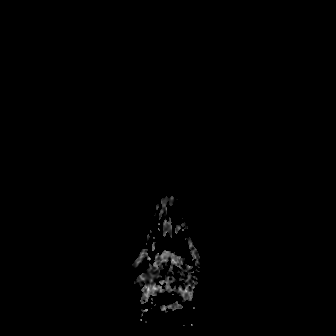
[im 9/41]
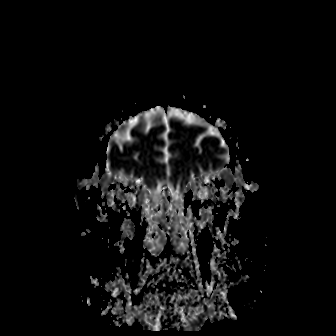
[im 17/41]
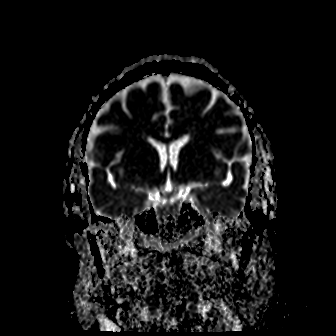

[Series 7: T1 · sagittal · 5.0mm · 0.90mm/px · 3 of 25 slices shown (1 of 2)]
[im 1/25]
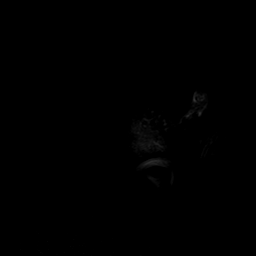
[im 13/25]
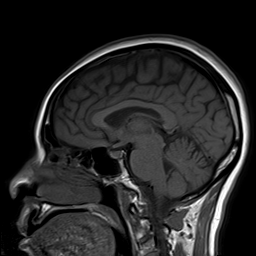
[im 25/25]
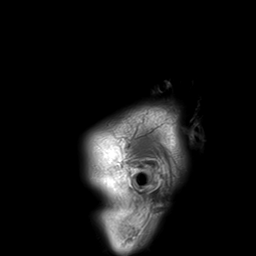

[Series 8: T2 · axial · 5.0mm · 0.45mm/px · z∈[-66,+88]mm · 4 of 27 slices shown (1 of 2)]
[im 1/27]
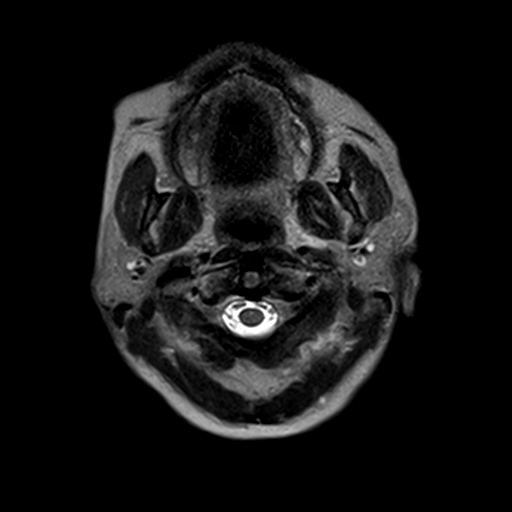
[im 9/27]
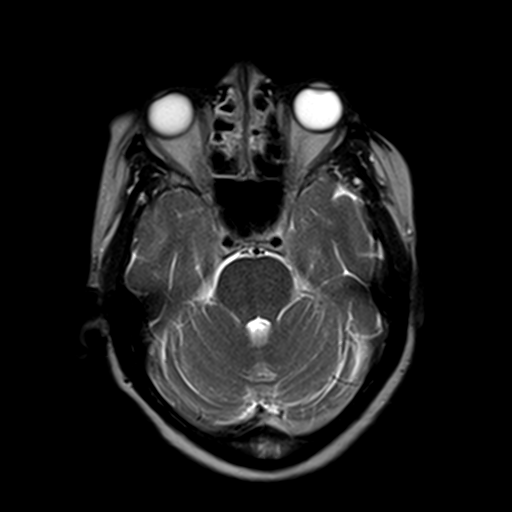
[im 18/27]
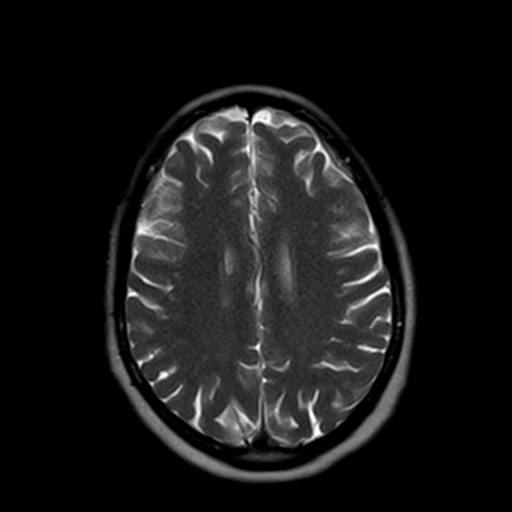
[im 27/27]
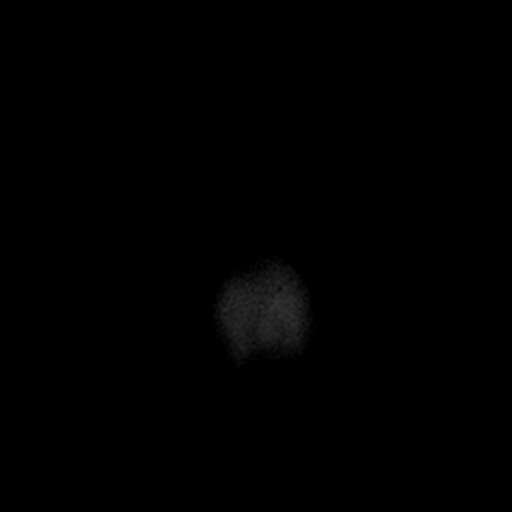

[Series 10: FLAIR · axial · 5.0mm · 1.20mm/px · z∈[-66,+88]mm · 4 of 27 slices shown]
[im 1/27]
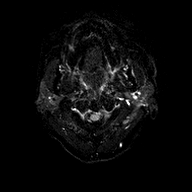
[im 9/27]
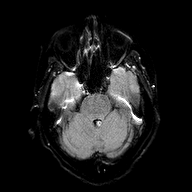
[im 18/27]
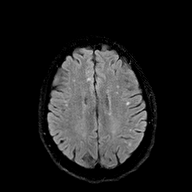
[im 27/27]
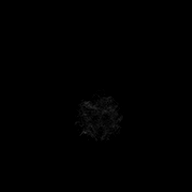

[Series 11: T1 · axial · 5.0mm · 0.90mm/px · z∈[-66,+88]mm · 4 of 27 slices shown (2 of 2)]
[im 1/27]
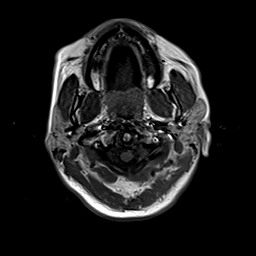
[im 9/27]
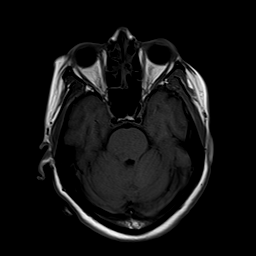
[im 18/27]
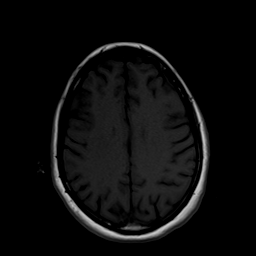
[im 27/27]
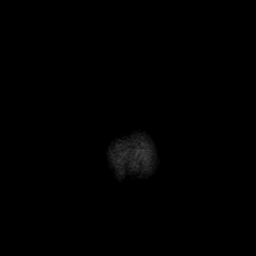

[Series 12: T2 · coronal · 5.0mm · 0.57mm/px · 4 of 29 slices shown (2 of 2)]
[im 1/29]
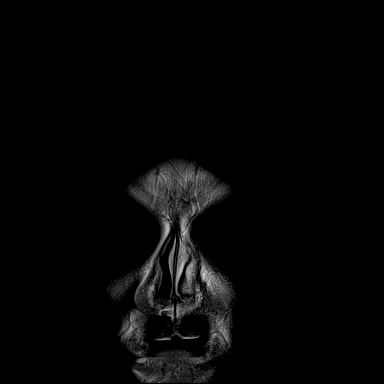
[im 10/29]
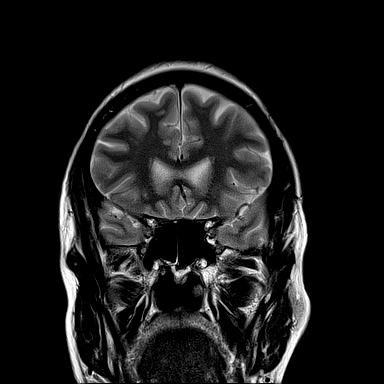
[im 19/29]
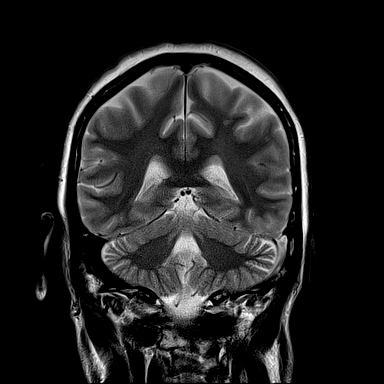
[im 29/29]
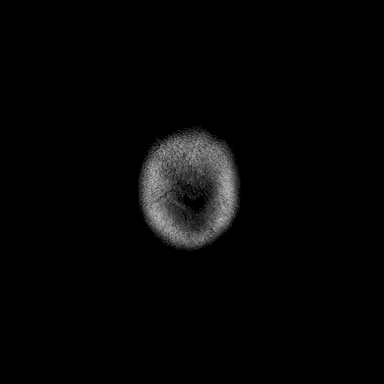

[41 of 48 positions shown; findings below may reference images not displayed]

FINDINGS: MRI HEAD FINDINGS

BRAIN: There is no acute infarct, acute hemorrhage, hydrocephalus or
extra-axial collection. The midline structures are normal. No
midline shift or other mass effect. Multifocal white matter
hyperintensity, most commonly due to chronic ischemic
microangiopathy. The cerebral and cerebellar volume are
age-appropriate. Susceptibility-sensitive sequences show no chronic
microhemorrhage or superficial siderosis.

VASCULAR: Major intracranial arterial and venous sinus flow voids
are normal.

SKULL AND UPPER CERVICAL SPINE: Calvarial bone marrow signal is
normal. There is no skull base mass. Visualized upper cervical spine
and soft tissues are normal.

SINUSES/ORBITS: No fluid levels or advanced mucosal thickening. No
mastoid or middle ear effusion. The orbits are normal.

MRI CERVICAL SPINE FINDINGS

Alignment: Reversal of normal cervical lordosis.

Vertebrae: No focal marrow lesion. No compression fracture or
evidence of discitis osteomyelitis.

Cord: Normal caliber and signal.

Posterior Fossa, vertebral arteries, paraspinal tissues: Visualized
posterior fossa is normal. Vertebral artery flow voids are
preserved. No prevertebral effusion.

Disc levels: Sagittal imaging includes the atlantoaxial joint to the
level of the T2-3 disc space, with axial imaging of the disc spaces
from C2-3 to C7-T1.

C1-2: Normal.

C2-3: Normal.

C3-4: Small disc bulge mildly narrowing the ventral thecal sac. No
spinal canal or neural foraminal stenosis.

C4-5: Bilobed subarticular disc protrusions, left-greater-than-right
with mild spinal canal stenosis. No neural foraminal stenosis.

C5-6: Mild disc osteophyte complex with mild left foraminal
stenosis. No spinal canal stenosis.

C6-7: Small disc bulge without spinal canal or neural foraminal
stenosis.

C7-T1: Normal.

T1-2: Normal.

T2-3: Normal.
IMPRESSION: 1. Intracranial findings of chronic ischemic microangiopathy without
acute abnormality.
2. Multilevel cervical degenerative disc disease without high-grade
stenosis.

## 2020-04-06 ENCOUNTER — Telehealth: Payer: Medicare Other | Admitting: Psychiatry

## 2020-05-13 ENCOUNTER — Other Ambulatory Visit: Payer: Self-pay | Admitting: Psychiatry

## 2020-05-13 DIAGNOSIS — F3177 Bipolar disorder, in partial remission, most recent episode mixed: Secondary | ICD-10-CM

## 2020-05-13 DIAGNOSIS — F411 Generalized anxiety disorder: Secondary | ICD-10-CM

## 2020-06-21 ENCOUNTER — Telehealth (INDEPENDENT_AMBULATORY_CARE_PROVIDER_SITE_OTHER): Payer: Medicare HMO | Admitting: Psychiatry

## 2020-06-21 ENCOUNTER — Other Ambulatory Visit: Payer: Self-pay

## 2020-06-21 ENCOUNTER — Encounter: Payer: Self-pay | Admitting: Psychiatry

## 2020-06-21 DIAGNOSIS — F1421 Cocaine dependence, in remission: Secondary | ICD-10-CM

## 2020-06-21 DIAGNOSIS — F411 Generalized anxiety disorder: Secondary | ICD-10-CM

## 2020-06-21 DIAGNOSIS — F3178 Bipolar disorder, in full remission, most recent episode mixed: Secondary | ICD-10-CM | POA: Diagnosis not present

## 2020-06-21 MED ORDER — OLANZAPINE 10 MG PO TABS: 10.0000 mg | ORAL_TABLET | Freq: Every day | ORAL | 1 refills | Status: DC

## 2020-06-21 MED ORDER — ESCITALOPRAM OXALATE 10 MG PO TABS: 10.0000 mg | ORAL_TABLET | Freq: Every day | ORAL | 0 refills | Status: DC

## 2020-06-21 MED ORDER — OXCARBAZEPINE 600 MG PO TABS: 600.0000 mg | ORAL_TABLET | Freq: Every day | ORAL | 0 refills | Status: DC

## 2020-06-21 NOTE — Progress Notes (Signed)
Virtual Visit via Video Note  I connected with Jennifer Welch on 06/21/20 at  3:00 PM EDT by a video enabled telemedicine application and verified that I am speaking with the correct person using two identifiers.  Location Provider Location : [facility] Patient Location : Home  Participants: Patient , Provider   I discussed the limitations of evaluation and management by telemedicine and the availability of in person appointments. The patient expressed understanding and agreed to proceed.   I discussed the assessment and treatment plan with the patient. The patient was provided an opportunity to ask questions and all were answered. The patient agreed with the plan and demonstrated an understanding of the instructions.   The patient was advised to call back or seek an in-person evaluation if the symptoms worsen or if the condition fails to improve as anticipated.  Kraemer MD OP Progress Note  06/21/2020 3:29 PM Ilea Hilton  MRN:  [id number]  Chief Complaint:  Chief Complaint    Follow-up; Depression; Anxiety     HPI: Jennifer Welch is a 60 year old African-American female, married, lives in [place], has a history of bipolar disorder, GAD, cocaine use disorder, gastroesophageal reflux disease, IBS, recurrent lipomas, carpal tunnel syndrome was evaluated by telemedicine today.  Patient today reports overall she is doing well with regards to her mood.  Does report mood swings here and there however she has been coping well.  She reports she is also coping with her anxiety symptoms better.  Patient reports sleep is overall okay.  She however tends to go to bed late at night however is able to sleep okay and feels rested when she wakes up.  She is taking the olanzapine which does help.  She however reports she does have side effects of increased appetite and possible weight gain from it.  She does have upcoming appointment with her primary care provider for evaluation for the same.  Patient denies  any suicidality, homicidality or perceptual disturbances.  Patient is compliant on her medications.  She continues to stay away from cocaine.  Denies any other abuse of illicit drugs.  Patient denies any other concerns today.   Visit Diagnosis:    ICD-10-CM   1. Bipolar disorder, in full remission, most recent episode mixed (HCC)  F31.78 OLANZapine (ZYPREXA) 10 MG tablet  2. GAD (generalized anxiety disorder)  F41.1 escitalopram (LEXAPRO) 10 MG tablet  3. Cocaine use disorder, moderate, in sustained remission (HCC)  F14.21 oxcarbazepine (TRILEPTAL) 600 MG tablet    Past Psychiatric History: I have reviewed past psychiatric history from my progress note on 04/06/2019.  Past trials of Seroquel, Trileptal, Prozac, Lexapro, Xanax, Klonopin, trazodone  Past Medical History:  Past Medical History:  Diagnosis Date  . Anxiety   . Bipolar 1 disorder ([place])   . Depression   . Hypertensive disorder 02/09/2019  . Neuromuscular disorder [facility])    CERVICAL DDD    Past Surgical History:  Procedure Laterality Date  . ABDOMINAL HYSTERECTOMY    . BONE EXCISION Right 12/15/2018   Procedure: PARTIAL EXCISION BONE-PHALANX RIGHT WITH FLUOROSCOPY;  Surgeon: Caroline More, DPM;  Location: [facility];  Service: Podiatry;  Laterality: Right;  MAC ANESTHESIA  . BREAST SURGERY    . ECTOPIC PREGNANCY SURGERY    . FOOT SURGERY    . HAND TENDON SURGERY    . KNEE SURGERY    . OVARIAN CYST REMOVAL      Family Psychiatric History: I have reviewed family psychiatric history from my progress note on  04/06/2019  Family History:  Family History  Problem Relation Age of Onset  . Diabetes Mother   . Alcohol abuse Father   . Colon cancer Father   . Alcohol abuse Brother   . Hypertension Brother     Social History: Reviewed social history from my progress note on 04/06/2019 Social History   Socioeconomic History  . Marital status: Married    Spouse name: [name]  . Number of children: 3  .  Years of education: Not on file  . Highest education level: Some college, no degree  Occupational History  . Not on file  Tobacco Use  . Smoking status: Former Smoker    Types: Cigarettes  . Smokeless tobacco: Never Used  Vaping Use  . Vaping Use: Never used  Substance and Sexual Activity  . Alcohol use: Not Currently  . Drug use: Never  . Sexual activity: Not Currently  Other Topics Concern  . Not on file  Social History Narrative  . Not on file   Social Determinants of Health   Financial Resource Strain: Not on file  Food Insecurity: Not on file  Transportation Needs: Not on file  Physical Activity: Not on file  Stress: Not on file  Social Connections: Not on file    Allergies:  Allergies  Allergen Reactions  . Phentermine Other (See Comments)    Vivid dreams/DRUG INTERACTION NOT ALLERGIC Vivid dreams/DRUG INTERACTION NOT ALLERGIC   . Quinolones Nausea Only and Other (See Comments)  . Macrobid [Nitrofurantoin Macrocrystal] Rash  . Septra [Sulfamethoxazole-Trimethoprim] Rash    Metabolic Disorder Labs: Lab Results  Component Value Date   HGBA1C 5.6 09/08/2019   MPG 114 09/08/2019   No results found for: PROLACTIN Lab Results  Component Value Date   CHOL 198 09/08/2019   TRIG 33 09/08/2019   HDL 88 09/08/2019   CHOLHDL 2.3 09/08/2019   VLDL 7 09/08/2019   LDLCALC 103 (H) 09/08/2019   Lab Results  Component Value Date   TSH 0.704 09/08/2019    Therapeutic Level Labs: No results found for: LITHIUM No results found for: VALPROATE No components found for:  CBMZ  Current Medications: Current Outpatient Medications  Medication Sig Dispense Refill  . OLANZapine (ZYPREXA) 10 MG tablet Take 1 tablet (10 mg total) by mouth at bedtime. Dose change 30 tablet 1  . ascorbic acid (VITAMIN C) 1000 MG tablet Take by mouth.    . Cholecalciferol 25 MCG (1000 UT) tablet Take 400 Units by mouth.     . diclofenac Sodium (VOLTAREN) 1 % GEL Apply topically.    Marland Kitchen  doxycycline (VIBRAMYCIN) 100 MG capsule Take 100 mg by mouth 2 (two) times daily. (Patient not taking: Reported on 11/18/2019)    . erythromycin ophthalmic ointment SMARTSIG:In Eye(s)    . escitalopram (LEXAPRO) 10 MG tablet Take 1 tablet (10 mg total) by mouth daily. 90 tablet 0  . fexofenadine (ALLEGRA) 180 MG tablet Take by mouth.    . gabapentin (NEURONTIN) 100 MG capsule Take 100 mg by mouth 3 (three) times daily.    . hydrOXYzine (ATARAX/VISTARIL) 25 MG tablet TAKE 1 TABLET BY MOUTH THREE TIMES DAILY 90 tablet 0  . ibuprofen (ADVIL) 200 MG tablet Take by mouth. (Patient not taking: Reported on 11/18/2019)    . ketorolac (TORADOL) 10 MG tablet Take 1 tablet (10 mg total) by mouth every 8 (eight) hours as needed for severe pain. (Patient not taking: Reported on 11/18/2019) 20 tablet 0  . meloxicam (MOBIC) 15 MG  tablet meloxicam 15 mg tablet (Patient not taking: Reported on 11/18/2019)    . naloxone (NARCAN) 4 MG/0.1ML LIQD nasal spray kit Narcan 4 mg/actuation nasal spray (Patient not taking: Reported on 11/18/2019)    . Omega-3 1000 MG CAPS Take by mouth.    . ondansetron (ZOFRAN) 4 MG tablet Take 1 tablet (4 mg total) by mouth every 8 (eight) hours as needed for nausea or vomiting. 20 tablet 0  . oxcarbazepine (TRILEPTAL) 600 MG tablet Take 1 tablet (600 mg total) by mouth daily. 90 tablet 0  . oxyCODONE-acetaminophen (PERCOCET) 7.5-325 MG tablet oxycodone-acetaminophen 7.5 mg-325 mg tablet  TAKE 1 TABLET BY MOUTH EVERY 6 HOURS AS NEEDED FOR UP TO 7 DAYS FOR SEVERE PAIN (Patient not taking: Reported on 11/18/2019)    . pantoprazole (PROTONIX) 40 MG tablet Take 40 mg by mouth daily.    . pregabalin (LYRICA) 100 MG capsule Take by mouth. (Patient not taking: Reported on 11/18/2019)    . pregabalin (LYRICA) 100 MG capsule Take 100 mg by mouth 3 (three) times daily.    . promethazine (PHENERGAN) 25 MG tablet Take by mouth.    Marland Kitchen tiZANidine (ZANAFLEX) 2 MG tablet tizanidine 2 mg tablet  TAKE 1 TABLET BY MOUTH  THREE TIMES DAILY    . traMADol (ULTRAM) 50 MG tablet tramadol 50 mg tablet  TAKE 1 TABLET BY MOUTH EVERY 6 HOURS AS NEEDED FOR PAIN (Patient not taking: Reported on 11/18/2019)     No current facility-administered medications for this visit.     Musculoskeletal: Strength & Muscle Tone: uta Gait & Station: UTA Patient leans: N/A  Psychiatric Specialty Exam: Review of Systems  Psychiatric/Behavioral: The patient is nervous/anxious.   All other systems reviewed and are negative.   There were no vitals taken for this visit.There is no height or weight on file to calculate BMI.  General Appearance: Casual  Eye Contact:  Fair  Speech:  Normal Rate  Volume:  Normal  Mood:  Anxious coping well  Affect:  Congruent  Thought Process:  Goal Directed and Descriptions of Associations: Intact  Orientation:  Full (Time, Place, and Person)  Thought Content: Logical   Suicidal Thoughts:  No  Homicidal Thoughts:  No  Memory:  Immediate;   Fair Recent;   Fair Remote;   Fair  Judgement:  Fair  Insight:  Fair  Psychomotor Activity:  Normal  Concentration:  Concentration: Fair and Attention Span: Fair  Recall:  [employer] of Knowledge: Fair  Language: Fair  Akathisia:  No  Handed:  Right  AIMS (if indicated): UTA  Assets:  Communication Skills Desire for Improvement Housing Social Support  ADL's:  Intact  Cognition: WNL  Sleep:  Fair   Screenings: PHQ2-9   Flowsheet Row Video Visit from 06/21/2020 in [facility]  PHQ-2 Total Score 1    Flowsheet Row Video Visit from 06/21/2020 in [facility] No Risk       Assessment and Plan: Tomesha Dolder is a 60 year old African-American female, married, on disability, lives in [place] with her husband, has a history of bipolar disorder, GAD, IBS, chronic back pain, multiple surgeries, cocaine use disorder in remission was evaluated by telemedicine today.  Patient  is currently stable on current medication regimen.  Plan as noted below.  Plan Bipolar disorder in full remission Reduce olanzapine to 10 mg p.o. nightly.  Long-term plan is to taper her off. Trileptal 600 mg p.o. daily Lexapro 10 mg  p.o. daily  GAD-stable Continue CBT Hydroxyzine 25 mg p.o. twice daily as needed  Cocaine use disorder in remission-Will monitor closely  Patient to follow-up in 3 weeks or sooner if needed.  She also has in person this is scheduled for May.  This note was generated in part or whole with voice recognition software. Voice recognition is usually quite accurate but there are transcription errors that can and very often do occur. I apologize for any typographical errors that were not detected and corrected.      Ursula Alert, MD 06/22/2020, 12:12 PM

## 2020-07-10 ENCOUNTER — Other Ambulatory Visit: Payer: Self-pay | Admitting: Infectious Diseases

## 2020-07-12 ENCOUNTER — Other Ambulatory Visit: Payer: Self-pay

## 2020-07-12 ENCOUNTER — Other Ambulatory Visit: Payer: Self-pay | Admitting: Allergy and Immunology

## 2020-07-12 ENCOUNTER — Encounter: Payer: Self-pay | Admitting: Psychiatry

## 2020-07-12 ENCOUNTER — Telehealth (INDEPENDENT_AMBULATORY_CARE_PROVIDER_SITE_OTHER): Payer: Medicare HMO | Admitting: Psychiatry

## 2020-07-12 ENCOUNTER — Other Ambulatory Visit: Payer: Self-pay | Admitting: Infectious Diseases

## 2020-07-12 DIAGNOSIS — Z1231 Encounter for screening mammogram for malignant neoplasm of breast: Secondary | ICD-10-CM

## 2020-07-12 DIAGNOSIS — G4700 Insomnia, unspecified: Secondary | ICD-10-CM | POA: Diagnosis not present

## 2020-07-12 DIAGNOSIS — F1421 Cocaine dependence, in remission: Secondary | ICD-10-CM

## 2020-07-12 DIAGNOSIS — F3178 Bipolar disorder, in full remission, most recent episode mixed: Secondary | ICD-10-CM

## 2020-07-12 DIAGNOSIS — F411 Generalized anxiety disorder: Secondary | ICD-10-CM | POA: Diagnosis not present

## 2020-07-12 MED ORDER — HYDROXYZINE HCL 25 MG PO TABS: ORAL_TABLET | ORAL | 0 refills | Status: DC

## 2020-07-12 NOTE — Progress Notes (Signed)
Virtual Visit via Video Note  I connected with Jennifer Welch on 07/12/20 at  2:00 PM EDT by a video enabled telemedicine application and verified that I am speaking with the correct person using two identifiers.  Location Provider Location : [facility] Patient Location : Home  Participants: Patient , Provider    I discussed the limitations of evaluation and management by telemedicine and the availability of in person appointments. The patient expressed understanding and agreed to proceed.   I discussed the assessment and treatment plan with the patient. The patient was provided an opportunity to ask questions and all were answered. The patient agreed with the plan and demonstrated an understanding of the instructions.   The patient was advised to call back or seek an in-person evaluation if the symptoms worsen or if the condition fails to improve as anticipated.   [facility] MD OP Progress Note  07/12/2020 3:57 PM Jennifer Welch  MRN:  [id number]  Chief Complaint:  Chief Complaint    Follow-up; Anxiety     HPI: Jennifer Welch is a 60 year old African-American female, married, lives in [place], has a history of bipolar disorder, GAD, cocaine use disorder, gastroesophageal reflux disease, IBS, carpal tunnel syndrome was evaluated by telemedicine today.  Patient today reports she had recent stressors.  She reports her husband took her car and that has been stressful for her.  She has has been feeling depressed about the situation.  She reports otherwise mood wise she has been feeling okay.  She reports she did not tolerate reducing the dosage of olanzapine.  She could not sleep for a few nights.  She has tried the higher dosage.  She reports the 15 mg has been more helpful.  She however reports she has been using a lot of caffeine and would like to make changes with her sleep hygiene.  She hence would rather stay on the 10 mg for now and work on sleep hygiene techniques.  Patient denies any  significant anxiety symptoms.  She reports work is going well.  Patient denies any suicidality, homicidality or perceptual disturbances.  Patient denies any other concerns today.  Visit Diagnosis:    ICD-10-CM   1. Bipolar disorder, in full remission, most recent episode mixed ([place])  F31.78   2. GAD (generalized anxiety disorder)  F41.1 hydrOXYzine (ATARAX/VISTARIL) 25 MG tablet  3. Insomnia, unspecified type  G47.00   4. Cocaine use disorder, moderate, in sustained remission ([place])  F14.21     Past Psychiatric History: I have reviewed past psychiatric history from my progress note on 04/06/2019.  Past trials of Seroquel, Trileptal, Prozac, Lexapro, Xanax, Klonopin, trazodone  Past Medical History:  Past Medical History:  Diagnosis Date  . Anxiety   . Bipolar 1 disorder ([place])   . Depression   . Hypertensive disorder 02/09/2019  . Neuromuscular disorder [facility])    CERVICAL DDD    Past Surgical History:  Procedure Laterality Date  . ABDOMINAL HYSTERECTOMY    . BONE EXCISION Right 12/15/2018   Procedure: PARTIAL EXCISION BONE-PHALANX RIGHT WITH FLUOROSCOPY;  Surgeon: Caroline More, DPM;  Location: [facility];  Service: Podiatry;  Laterality: Right;  MAC ANESTHESIA  . BREAST SURGERY    . ECTOPIC PREGNANCY SURGERY    . FOOT SURGERY    . HAND TENDON SURGERY    . KNEE SURGERY    . OVARIAN CYST REMOVAL      Family Psychiatric History: Reviewed family psychiatric history from progress note on 04/06/2019  Family History:  Family  History  Problem Relation Age of Onset  . Diabetes Mother   . Alcohol abuse Father   . Colon cancer Father   . Alcohol abuse Brother   . Hypertension Brother     Social History: Reviewed social history from progress note on 04/06/2019 Social History   Socioeconomic History  . Marital status: Married    Spouse name: darwin Age  . Number of children: 3  . Years of education: Not on file  . Highest education level: Some college, no degree   Occupational History  . Not on file  Tobacco Use  . Smoking status: Former Smoker    Types: Cigarettes  . Smokeless tobacco: Never Used  Vaping Use  . Vaping Use: Never used  Substance and Sexual Activity  . Alcohol use: Not Currently  . Drug use: Never  . Sexual activity: Not Currently  Other Topics Concern  . Not on file  Social History Narrative  . Not on file   Social Determinants of Health   Financial Resource Strain: Not on file  Food Insecurity: Not on file  Transportation Needs: Not on file  Physical Activity: Not on file  Stress: Not on file  Social Connections: Not on file    Allergies:  Allergies  Allergen Reactions  . Phentermine Other (See Comments)    Vivid dreams/DRUG INTERACTION NOT ALLERGIC Vivid dreams/DRUG INTERACTION NOT ALLERGIC   . Quinolones Nausea Only and Other (See Comments)  . Macrobid [Nitrofurantoin Macrocrystal] Rash  . Septra [Sulfamethoxazole-Trimethoprim] Rash    Metabolic Disorder Labs: Lab Results  Component Value Date   HGBA1C 5.6 09/08/2019   MPG 114 09/08/2019   No results found for: PROLACTIN Lab Results  Component Value Date   CHOL 198 09/08/2019   TRIG 33 09/08/2019   HDL 88 09/08/2019   CHOLHDL 2.3 09/08/2019   VLDL 7 09/08/2019   LDLCALC 103 (H) 09/08/2019   Lab Results  Component Value Date   TSH 0.704 09/08/2019    Therapeutic Level Labs: No results found for: LITHIUM No results found for: VALPROATE No components found for:  CBMZ  Current Medications: Current Outpatient Medications  Medication Sig Dispense Refill  . ascorbic acid (VITAMIN C) 1000 MG tablet Take by mouth.    . Cholecalciferol 25 MCG (1000 UT) tablet Take 400 Units by mouth.     . diclofenac Sodium (VOLTAREN) 1 % GEL Apply topically.    Marland Kitchen doxycycline (VIBRAMYCIN) 100 MG capsule Take 100 mg by mouth 2 (two) times daily. (Patient not taking: Reported on 11/18/2019)    . erythromycin ophthalmic ointment SMARTSIG:In Eye(s)    .  escitalopram (LEXAPRO) 10 MG tablet Take 1 tablet (10 mg total) by mouth daily. 90 tablet 0  . fexofenadine (ALLEGRA) 180 MG tablet Take by mouth.    . gabapentin (NEURONTIN) 100 MG capsule Take 100 mg by mouth 3 (three) times daily.    . hydrOXYzine (ATARAX/VISTARIL) 25 MG tablet TAKE 1 TABLET BY MOUTH THREE TIMES DAILY 90 tablet 0  . ibuprofen (ADVIL) 200 MG tablet Take by mouth. (Patient not taking: Reported on 11/18/2019)    . ketorolac (TORADOL) 10 MG tablet Take 1 tablet (10 mg total) by mouth every 8 (eight) hours as needed for severe pain. (Patient not taking: Reported on 11/18/2019) 20 tablet 0  . meloxicam (MOBIC) 15 MG tablet meloxicam 15 mg tablet (Patient not taking: Reported on 11/18/2019)    . naloxone (NARCAN) 4 MG/0.1ML LIQD nasal spray kit Narcan 4 mg/actuation nasal spray (Patient  not taking: Reported on 11/18/2019)    . OLANZapine (ZYPREXA) 10 MG tablet Take 1 tablet (10 mg total) by mouth at bedtime. Dose change 30 tablet 1  . Omega-3 1000 MG CAPS Take by mouth.    . ondansetron (ZOFRAN) 4 MG tablet Take 1 tablet (4 mg total) by mouth every 8 (eight) hours as needed for nausea or vomiting. 20 tablet 0  . oxcarbazepine (TRILEPTAL) 600 MG tablet Take 1 tablet (600 mg total) by mouth daily. 90 tablet 0  . oxyCODONE-acetaminophen (PERCOCET) 7.5-325 MG tablet oxycodone-acetaminophen 7.5 mg-325 mg tablet  TAKE 1 TABLET BY MOUTH EVERY 6 HOURS AS NEEDED FOR UP TO 7 DAYS FOR SEVERE PAIN (Patient not taking: Reported on 11/18/2019)    . pantoprazole (PROTONIX) 40 MG tablet Take 40 mg by mouth daily.    . pregabalin (LYRICA) 100 MG capsule Take by mouth. (Patient not taking: Reported on 11/18/2019)    . pregabalin (LYRICA) 100 MG capsule Take 100 mg by mouth 3 (three) times daily.    . promethazine (PHENERGAN) 25 MG tablet Take by mouth.    Marland Kitchen tiZANidine (ZANAFLEX) 2 MG tablet tizanidine 2 mg tablet  TAKE 1 TABLET BY MOUTH THREE TIMES DAILY    . traMADol (ULTRAM) 50 MG tablet tramadol 50 mg tablet   TAKE 1 TABLET BY MOUTH EVERY 6 HOURS AS NEEDED FOR PAIN (Patient not taking: Reported on 11/18/2019)     No current facility-administered medications for this visit.     Musculoskeletal: Strength & Muscle Tone: UTA Gait & Station: UTA Patient leans: N/A  Psychiatric Specialty Exam: Review of Systems  Psychiatric/Behavioral: Positive for sleep disturbance.  All other systems reviewed and are negative.   There were no vitals taken for this visit.There is no height or weight on file to calculate BMI.  General Appearance: Casual  Eye Contact:  Fair  Speech:  Normal Rate  Volume:  Normal  Mood:  Euthymic  Affect:  Congruent  Thought Process:  Goal Directed and Descriptions of Associations: Intact  Orientation:  Full (Time, Place, and Person)  Thought Content: Logical   Suicidal Thoughts:  No  Homicidal Thoughts:  No  Memory:  Immediate;   Fair Recent;   Fair Remote;   Fair  Judgement:  Fair  Insight:  Fair  Psychomotor Activity:  Normal  Concentration:  Concentration: Fair and Attention Span: Fair  Recall:  [employer] of Knowledge: Fair  Language: Fair  Akathisia:  No  Handed:  Right  AIMS (if indicated): UTA  Assets:  Communication Skills Desire for Improvement Housing Social Support  ADL's:  Intact  Cognition: WNL  Sleep:  Poor   Screenings: GAD-7   Flowsheet Row Video Visit from 07/12/2020 in [facility]  Total GAD-7 Score 7    PHQ2-9   Flowsheet Row Video Visit from 07/12/2020 in [facility] Video Visit from 06/21/2020 in [facility]  PHQ-2 Total Score 2 1  PHQ-9 Total Score 7 --    Flowsheet Row Video Visit from 07/12/2020 in [facility] Video Visit from 06/21/2020 in [facility] No Risk No Risk       Assessment and Plan: Jennifer Welch is a 60 year old African-American female, married, on  disability, lives in [place] with her husband, has a history of bipolar disorder, GAD, IBS, chronic back pain, multiple surgeries, cocaine use disorder in remission was evaluated by telemedicine today.  Patient does report sleep  problems.  She will benefit from the following plan.  Plan Bipolar disorder in full remission Continue olanzapine 10 mg p.o. nightly Trileptal 600 mg p.o. daily. Lexapro 10 mg p.o. daily  GAD-stable Continue CBT Hydroxyzine 25 mg p.o. twice daily as needed  Insomnia unspecified-unstable Discussed sleep hygiene techniques.  Patient reports she has been using a lot of caffeinated drinks and will benefit from cutting back. Discussed to have a set bedtime, wake-up time, keep the thermostat comfortable, where something comfortable to bed, Brick of TV computer devices with blue light couple of hours prior to bedtime. Avoid excessive fluids at the end of the day. Zyprexa will also help.  Cocaine use disorder in remission-Will monitor closely  Follow-up in clinic in 6 to 8 weeks or sooner if needed.  This note was generated in part or whole with voice recognition software. Voice recognition is usually quite accurate but there are transcription errors that can and very often do occur. I apologize for any typographical errors that were not detected and corrected.        Ursula Alert, MD 07/13/2020, 5:46 PM

## 2020-08-08 ENCOUNTER — Ambulatory Visit: Payer: Medicare HMO | Admitting: Psychiatry

## 2020-08-17 ENCOUNTER — Emergency Department: Payer: Medicare HMO

## 2020-08-17 ENCOUNTER — Other Ambulatory Visit: Payer: Self-pay

## 2020-08-17 ENCOUNTER — Emergency Department
Admission: EM | Admit: 2020-08-17 | Discharge: 2020-08-17 | Disposition: A | Discharge status: Home / Self Care | Payer: Medicare HMO | Attending: Emergency Medicine | Admitting: Emergency Medicine

## 2020-08-17 DIAGNOSIS — M7989 Other specified soft tissue disorders: Secondary | ICD-10-CM | POA: Diagnosis present

## 2020-08-17 DIAGNOSIS — M5412 Radiculopathy, cervical region: Secondary | ICD-10-CM

## 2020-08-17 DIAGNOSIS — Z79899 Other long term (current) drug therapy: Secondary | ICD-10-CM | POA: Insufficient documentation

## 2020-08-17 DIAGNOSIS — D1722 Benign lipomatous neoplasm of skin and subcutaneous tissue of left arm: Secondary | ICD-10-CM | POA: Diagnosis not present

## 2020-08-17 DIAGNOSIS — Z87891 Personal history of nicotine dependence: Secondary | ICD-10-CM | POA: Insufficient documentation

## 2020-08-17 DIAGNOSIS — I10 Essential (primary) hypertension: Secondary | ICD-10-CM | POA: Insufficient documentation

## 2020-08-17 MED ORDER — MELOXICAM 15 MG PO TABS: 15.0000 mg | ORAL_TABLET | Freq: Every day | ORAL | 0 refills | Status: DC

## 2020-08-17 NOTE — ED Provider Notes (Signed)
[facility] Emergency Department Provider Note  ____________________________________________  Time seen: Approximately 4:57 PM  I have reviewed the triage vital signs and the nursing notes.   HISTORY  Chief Complaint Numbness    HPI Jennifer Welch is a 60 y.o. female who presents the emergency department complaining of pain and swelling to the left arm.  Patient states that she has had some intermittent numbness and tingling to the left hand x2 weeks.  She has a history of bulging disks in the neck as well as a history of carpal tunnel that she had surgically managed in the past.  Patient states that as it came ago and initially she did not pay much attention to it.  Patient is also started to develop some swelling and a "knot" in the anterior part of her elbow.  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  No bleeding or clotting disorders.  Patient has a history of anxiety, bipolar disorder, hypertension, multiple bulging disc,  substance abuse.  No complaints of chronic medical issues.        Past Medical History:  Diagnosis Date  . Anxiety   . Bipolar 1 disorder ([place])   . Depression   . Hypertensive disorder 02/09/2019  . Neuromuscular disorder [facility])    CERVICAL DDD    Patient Active Problem List   Diagnosis Date Noted  . Bipolar 1 disorder, mixed, mild ([place]) 02/24/2020  . Bipolar disorder, in full remission, most recent episode mixed ([place]) 11/18/2019  . High risk medication use 09/06/2019  . Bipolar disorder, in partial remission, most recent episode mixed ([place]) 07/29/2019  . Cocaine use disorder, moderate, dependence ([place]) 06/29/2019  . Noncompliance with medications 06/02/2019  . Noncompliance with treatment plan 06/02/2019  . Benzodiazepine dependence ([place]) 05/18/2019  . Cocaine use disorder, moderate, in sustained remission ([place]) 04/06/2019  . Degeneration of spine 02/19/2019  . GAD (generalized  anxiety disorder) 02/09/2019  . Bipolar I disorder ([place]) 02/09/2019  . Hypertensive disorder 02/09/2019  . Abdominal pain, LLQ (left lower quadrant) 08/12/2018  . Bilateral hand numbness 08/06/2018  . Elevated blood pressure reading 07/16/2018  . Numbness 06/10/2018  . Irritable bowel syndrome with constipation and diarrhea 06/11/2017  . Seasonal allergies 05/29/2017  . Insomnia 05/28/2017  . Substance abuse ([place]) 11/21/2016  . Lung nodule < 6cm on CT 07/16/2016  . Chronic pain syndrome 05/13/2016  . Seasonal allergic rhinitis due to pollen 12/01/2014  . Trigger finger, right middle finger 06/21/2014  . Gastroesophageal reflux disease without esophagitis 05/03/2014  . DDD (degenerative disc disease), lumbar 12/22/2013  . Carpal tunnel syndrome of right wrist 11/23/2013  . Obesity 09/29/2013  . Left breast lump 04/22/2012  . Depression with anxiety 06/26/2011  . Multiple lipomas 06/26/2011    Past Surgical History:  Procedure Laterality Date  . ABDOMINAL HYSTERECTOMY    . BONE EXCISION Right 12/15/2018   Procedure: PARTIAL EXCISION BONE-PHALANX RIGHT WITH FLUOROSCOPY;  Surgeon: Caroline More, DPM;  Location: [place];  Service: Podiatry;  Laterality: Right;  MAC ANESTHESIA  . BREAST SURGERY    . ECTOPIC PREGNANCY SURGERY    . FOOT SURGERY    . HAND TENDON SURGERY    . KNEE SURGERY    . OVARIAN CYST REMOVAL      Prior to Admission medications   Medication Sig Start Date End Date Taking? Authorizing Provider  meloxicam (MOBIC) 15 MG tablet Take 1 tablet (15 mg total) by mouth daily.  08/17/20  Yes Mitsy Owen, Charline Bills, PA-C  ascorbic acid (VITAMIN C) 1000 MG tablet Take by mouth.    [provider]  Cholecalciferol 25 MCG (1000 UT) tablet Take 400 Units by mouth.     [provider]  diclofenac Sodium (VOLTAREN) 1 % GEL Apply topically. 05/03/14   [provider]  doxycycline (VIBRAMYCIN) 100 MG capsule Take 100 mg by mouth 2 (two) times  daily. Patient not taking: Reported on 11/18/2019 07/05/19   [provider]  erythromycin ophthalmic ointment SMARTSIG:In Eye(s) 11/05/19   [provider]  escitalopram (LEXAPRO) 10 MG tablet Take 1 tablet (10 mg total) by mouth daily. 06/21/20   Ursula Alert, MD  fexofenadine (ALLEGRA) 180 MG tablet Take by mouth.    [provider]  gabapentin (NEURONTIN) 100 MG capsule Take 100 mg by mouth 3 (three) times daily.    [provider]  hydrOXYzine (ATARAX/VISTARIL) 25 MG tablet TAKE 1 TABLET BY MOUTH THREE TIMES DAILY 07/12/20   Ursula Alert, MD  ibuprofen (ADVIL) 200 MG tablet Take by mouth. Patient not taking: Reported on 11/18/2019    [provider]  ketorolac (TORADOL) 10 MG tablet Take 1 tablet (10 mg total) by mouth every 8 (eight) hours as needed for severe pain. Patient not taking: Reported on 11/18/2019 03/29/19   Nance Pear, MD  naloxone [facility]) 4 MG/0.1ML LIQD nasal spray kit Narcan 4 mg/actuation nasal spray Patient not taking: Reported on 11/18/2019    [provider]  OLANZapine (ZYPREXA) 10 MG tablet Take 1 tablet (10 mg total) by mouth at bedtime. Dose change 06/21/20   Ursula Alert, MD  Omega-3 1000 MG CAPS Take by mouth.    [provider]  ondansetron (ZOFRAN) 4 MG tablet Take 1 tablet (4 mg total) by mouth every 8 (eight) hours as needed for nausea or vomiting. 03/29/19   Nance Pear, MD  oxcarbazepine (TRILEPTAL) 600 MG tablet Take 1 tablet (600 mg total) by mouth daily. 06/21/20   Ursula Alert, MD  oxyCODONE-acetaminophen (PERCOCET) 7.5-325 MG tablet oxycodone-acetaminophen 7.5 mg-325 mg tablet  TAKE 1 TABLET BY MOUTH EVERY 6 HOURS AS NEEDED FOR UP TO 7 DAYS FOR SEVERE PAIN Patient not taking: Reported on 11/18/2019    [provider]  pantoprazole (PROTONIX) 40 MG tablet Take 40 mg by mouth daily.    [provider]  pregabalin (LYRICA) 100 MG capsule Take by mouth. Patient not taking:  Reported on 11/18/2019 04/26/19   [provider]  pregabalin (LYRICA) 100 MG capsule Take 100 mg by mouth 3 (three) times daily. 04/26/19   [provider]  promethazine (PHENERGAN) 25 MG tablet Take by mouth. 12/22/19   [provider]  tiZANidine (ZANAFLEX) 2 MG tablet tizanidine 2 mg tablet  TAKE 1 TABLET BY MOUTH THREE TIMES DAILY 03/24/19   [provider]  traMADol (ULTRAM) 50 MG tablet tramadol 50 mg tablet  TAKE 1 TABLET BY MOUTH EVERY 6 HOURS AS NEEDED FOR PAIN Patient not taking: Reported on 11/18/2019 03/07/17   [provider]  omeprazole (PRILOSEC) 10 MG capsule Take 1 capsule (10 mg total) by mouth daily. 11/16/18 11/28/18  Kordel Leavy, Charline Bills, PA-C    Allergies Phentermine, Quinolones, Macrobid [nitrofurantoin macrocrystal], and Septra [sulfamethoxazole-trimethoprim]  Family History  Problem Relation Age of Onset  . Diabetes Mother   . Alcohol abuse Father   . Colon cancer Father   . Alcohol abuse Brother   . Hypertension Brother     Social History  Social History   Tobacco Use  . Smoking status: Former Smoker    Types: Cigarettes  . Smokeless tobacco: Never Used  Vaping Use  . Vaping Use: Never used  Substance Use Topics  . Alcohol use: Not Currently  . Drug use: Never     Review of Systems  Constitutional: No fever/chills Eyes: No visual changes. No discharge ENT: No upper respiratory complaints. Cardiovascular: no chest pain. Respiratory: no cough. No SOB. Gastrointestinal: No abdominal pain.  No nausea, no vomiting.  No diarrhea.  No constipation. Musculoskeletal: Positive for intermittent left arm numbness and tingling.  Positive for developing "knot" to the left anterior elbow Skin: Negative for rash, abrasions, lacerations, ecchymosis. Neurological: Negative for headaches, focal weakness or numbness.  10 System ROS otherwise negative.  ____________________________________________   PHYSICAL EXAM:  VITAL  SIGNS: ED Triage Vitals  Enc Vitals Group     BP 08/17/20 1540 (!) 155/81     Pulse Rate 08/17/20 1540 88     Resp 08/17/20 1540 20     Temp 08/17/20 1540 98.6 F (37 C)     Temp Source 08/17/20 1540 Oral     SpO2 08/17/20 1540 100 %     Weight 08/17/20 1548 215 lb (97.5 kg)     Height 08/17/20 1548 _0  (1.702 m)     Head Circumference --      Peak Flow --      Pain Score 08/17/20 1547 10     Pain Loc --      Pain Edu? --      Excl. in [place]? --      Constitutional: Alert and oriented. Well appearing and in no acute distress. Eyes: Conjunctivae are normal. PERRL. EOMI. Head: Atraumatic. ENT:      Ears:       Nose: No congestion/rhinnorhea.      Mouth/Throat: Mucous membranes are moist.  Neck: No stridor.    Cardiovascular: Normal rate, regular rhythm. Normal S1 and S2.  Good peripheral circulation. Respiratory: Normal respiratory effort without tachypnea or retractions. Lungs CTAB. Good air entry to the bases with no decreased or absent breath sounds. Musculoskeletal: Full range of motion to all extremities. No gross deformities appreciated.  Visualization of the left upper extremity reveals no gross erythema or edema.  No deformities.  Good range of motion to the shoulder, elbow and wrist joint.  Patient states that sensation appears to be decreased with palpation through the arm.  Only area of tenderness is along the AC fossa.  Patient has tenderness along the medial aspect with a palpable lesion.  This appears circumscribed, smooth.  It is mildly tender to palpation with no overlying skin changes.  No fluctuance or induration.  Again patient reports decrease sensation to the left upper extremity when compared with right but it is able to sense touch in all dermatomal distributions.  Radial pulse intact distally.  Sensation intact all digits. Neurologic:  Normal speech and language. No gross focal neurologic deficits are appreciated.  Skin:  Skin is warm, dry and intact. No rash  noted. Psychiatric: Mood and affect are normal. Speech and behavior are normal. Patient exhibits appropriate insight and judgement.   ____________________________________________   LABS (all labs ordered are listed, but only abnormal results are displayed)  Labs Reviewed - No data to display ____________________________________________  EKG   ____________________________________________  RADIOLOGY I personally viewed and evaluated these images as part of my medical decision making, as well as reviewing the written report  by the radiologist.  ED Provider Interpretation: No evidence of DVT  US Venous Img Upper Uni Left (DVT)  Result Date: 08/17/2020 CLINICAL DATA:  Arm swelling for 3 days EXAM: LEFT UPPER EXTREMITY VENOUS DOPPLER ULTRASOUND TECHNIQUE: Gray-scale sonography with graded compression, as well as color Doppler and duplex ultrasound were performed to evaluate the upper extremity deep venous system from the level of the subclavian vein and including the jugular, axillary, basilic, radial, ulnar and upper cephalic vein. Spectral Doppler was utilized to evaluate flow at rest and with distal augmentation maneuvers. COMPARISON:  None. FINDINGS: Contralateral Subclavian Vein: Respiratory phasicity is normal and symmetric with the symptomatic side. No evidence of thrombus. Normal compressibility. Internal Jugular Vein: No evidence of thrombus. Normal compressibility, respiratory phasicity and response to augmentation. Subclavian Vein: No evidence of thrombus. Normal compressibility, respiratory phasicity and response to augmentation. Axillary Vein: No evidence of thrombus. Normal compressibility, respiratory phasicity and response to augmentation. Cephalic Vein: No evidence of thrombus. Normal compressibility, respiratory phasicity and response to augmentation. Basilic Vein: No evidence of thrombus. Normal compressibility, respiratory phasicity and response to augmentation. Brachial Veins:  No evidence of thrombus. Normal compressibility, respiratory phasicity and response to augmentation. Radial Veins: No evidence of thrombus. Normal compressibility, respiratory phasicity and response to augmentation. Ulnar Veins: No evidence of thrombus. Normal compressibility, respiratory phasicity and response to augmentation. Venous Reflux:  None visualized. Other Findings:  None visualized. IMPRESSION: No evidence of DVT within the left upper extremity. Electronically Signed   By: Rolm Baptise M.D.   On: 08/17/2020 17:42    ____________________________________________    PROCEDURES  Procedure(s) performed:    Procedures    Medications - No data to display   ____________________________________________   INITIAL IMPRESSION / ASSESSMENT AND PLAN / ED COURSE  Pertinent labs & imaging results that were available during my care of the patient were reviewed by me and considered in my medical decision making (see chart for details).  Review of the [place] CSRS was performed in accordance of the [place] prior to dispensing any controlled drugs.           Patient's diagnosis is consistent with patient presents emergency department complaining of intermittent left arm numbness and tingling.  She has a history of multiple bulging disks in her neck and does have issues every so often with symptoms down her left arm.  She states that this has been a little bit more persistent than normal.  She denies any recent injuries.  Again symptoms are somewhat transient but she states that it felt like it was a little bit worse today than previously.  She is also concerned as she has an edematous area to the anterior elbow.  There is no erythema.  She states that it is sore if she pushes on it but it is not painful at baseline.  No evidence of DVT on ultrasound.  Suspect the symptoms in her elbow are lipoma.  She has a history of same, palpation is a well-circumscribed lesion that does appear to be a lipoma.  At  this time patient be placed on anti-inflammatory for symptom relief whether this is irritation around the lipoma affecting the nerves versus out of her neck.  Given concerning signs and symptoms to include increasing symptoms of numbness and tingling, skin changes overlying this lesion in her elbow, fevers or chills, or any symptoms such as chest pain or shortness of breath.  Otherwise follow-up with general surgery for excision of the lipoma.  Patient is given  ED precautions to return to the ED for any worsening or new symptoms.     ____________________________________________  FINAL CLINICAL IMPRESSION(S) / ED DIAGNOSES  Final diagnoses:  Arm swelling  Cervical radiculopathy  Lipoma of left upper extremity      NEW MEDICATIONS STARTED DURING THIS VISIT:  ED Discharge Orders         Ordered    meloxicam (MOBIC) 15 MG tablet  Daily        08/17/20 1836              This chart was dictated using voice recognition software/Dragon. Despite best efforts to proofread, errors can occur which can change the meaning. Any change was purely unintentional.    Darletta Moll, PA-C 08/17/20 1836    Vladimir Crofts, MD 08/17/20 [phone]

## 2020-08-17 NOTE — ED Triage Notes (Signed)
Pt states intermittent pins and needles feeling to left arm from finger tips to shoulder, pt says it comes and goes and has been doing that for several weeks, but more numb feeling in the last 3 days. Pt states feeling is not different in that arm. Pt with large golfball size lump on inner antecubital space  On left arm, pt notices lump on Tuesday. Area not red or sore.

## 2020-09-06 ENCOUNTER — Encounter: Payer: Self-pay | Admitting: Psychiatry

## 2020-09-06 ENCOUNTER — Other Ambulatory Visit: Payer: Self-pay

## 2020-09-06 ENCOUNTER — Ambulatory Visit (INDEPENDENT_AMBULATORY_CARE_PROVIDER_SITE_OTHER): Payer: Medicare HMO | Admitting: Psychiatry

## 2020-09-06 VITALS — BP 124/77 | HR 90 | Temp 97.1°F | Wt 203.0 lb

## 2020-09-06 DIAGNOSIS — G4701 Insomnia due to medical condition: Secondary | ICD-10-CM

## 2020-09-06 DIAGNOSIS — F411 Generalized anxiety disorder: Secondary | ICD-10-CM | POA: Diagnosis not present

## 2020-09-06 DIAGNOSIS — F3178 Bipolar disorder, in full remission, most recent episode mixed: Secondary | ICD-10-CM | POA: Diagnosis not present

## 2020-09-06 DIAGNOSIS — F1421 Cocaine dependence, in remission: Secondary | ICD-10-CM | POA: Diagnosis not present

## 2020-09-06 DIAGNOSIS — Z79899 Other long term (current) drug therapy: Secondary | ICD-10-CM

## 2020-09-06 MED ORDER — HYDROXYZINE HCL 25 MG PO TABS: ORAL_TABLET | ORAL | 2 refills | Status: DC

## 2020-09-06 MED ORDER — ESCITALOPRAM OXALATE 10 MG PO TABS: 10.0000 mg | ORAL_TABLET | Freq: Every day | ORAL | 0 refills | Status: DC

## 2020-09-06 MED ORDER — OLANZAPINE 10 MG PO TABS: 10.0000 mg | ORAL_TABLET | Freq: Every day | ORAL | 2 refills | Status: DC

## 2020-09-06 NOTE — Progress Notes (Signed)
[place] MD OP Progress Note  09/06/2020 3:37 PM Jennifer Welch  MRN:  [id number]  Chief Complaint:  Chief Complaint   Follow-up; Depression; Insomnia    HPI: Jennifer Welch is a 60 year old African-American female, married, lives in [place], has a history of bipolar disorder, GAD, cocaine use disorder in remission, gastroesophageal reflux disease, IBS, carpal tunnel syndrome, was evaluated in office today.  Patient today reports she is overall doing well with regards to her mood.  She was recently diagnosed with lipoma of her elbow of her left side.  She reports she does have numbness pain and tingling of her hand and hence she will need surgery to remove it.  She is currently on gabapentin and Lyrica.  She reports that helps with her pain.  She also has a history of back pain and the combination of Lyrica and gabapentin helps with the same.  She denies any sleep problems at this time.  She is sleeping through the night.  Patient denies any cocaine use at this time.  She continues to stay sober.  Patient denies any suicidality, homicidality or perceptual disturbances.  Patient denies any other concerns today.    Visit Diagnosis:    ICD-10-CM   1. Bipolar disorder, in full remission, most recent episode mixed (HCC)  F31.78 OLANZapine (ZYPREXA) 10 MG tablet    2. GAD (generalized anxiety disorder)  F41.1 escitalopram (LEXAPRO) 10 MG tablet    hydrOXYzine (ATARAX/VISTARIL) 25 MG tablet    3. Insomnia due to medical condition  G47.01    pain, mood    4. Cocaine use disorder, moderate, in sustained remission (HCC)  F14.21     5. High risk medication use  Z79.899       Past Psychiatric History: I have reviewed past psychiatric history from progress note on 04/06/2019.  Past trials of medications- Seroquel Trileptal Prozac Lexapro Xanax Klonopin Trazodone  Past Medical History:  Past Medical History:  Diagnosis Date   Anxiety    Bipolar 1 disorder ([place])    Depression     Hypertensive disorder 02/09/2019   Neuromuscular disorder [facility])    CERVICAL DDD    Past Surgical History:  Procedure Laterality Date   ABDOMINAL HYSTERECTOMY     BONE EXCISION Right 12/15/2018   Procedure: PARTIAL EXCISION BONE-PHALANX RIGHT WITH FLUOROSCOPY;  Surgeon: Caroline More, DPM;  Location: [facility];  Service: Podiatry;  Laterality: Right;  MAC ANESTHESIA   BREAST SURGERY     ECTOPIC PREGNANCY SURGERY     FOOT SURGERY     HAND TENDON SURGERY     KNEE SURGERY     OVARIAN CYST REMOVAL      Family Psychiatric History: I have reviewed family psychiatric history from progress note on 04/06/2019  Family History:  Family History  Problem Relation Age of Onset   Diabetes Mother    Alcohol abuse Father    Colon cancer Father    Alcohol abuse Brother    Hypertension Brother     Social History: Reviewed social history from progress note on 04/06/2019 Social History   Socioeconomic History   Marital status: Married    Spouse name: darwin Withers   Number of children: 3   Years of education: Not on file   Highest education level: Some college, no degree  Occupational History   Not on file  Tobacco Use   Smoking status: Former    Pack years: 0.00    Types: Cigarettes   Smokeless tobacco: Never  Media planner  Vaping Use: Never used  Substance and Sexual Activity   Alcohol use: Not Currently   Drug use: Never   Sexual activity: Not Currently  Other Topics Concern   Not on file  Social History Narrative   Not on file   Social Determinants of Health   Financial Resource Strain: Not on file  Food Insecurity: Not on file  Transportation Needs: Not on file  Physical Activity: Not on file  Stress: Not on file  Social Connections: Not on file    Allergies:  Allergies  Allergen Reactions   Phentermine Other (See Comments)    Vivid dreams/DRUG INTERACTION NOT ALLERGIC Vivid dreams/DRUG INTERACTION NOT ALLERGIC    Quinolones Nausea Only and Other (See  Comments)   Macrobid [Nitrofurantoin Macrocrystal] Rash   Septra [Sulfamethoxazole-Trimethoprim] Rash    Metabolic Disorder Labs: Lab Results  Component Value Date   HGBA1C 5.6 09/08/2019   MPG 114 09/08/2019   No results found for: PROLACTIN Lab Results  Component Value Date   CHOL 198 09/08/2019   TRIG 33 09/08/2019   HDL 88 09/08/2019   CHOLHDL 2.3 09/08/2019   VLDL 7 09/08/2019   LDLCALC 103 (H) 09/08/2019   Lab Results  Component Value Date   TSH 0.704 09/08/2019    Therapeutic Level Labs: No results found for: LITHIUM No results found for: VALPROATE No components found for:  CBMZ  Current Medications: Current Outpatient Medications  Medication Sig Dispense Refill   ascorbic acid (VITAMIN C) 1000 MG tablet Take by mouth.     Cholecalciferol 25 MCG (1000 UT) tablet Take 400 Units by mouth.      diclofenac Sodium (VOLTAREN) 1 % GEL Apply topically.     doxycycline (VIBRAMYCIN) 100 MG capsule Take 100 mg by mouth 2 (two) times daily.     erythromycin ophthalmic ointment SMARTSIG:In Eye(s)     fexofenadine (ALLEGRA) 180 MG tablet Take by mouth.     gabapentin (NEURONTIN) 100 MG capsule Take 100 mg by mouth 3 (three) times daily.     ibuprofen (ADVIL) 200 MG tablet Take by mouth.     ketorolac (TORADOL) 10 MG tablet Take 1 tablet (10 mg total) by mouth every 8 (eight) hours as needed for severe pain. 20 tablet 0   meloxicam (MOBIC) 15 MG tablet Take 1 tablet (15 mg total) by mouth daily. 30 tablet 0   naloxone (NARCAN) 4 MG/0.1ML LIQD nasal spray kit Narcan 4 mg/actuation nasal spray     Omega-3 1000 MG CAPS Take by mouth.     ondansetron (ZOFRAN) 4 MG tablet Take 1 tablet (4 mg total) by mouth every 8 (eight) hours as needed for nausea or vomiting. 20 tablet 0   oxcarbazepine (TRILEPTAL) 600 MG tablet Take 1 tablet (600 mg total) by mouth daily. 90 tablet 0   oxyCODONE-acetaminophen (PERCOCET) 7.5-325 MG tablet oxycodone-acetaminophen 7.5 mg-325 mg tablet  TAKE 1  TABLET BY MOUTH EVERY 6 HOURS AS NEEDED FOR UP TO 7 DAYS FOR SEVERE PAIN     pantoprazole (PROTONIX) 40 MG tablet Take 40 mg by mouth daily.     pregabalin (LYRICA) 100 MG capsule Take 1 capsule by mouth 3 (three) times daily.     promethazine (PHENERGAN) 25 MG tablet Take by mouth.     tiZANidine (ZANAFLEX) 2 MG tablet Take 1 tablet by mouth 3 (three) times daily.     escitalopram (LEXAPRO) 10 MG tablet Take 1 tablet (10 mg total) by mouth daily. 90 tablet 0   gabapentin (  NEURONTIN) 100 MG capsule Take 1 capsule by mouth 3 (three) times daily. (Patient not taking: Reported on 09/06/2020)     hydrOXYzine (ATARAX/VISTARIL) 25 MG tablet TAKE 1 TABLET BY MOUTH THREE TIMES DAILY 90 tablet 2   OLANZapine (ZYPREXA) 10 MG tablet Take 1 tablet (10 mg total) by mouth at bedtime. Dose change 30 tablet 2   pregabalin (LYRICA) 100 MG capsule Take by mouth. (Patient not taking: Reported on 09/06/2020)     pregabalin (LYRICA) 100 MG capsule Take 100 mg by mouth 3 (three) times daily. (Patient not taking: Reported on 09/06/2020)     tiZANidine (ZANAFLEX) 2 MG tablet tizanidine 2 mg tablet  TAKE 1 TABLET BY MOUTH THREE TIMES DAILY (Patient not taking: Reported on 09/06/2020)     No current facility-administered medications for this visit.     Musculoskeletal: Strength & Muscle Tone:  UTA Gait & Station: normal Patient leans: N/A  Psychiatric Specialty Exam: Review of Systems  Musculoskeletal:        Left sided elbow , pain and lump   Neurological:  Positive for numbness (left hand).  All other systems reviewed and are negative.  Blood pressure 124/77, pulse 90, temperature (!) 97.1 F (36.2 C), temperature source Temporal, weight 203 lb (92.1 kg).Body mass index is 31.79 kg/m.  General Appearance: Casual  Eye Contact:  Good  Speech:  Clear and Coherent  Volume:  Normal  Mood:  Euthymic  Affect:  Full Range  Thought Process:  Goal Directed and Descriptions of Associations: Intact  Orientation:   Full (Time, Place, and Person)  Thought Content: Logical   Suicidal Thoughts:  No  Homicidal Thoughts:  No  Memory:  Immediate;   Fair Recent;   Fair Remote;   Fair  Judgement:  Fair  Insight:  Fair  Psychomotor Activity:  Normal  Concentration:  Concentration: Fair and Attention Span: Fair  Recall:  [employer] of Knowledge: Fair  Language: Fair  Akathisia:  No  Handed:  Right  AIMS (if indicated): done  Assets:  Communication Skills Desire for Improvement Financial Resources/Insurance [place] Talents/Skills Transportation  ADL's:  Intact  Cognition: WNL  Sleep:  Good   Screenings: GAD-7    Flowsheet Row Video Visit from 07/12/2020 in [facility]  Total GAD-7 Score 7      PHQ2-9    Central Visit from 09/06/2020 in [facility] Video Visit from 07/12/2020 in [facility] Video Visit from 06/21/2020 in [facility]  PHQ-2 Total Score _0 PHQ-9 Total Score 3 7 --      [facility] Office Visit from 09/06/2020 in [facility] ED from 08/17/2020 in [facility] Video Visit from 07/12/2020 in [facility] No Risk No Risk No Risk        Assessment and Plan: Jennifer Welch is a 60 year old African-American female, married, on disability, lives in [place] with her husband, has a history of bipolar disorder, GAD, IBS, chronic back pain, multiple surgeries, cocaine use disorder in remission was evaluated in office today.  Patient is currently struggling with pain otherwise doing okay with regards to her mood.  Discussed plan as noted below.  Plan Bipolar disorder in remission Olanzapine 10 mg p.o. nightly.  Long-term plan is to taper it off. Trileptal 600 mg p.o. daily Lexapro 10 mg p.o.  daily  GAD-stable Continue CBT Hydroxyzine  25 mg p.o. twice daily as needed  Insomnia-improving Olanzapine as prescribed Continue sleep hygiene techniques  Cocaine use disorder in remission-Will monitor closely  High risk medication use-I have reviewed and discussed CBC with differential-dated 07/05/2020-platelet count-within normal limits, CMP-AST and ALT/within normal limits.  Patient to continue pain management.  She will continue to follow-up with her provider, has upcoming surgery scheduled for lipoma.  Follow-up in clinic in 2 to 3 months or sooner if needed.  This note was generated in part or whole with voice recognition software. Voice recognition is usually quite accurate but there are transcription errors that can and very often do occur. I apologize for any typographical errors that were not detected and corrected.          Ursula Alert, MD 09/07/2020, 5:32 PM

## 2020-09-11 ENCOUNTER — Ambulatory Visit: Payer: Medicare HMO | Admitting: Surgery

## 2020-09-11 ENCOUNTER — Other Ambulatory Visit: Payer: Self-pay | Admitting: Psychiatry

## 2020-09-11 DIAGNOSIS — F411 Generalized anxiety disorder: Secondary | ICD-10-CM

## 2020-09-12 ENCOUNTER — Other Ambulatory Visit: Payer: Self-pay | Admitting: General Surgery

## 2020-09-12 NOTE — Progress Notes (Signed)
Subjective:     Patient ID: Jennifer Welch is a 60 y.o. female.   HPI   The following portions of the patient's history were reviewed and updated as appropriate.   This a new patient is here today for: office visit. Here for evaluation of a large left elbow lipoma referred by Dr Ola Spurr.  She fond the knot about 3 weeks ago.  This was incidentally identified on physical exam when she had an episode of numbness involving the left arm prompting her to palpate throughout the arm for a source.  This has resolved.   She has had intermittent difficulty with various areas of numbness and tingling in the left upper extremity which she relates to her cervical spine disease.  (Prior injections, none recently).   She states her finger tips are numb, but have been numb for 2 years. She does have a bulging disc as well. She has had carpal tunnel surgery as well.   The patient describes having 2 cervical and 6 lumbar disks with spurs.   Previous bilateral carpal tunnel release.   Ultrasound was at [facility] by the emergency department when she presented with her initial onset of left upper extremity numbness..       Review of Systems  Constitutional: Negative for chills and fever.  Respiratory: Negative for cough.           Chief Complaint  Patient presents with   Lipoma      elbow      BP (!) 142/80   Pulse 88   Temp 36.4 C (97.6 F)   Ht 170.2 cm (5' 7")   Wt 91.6 kg (202 lb)   LMP  (LMP Unknown)   SpO2 95%   BMI 31.64 kg/m        Past Medical History:  Diagnosis Date   Affective bipolar disorder (CMS-HCC)     Anxiety     Arthritis      osteo   Depression     E-coli UTI     Ectopic pregnancy     GERD (gastroesophageal reflux disease)     Hiatal hernia     Hypertensive disorder 02/09/2019   Neuromuscular disease (CMS-HCC)     UTI (urinary tract infection)             Past Surgical History:  Procedure Laterality Date   BREAST SURGERY        lumpectomies   btl    1990   carpel tunnel Bilateral     COLONOSCOPY   2019   CYSTOURETHROSCOPY N/A 03/26/2018    Procedure: Cystoscopy;  Surgeon: Victorino Sparrow, MD;  Location: [facility];  Service: Urology;  Laterality: N/A;   CYSTOURETHROSCOPY W/CALIBRATION &/OR DILATION URETHRA N/A 03/26/2018    Procedure: Urethral Dilation;  Surgeon: Victorino Sparrow, MD;  Location: [facility];  Service: Urology;  Laterality: N/A;   DIAGNOSTIC LAPAROSCOPY        for endometriosis   ectoic pregnancy   1989   ENDOMETRIAL BIOPSY       foot surgery        x2   HYSTERECTOMY   1996   KNEE ARTHROSCOPY Bilateral     knee surgery Bilateral     LAPAROSCOPIC OVARIAN CYSTECTOMY   1992   teeth removed   1980                OB History     Gravida  4   Para  3   Term  Preterm      AB      Living         SAB      IAB      Ectopic      Molar      Multiple      Live Births           Obstetric Comments  Age at first period 89 Age of first pregnancy 43             Social History           Socioeconomic History   Marital status: Married      Spouse name: Insurance account manager   Number of children: 3   Years of education: 12+   Highest education level: Some college, no degree  Occupational History   Occupation: Pensions consultant      Comment: [employer]  Tobacco Use   Smoking status: Former Smoker      Packs/day: 0.50      Years: 5.00      Pack years: 2.50      Types: Cigarettes      Quit date: 1993      Years since quitting: 29.5   Smokeless tobacco: Never Used  Scientific laboratory technician Use: Never used  Substance and Sexual Activity   Alcohol use: Yes   Drug use: Never   Sexual activity: Yes      Partners: Male      Birth control/protection: None             Allergies  Allergen Reactions   Phentermine Other (See Comments)      Vivid dreams/DRUG INTERACTION NOT ALLERGIC   Quinolones Other (See Comments)   Ciprofloxacin Nausea   Macrodantin [Nitrofurantoin Macrocrystal] Rash   Septra  [Sulfamethoxazole-Trimethoprim] Angioedema      Current Medications        Current Outpatient Medications  Medication Sig Dispense Refill   escitalopram oxalate (LEXAPRO) 10 MG tablet Take 1 tablet (10 mg total) by mouth nightly 90 tablet 1   gabapentin (NEURONTIN) 100 MG capsule Take 1 capsule (100 mg total) by mouth 3 (three) times daily 270 capsule 3   hydrOXYzine (ATARAX) 25 MG tablet Take 25 mg by mouth 3 (three) times daily as needed       meloxicam (MOBIC) 15 MG tablet Take 1 tablet (15 mg total) by mouth once daily 30 tablet 0   naloxone (NARCAN) 4 mg/actuation nasal spray Place into both nostrils once as needed       OLANZapine (ZYPREXA) 5 MG tablet Take 5 mg by mouth nightly       ondansetron (ZOFRAN-ODT) 4 MG disintegrating tablet DISSOLVE 1 TABLET IN MOUTH EVERY 8 HOURS AS NEEDED       OXcarbazepine (TRILEPTAL) 600 MG tablet Take 1 tablet (600 mg total) by mouth once daily 90 tablet 1   pantoprazole (PROTONIX) 40 MG DR tablet Take 1 tablet (40 mg total) by mouth 2 (two) times daily 60 tablet 11   pregabalin (LYRICA) 100 MG capsule Take 1 capsule (100 mg total) by mouth 3 (three) times daily 270 capsule 1   promethazine (PHENERGAN) 25 MG tablet Take 1 tablet (25 mg total) by mouth every 6 (six) hours as needed for Nausea 30 tablet 3   tiZANidine (ZANAFLEX) 2 MG tablet TAKE 1 TABLET BY MOUTH THREE TIMES DAILY 90 tablet 0    No current facility-administered medications for this visit.  Family History  Problem Relation Age of Onset   Colon cancer Father     Alcohol abuse Father     Other Mother          Meningitis   Diabetes type II Mother     Alcohol abuse Brother     High blood pressure (Hypertension) Brother     No Known Problems Half-Sister     No Known Problems Half-Sister             Objective:   Physical Exam Exam conducted with a chaperone present.  Constitutional:      Appearance: Normal appearance.  Cardiovascular:     Rate and Rhythm: Normal  rate and regular rhythm.     Pulses: Normal pulses.          Radial pulses are 2+ on the right side and 2+ on the left side.     Heart sounds: Normal heart sounds.     Comments: No evidence of upper extremity edema.  No swelling of the hand.  Well-healed carpal tunnel incisions. Pulmonary:     Effort: Pulmonary effort is normal.     Breath sounds: Normal breath sounds.  Musculoskeletal:       Arms:     Cervical back: Neck supple.     Comments: Index lesion at the medial aspect of the antecubital fossa with reports of tingling into the proximal forearm with medial pressure.  No evidence of fascial fixation.  Superior and posterior to this is a previously excised mass consistent with lipoma less than a centimeter in diameter.  Superior and lateral to this dominant mass is a similar lipoma-like mass.  Skin:    General: Skin is warm and dry.  Neurological:     Mental Status: She is alert and oriented to person, place, and time.  Psychiatric:        Mood and Affect: Mood normal.        Behavior: Behavior normal.          Labs and Radiology:    Left upper extremity ultrasound of August 17, 2020:   This study was independently reviewed.  Tailored for evaluation of DVT.  None noted.     Ultrasound:   Ultrasound examination of the palpable mass in the antecubital fossa on the medial side was completed to assess for vascular involvement.  The area is medial and just superficial to the brachial artery.  The brachial veins are patent and unremarkable.  The hypoechoic mass with uniform echo pattern and no increased vascularity on duplex imaging measures 1.65 x 3.07 x 3.45 cm.  No clear neural structures are appreciated.   Laboratory review of July 05, 2020:   WBC [facility] Blood Cell Count) 4.1 - 10.2 10^3/uL 7.9   RBC (Red Blood Cell Count) 4.04 - 5.48 10^6/uL 4.74   Hemoglobin 12.0 - 15.0 gm/dL 13.6   Hematocrit 35.0 - 47.0 % 41.1   MCV (Mean Corpuscular Volume) 80.0 - 100.0 fl 86.7   MCH  (Mean Corpuscular Hemoglobin) 27.0 - 31.2 pg 28.7   MCHC (Mean Corpuscular Hemoglobin Concentration) 32.0 - 36.0 gm/dL 33.1   Platelet Count 150 - 450 10^3/uL 313   RDW-CV (Red Cell Distribution Width) 11.6 - 14.8 % 13.0   MPV (Mean Platelet Volume) 9.4 - 12.4 fl 9.2 Low    Neutrophils 1.50 - 7.80 10^3/uL 6.14   Lymphocytes 1.00 - 3.60 10^3/uL 1.34   Monocytes 0.00 - 1.50 10^3/uL 0.39   Eosinophils 0.00 - 0.55  10^3/uL 0.01   Basophils 0.00 - 0.09 10^3/uL 0.02   Neutrophil % 32.0 - 70.0 % 77.5 High    Lymphocyte % 10.0 - 50.0 % 16.9   Monocyte % 4.0 - 13.0 % 4.9   Eosinophil % 1.0 - 5.0 % 0.1 Low    Basophil% 0.0 - 2.0 % 0.3   Immature Granulocyte % <=0.7 % 0.3   Immature Granulocyte Count <=0.06 10^3/L 0.02     Glucose 70 - 110 mg/dL 110   Sodium 136 - 145 mmol/L 142   Potassium 3.6 - 5.1 mmol/L 3.7   Chloride 97 - 109 mmol/L 105   Carbon Dioxide (CO2) 22.0 - 32.0 mmol/L 27.0   Urea Nitrogen (BUN) 7 - 25 mg/dL 7   Creatinine 0.6 - 1.1 mg/dL 0.9   Glomerular Filtration Rate (eGFR), MDRD Estimate >60 mL/min/1.73sq m 77   Calcium 8.7 - 10.3 mg/dL 9.8   AST  8 - 39 U/L 14   ALT  5 - 38 U/L 10   Alk Phos (alkaline Phosphatase) 34 - 104 U/L 66   Albumin 3.5 - 4.8 g/dL 4.5   Bilirubin, Total 0.3 - 1.2 mg/dL 0.5   Protein, Total 6.1 - 7.9 g/dL 7.3   A/G Ratio 1.0 - 5.0 gm/dL 1.6            Assessment:     Multiple upper extremity lipomas.   Significant disc disease by clinical history, cervical and lumbar.    Plan:     The patient may benefit from neurosurgical evaluation (she has seen neurology in the past).   With the tenderness of the recently identified right medial elbow lesion, surgical excision is reasonable.  The potential for cutaneous nerve injury in this area with her reports of tingling was reviewed which might leave her numb in the proximal medial forearm.  With a recurrent lesion above and similar slightly tender lipoma on the upper arm, excision of all 3 sites  is appropriate.   Will anticipate a few day downtime, but otherwise rapid return to function.   With her sensitivity and the proximity to the brachial artery have recommended surgical excision under anesthesia.  This is acceptable to the patient.      This note is partially prepared by Karie Fetch, RN, acting as a scribe in the presence of Dr. Hervey Ard, MD.  The documentation recorded by the scribe accurately reflects the service I personally performed and the decisions made by me.    Robert Bellow, MD FACS

## 2020-09-20 ENCOUNTER — Encounter
Admission: RE | Admit: 2020-09-20 | Discharge: 2020-09-20 | Disposition: A | Discharge status: Home / Self Care | Payer: Medicare HMO | Source: Ambulatory Visit | Attending: General Surgery | Admitting: General Surgery

## 2020-09-20 ENCOUNTER — Other Ambulatory Visit: Payer: Self-pay

## 2020-09-20 NOTE — Patient Instructions (Addendum)
Your procedure is scheduled on: September 25, 2020 MONDAY Report to the Registration Desk on the 1st floor of the [facility]. To find out your arrival time, please call [phone] between 1PM - 3PM on: Friday September 22, 2020  REMEMBER: Instructions that are not followed completely may result in serious medical risk, up to and including death; or upon the discretion of your surgeon and anesthesiologist your surgery may need to be rescheduled.  Do not eat food after midnight the night before surgery.  No gum chewing, lozengers or hard candies.  You may however, drink CLEAR liquids up to 2 hours before you are scheduled to arrive for your surgery. Do not drink anything within 2 hours of your scheduled arrival time.  Clear liquids include: - water  - apple juice without pulp - gatorade (not RED, PURPLE, OR BLUE) - black coffee or tea (Do NOT add milk or creamers to the coffee or tea) Do NOT drink anything that is not on this list.   TAKE THESE MEDICATIONS THE MORNING OF SURGERY WITH A SIP OF WATER: LEXAPRO TIZANIDINE GABAPENTIN LYRICA PANTPRAZOLE (take one the night before and one on the morning of surgery - helps to prevent nausea after surgery.)  One week prior to surgery: Stop Anti-inflammatories (NSAIDS) such as Advil, Aleve, Ibuprofen, Motrin, Naproxen, Naprosyn and ASPIRIN OR Aspirin based products such as Excedrin, Goodys Powder, BC Powder STOP MELOXICAM Stop ANY OVER THE COUNTER supplements until after surgery. You may however, continue to take Tylenol if needed for pain up until the day of surgery.  No Alcohol for 24 hours before or after surgery.  No Smoking including e-cigarettes for 24 hours prior to surgery.  No chewable tobacco products for at least 6 hours prior to surgery.  No nicotine patches on the day of surgery.  Do not use any "recreational" drugs for at least a week prior to your surgery.  Please be advised that the combination of cocaine and anesthesia may  have negative outcomes, up to and including death. If you test positive for cocaine, your surgery will be cancelled.  On the morning of surgery brush your teeth with toothpaste and water, you may rinse your mouth with mouthwash if you wish. Do not swallow any toothpaste or mouthwash.  Do not wear jewelry, make-up, hairpins, clips or nail polish.  Do not wear lotions, powders, or perfumes OR DEODORANT    Do not shave body from the neck down 48 hours prior to surgery just in case you cut yourself which could leave a site for infection.  Also, freshly shaved skin may become irritated if using the CHG soap.  Contact lenses, hearing aids and dentures may not be worn into surgery.  Do not bring valuables to the hospital. [facility] is not responsible for any missing/lost belongings or valuables.   SHOWER DAY OF SURGERY   Notify your doctor if there is any change in your medical condition (cold, fever, infection).  Wear comfortable clothing (specific to your surgery type) to the hospital.  After surgery, you can help prevent lung complications by doing breathing exercises.  Take deep breaths and cough every 1-2 hours. Your doctor may order a device called an Incentive Spirometer to help you take deep breaths. When coughing or sneezing, hold a pillow firmly against your incision with both hands. This is called "splinting." Doing this helps protect your incision. It also decreases belly discomfort.  If you are being discharged the day of surgery, you will not be  allowed to drive home. You will need a responsible adult (18 years or older) to drive you home and stay with you that night.   If you are taking public transportation, you will need to have a responsible adult (18 years or older) with you. Please confirm with your physician that it is acceptable to use public transportation.   Please call the [facility] at [phone] if you have any questions about these  instructions.  Surgery Visitation Policy:  Patients undergoing a surgery or procedure may have one family member or support person with them as long as that person is not COVID-19 positive or experiencing its symptoms.  That person may remain in the waiting area during the procedure.  Inpatient Visitation:    Visiting hours are 7 a.m. to 8 p.m. Inpatients will be allowed two visitors daily. The visitors may change each day during the patient's stay. No visitors under the age of 62. Any visitor under the age of 62 must be accompanied by an adult. The visitor must pass COVID-19 screenings, use hand sanitizer when entering and exiting the patient's room and wear a mask at all times, including in the patient's room. Patients must also wear a mask when staff or their visitor are in the room. Masking is required regardless of vaccination status.

## 2020-09-25 ENCOUNTER — Other Ambulatory Visit: Payer: Self-pay

## 2020-09-25 ENCOUNTER — Ambulatory Visit: Payer: Medicare HMO | Admitting: Registered Nurse

## 2020-09-25 ENCOUNTER — Ambulatory Visit
Admission: RE | Admit: 2020-09-25 | Discharge: 2020-09-25 | Disposition: A | Discharge status: Home / Self Care | Payer: Medicare HMO | Attending: General Surgery | Admitting: General Surgery

## 2020-09-25 ENCOUNTER — Encounter
Admission: RE | Disposition: A | Discharge status: Home / Self Care | Payer: Self-pay | Source: Home / Self Care | Attending: General Surgery

## 2020-09-25 DIAGNOSIS — Z791 Long term (current) use of non-steroidal anti-inflammatories (NSAID): Secondary | ICD-10-CM | POA: Insufficient documentation

## 2020-09-25 DIAGNOSIS — Z87891 Personal history of nicotine dependence: Secondary | ICD-10-CM | POA: Diagnosis not present

## 2020-09-25 DIAGNOSIS — Z8759 Personal history of other complications of pregnancy, childbirth and the puerperium: Secondary | ICD-10-CM | POA: Insufficient documentation

## 2020-09-25 DIAGNOSIS — Z888 Allergy status to other drugs, medicaments and biological substances status: Secondary | ICD-10-CM | POA: Insufficient documentation

## 2020-09-25 DIAGNOSIS — Z881 Allergy status to other antibiotic agents status: Secondary | ICD-10-CM | POA: Insufficient documentation

## 2020-09-25 DIAGNOSIS — Z79899 Other long term (current) drug therapy: Secondary | ICD-10-CM | POA: Insufficient documentation

## 2020-09-25 DIAGNOSIS — Z882 Allergy status to sulfonamides status: Secondary | ICD-10-CM | POA: Insufficient documentation

## 2020-09-25 DIAGNOSIS — D1722 Benign lipomatous neoplasm of skin and subcutaneous tissue of left arm: Secondary | ICD-10-CM | POA: Insufficient documentation

## 2020-09-25 HISTORY — PX: LIPOMA EXCISION: SHX5283

## 2020-09-25 SURGERY — EXCISION LIPOMA
Anesthesia: General | Laterality: Left

## 2020-09-25 MED ORDER — ONDANSETRON HCL 4 MG/2ML IJ SOLN
INTRAMUSCULAR | Status: DC | PRN
  Administered 2020-09-25: 4 mg via INTRAVENOUS

## 2020-09-25 MED ORDER — ORAL CARE MOUTH RINSE: 15.0000 mL | Freq: Once | OROMUCOSAL | Status: AC

## 2020-09-25 MED ORDER — PHENYLEPHRINE HCL (PRESSORS) 10 MG/ML IV SOLN
INTRAVENOUS | Status: DC | PRN
  Administered 2020-09-25: 200 ug via INTRAVENOUS
  Administered 2020-09-25: 100 ug via INTRAVENOUS

## 2020-09-25 MED ORDER — MEPERIDINE HCL 25 MG/ML IJ SOLN: 6.2500 mg | INTRAMUSCULAR | Status: DC | PRN

## 2020-09-25 MED ORDER — GLYCOPYRROLATE 0.2 MG/ML IJ SOLN
INTRAMUSCULAR | Status: AC
  Filled 2020-09-25: qty 1

## 2020-09-25 MED ORDER — CHLORHEXIDINE GLUCONATE 0.12 % MT SOLN
OROMUCOSAL | Status: AC
  Filled 2020-09-25: qty 15

## 2020-09-25 MED ORDER — CHLORHEXIDINE GLUCONATE CLOTH 2 % EX PADS: 6.0000 | MEDICATED_PAD | Freq: Once | CUTANEOUS | Status: DC

## 2020-09-25 MED ORDER — DEXAMETHASONE SODIUM PHOSPHATE 10 MG/ML IJ SOLN
INTRAMUSCULAR | Status: AC
  Filled 2020-09-25: qty 1

## 2020-09-25 MED ORDER — BUPIVACAINE-EPINEPHRINE (PF) 0.5% -1:200000 IJ SOLN
INTRAMUSCULAR | Status: DC | PRN
  Administered 2020-09-25: 10 mL

## 2020-09-25 MED ORDER — PROPOFOL 10 MG/ML IV BOLUS
INTRAVENOUS | Status: DC | PRN
  Administered 2020-09-25: 150 mg via INTRAVENOUS
  Administered 2020-09-25: 50 mg via INTRAVENOUS

## 2020-09-25 MED ORDER — PROPOFOL 10 MG/ML IV BOLUS
INTRAVENOUS | Status: AC
  Filled 2020-09-25: qty 20

## 2020-09-25 MED ORDER — ACETAMINOPHEN 10 MG/ML IV SOLN
INTRAVENOUS | Status: AC
  Filled 2020-09-25: qty 100

## 2020-09-25 MED ORDER — 0.9 % SODIUM CHLORIDE (POUR BTL) OPTIME
TOPICAL | Status: DC | PRN
  Administered 2020-09-25: 100 mL

## 2020-09-25 MED ORDER — FENTANYL CITRATE (PF) 100 MCG/2ML IJ SOLN
INTRAMUSCULAR | Status: AC
  Administered 2020-09-25: 25 ug via INTRAVENOUS
  Filled 2020-09-25: qty 2

## 2020-09-25 MED ORDER — FENTANYL CITRATE (PF) 100 MCG/2ML IJ SOLN
INTRAMUSCULAR | Status: AC
  Filled 2020-09-25: qty 2

## 2020-09-25 MED ORDER — FENTANYL CITRATE (PF) 100 MCG/2ML IJ SOLN
25.0000 ug | INTRAMUSCULAR | Status: DC | PRN
  Administered 2020-09-25 (×3): 25 ug via INTRAVENOUS

## 2020-09-25 MED ORDER — BUPIVACAINE-EPINEPHRINE (PF) 0.5% -1:200000 IJ SOLN
INTRAMUSCULAR | Status: AC
  Filled 2020-09-25: qty 30

## 2020-09-25 MED ORDER — LACTATED RINGERS IV SOLN: INTRAVENOUS | Status: DC

## 2020-09-25 MED ORDER — FENTANYL CITRATE (PF) 100 MCG/2ML IJ SOLN
INTRAMUSCULAR | Status: DC | PRN
  Administered 2020-09-25: 25 ug via INTRAVENOUS

## 2020-09-25 MED ORDER — SUGAMMADEX SODIUM 200 MG/2ML IV SOLN
INTRAVENOUS | Status: DC | PRN
  Administered 2020-09-25: 200 mg via INTRAVENOUS

## 2020-09-25 MED ORDER — ACETAMINOPHEN 10 MG/ML IV SOLN
INTRAVENOUS | Status: DC | PRN
  Administered 2020-09-25: 1000 mg via INTRAVENOUS

## 2020-09-25 MED ORDER — LIDOCAINE HCL (CARDIAC) PF 100 MG/5ML IV SOSY
PREFILLED_SYRINGE | INTRAVENOUS | Status: DC | PRN
  Administered 2020-09-25: 100 mg via INTRAVENOUS

## 2020-09-25 MED ORDER — MIDAZOLAM HCL 2 MG/2ML IJ SOLN
INTRAMUSCULAR | Status: DC | PRN
  Administered 2020-09-25: 2 mg via INTRAVENOUS

## 2020-09-25 MED ORDER — GLYCOPYRROLATE 0.2 MG/ML IJ SOLN
INTRAMUSCULAR | Status: DC | PRN
  Administered 2020-09-25: .2 mg via INTRAVENOUS

## 2020-09-25 MED ORDER — ONDANSETRON HCL 4 MG/2ML IJ SOLN: 4.0000 mg | Freq: Once | INTRAMUSCULAR | Status: DC | PRN

## 2020-09-25 MED ORDER — DEXMEDETOMIDINE (PRECEDEX) IN NS 20 MCG/5ML (4 MCG/ML) IV SYRINGE
PREFILLED_SYRINGE | INTRAVENOUS | Status: AC
  Filled 2020-09-25: qty 5

## 2020-09-25 MED ORDER — MIDAZOLAM HCL 2 MG/2ML IJ SOLN
INTRAMUSCULAR | Status: AC
  Filled 2020-09-25: qty 2

## 2020-09-25 MED ORDER — DEXAMETHASONE SODIUM PHOSPHATE 10 MG/ML IJ SOLN
INTRAMUSCULAR | Status: DC | PRN
  Administered 2020-09-25: 10 mg via INTRAVENOUS

## 2020-09-25 MED ORDER — LIDOCAINE HCL (PF) 2 % IJ SOLN
INTRAMUSCULAR | Status: AC
  Filled 2020-09-25: qty 5

## 2020-09-25 MED ORDER — ONDANSETRON HCL 4 MG/2ML IJ SOLN
INTRAMUSCULAR | Status: AC
  Filled 2020-09-25: qty 2

## 2020-09-25 MED ORDER — CHLORHEXIDINE GLUCONATE 0.12 % MT SOLN
15.0000 mL | Freq: Once | OROMUCOSAL | Status: AC
  Administered 2020-09-25: 15 mL via OROMUCOSAL

## 2020-09-25 MED ORDER — HYDROCODONE-ACETAMINOPHEN 5-325 MG PO TABS: 1.0000 | ORAL_TABLET | ORAL | 0 refills | Status: DC | PRN

## 2020-09-25 MED ORDER — DEXMEDETOMIDINE (PRECEDEX) IN NS 20 MCG/5ML (4 MCG/ML) IV SYRINGE
PREFILLED_SYRINGE | INTRAVENOUS | Status: DC | PRN
  Administered 2020-09-25: 12 ug via INTRAVENOUS
  Administered 2020-09-25: 8 ug via INTRAVENOUS

## 2020-09-25 SURGICAL SUPPLY — 37 items
APL PRP STRL LF DISP 70% ISPRP (MISCELLANEOUS) ×1
BNDG COHESIVE 4X5 TAN STRL (GAUZE/BANDAGES/DRESSINGS) ×2 IMPLANT
BNDG GAUZE ELAST 4 BULKY (GAUZE/BANDAGES/DRESSINGS) ×2 IMPLANT
CANISTER SUCT 1200ML W/VALVE (MISCELLANEOUS) IMPLANT
CHLORAPREP W/TINT 26 (MISCELLANEOUS) ×2 IMPLANT
DRAPE 3/4 80X56 (DRAPES) ×2 IMPLANT
DRAPE LAPAROTOMY 100X77 ABD (DRAPES) ×2 IMPLANT
DRSG TEGADERM 4X4.75 (GAUZE/BANDAGES/DRESSINGS) ×2 IMPLANT
DRSG TELFA 4X3 1S NADH ST (GAUZE/BANDAGES/DRESSINGS) ×2 IMPLANT
ELECT REM PT RETURN 9FT ADLT (ELECTROSURGICAL) ×2
ELECTRODE REM PT RTRN 9FT ADLT (ELECTROSURGICAL) ×1 IMPLANT
GAUZE 4X4 16PLY ~~LOC~~+RFID DBL (SPONGE) ×2 IMPLANT
GAUZE SPONGE 4X4 12PLY STRL (GAUZE/BANDAGES/DRESSINGS) ×2 IMPLANT
GLOVE SURG ENC MOIS LTX SZ7.5 (GLOVE) ×2 IMPLANT
GLOVE SURG UNDER LTX SZ8 (GLOVE) ×2 IMPLANT
GOWN STRL REUS W/ TWL LRG LVL3 (GOWN DISPOSABLE) ×1 IMPLANT
GOWN STRL REUS W/ TWL XL LVL3 (GOWN DISPOSABLE) ×1 IMPLANT
GOWN STRL REUS W/TWL LRG LVL3 (GOWN DISPOSABLE) ×2
GOWN STRL REUS W/TWL XL LVL3 (GOWN DISPOSABLE) ×2
KIT TURNOVER KIT A (KITS) ×2 IMPLANT
LABEL OR SOLS (LABEL) ×2 IMPLANT
MANIFOLD NEPTUNE II (INSTRUMENTS) IMPLANT
NS IRRIG 500ML POUR BTL (IV SOLUTION) ×2 IMPLANT
PACK BASIN MINOR ARMC (MISCELLANEOUS) ×2 IMPLANT
PAD PREP 24X41 OB/GYN DISP (PERSONAL CARE ITEMS) ×2 IMPLANT
STOCKINETTE STRL 6IN 960660 (GAUZE/BANDAGES/DRESSINGS) ×2 IMPLANT
STRIP CLOSURE SKIN 1/2X4 (GAUZE/BANDAGES/DRESSINGS) ×2 IMPLANT
SUT ETHILON 3-0 (SUTURE) IMPLANT
SUT ETHILON 4-0 (SUTURE) ×2
SUT ETHILON 4-0 FS2 18XMFL BLK (SUTURE) ×1
SUT VIC AB 2-0 CT1 (SUTURE) ×2 IMPLANT
SUT VIC AB 3-0 SH 27 (SUTURE) ×2
SUT VIC AB 3-0 SH 27X BRD (SUTURE) ×1 IMPLANT
SUT VIC AB 4-0 FS2 27 (SUTURE) ×2 IMPLANT
SUT VICRYL+ 3-0 144IN (SUTURE) ×2 IMPLANT
SUTURE ETHLN 4-0 FS2 18XMF BLK (SUTURE) ×1 IMPLANT
SWABSTK COMLB BENZOIN TINCTURE (MISCELLANEOUS) ×2 IMPLANT

## 2020-09-25 NOTE — H&P (Signed)
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 For excision.   Medications Prior to Admission  Medication Sig Dispense Refill Last Dose   escitalopram (LEXAPRO) 10 MG tablet Take 1 tablet (10 mg total) by mouth daily. 90 tablet 0 09/24/2020   gabapentin (NEURONTIN) 100 MG capsule Take 100 mg by mouth 3 (three) times daily.   09/24/2020   hydrOXYzine (ATARAX/VISTARIL) 25 MG tablet TAKE 1 TABLET BY MOUTH THREE TIMES DAILY (Patient taking differently: Take 25 mg by mouth 3 (three) times daily as needed for anxiety.) 90 tablet 2 09/24/2020   meloxicam (MOBIC) 15 MG tablet Take 1 tablet (15 mg total) by mouth daily. 30 tablet 0 Past Week   naloxone (NARCAN) 4 MG/0.1ML LIQD nasal spray kit Place 1 spray into the nose once.      OLANZapine (ZYPREXA) 10 MG tablet Take 1 tablet (10 mg total) by mouth at bedtime. Dose change 30 tablet 2 09/24/2020   oxcarbazepine (TRILEPTAL) 600 MG tablet Take 1 tablet (600 mg total) by mouth daily. 90 tablet 0 09/24/2020   pantoprazole (PROTONIX) 40 MG tablet Take 40 mg by mouth 2 (two) times daily.   Past Week   pregabalin (LYRICA) 100 MG capsule Take 100 mg by mouth 3 (three) times daily.   Past Week   promethazine (PHENERGAN) 25 MG tablet Take 25 mg by mouth every 6 (six) hours as needed for nausea or vomiting.   Past Month   tiZANidine (ZANAFLEX) 2 MG tablet Take 2 mg by mouth 3 (three) times daily.   09/24/2020   ketorolac (TORADOL) 10 MG tablet Take 1 tablet (10 mg total) by mouth every 8 (eight) hours as needed for severe pain. (Patient not taking: Reported on 09/13/2020) 20 tablet 0 Not Taking   ondansetron (ZOFRAN) 4 MG tablet Take 1 tablet (4 mg total) by mouth every 8 (eight) hours as needed for nausea or vomiting. (Patient not taking: Reported on 09/13/2020) 20 tablet 0 Not Taking   Allergies  Allergen Reactions   Phentermine Other (See Comments)    Vivid dreams/DRUG INTERACTION NOT ALLERGIC Vivid dreams/DRUG  INTERACTION NOT ALLERGIC    Quinolones Nausea Only and Other (See Comments)   Macrobid [Nitrofurantoin Macrocrystal] Rash   Septra [Sulfamethoxazole-Trimethoprim] Rash   Past Medical History:  Diagnosis Date   Anxiety    Bipolar 1 disorder ([place])    Depression    Hypertensive disorder 02/09/2019   Neuromuscular disorder [facility])    CERVICAL DDD   Past Surgical History:  Procedure Laterality Date   ABDOMINAL HYSTERECTOMY     BONE EXCISION Right 12/15/2018   Procedure: PARTIAL EXCISION BONE-PHALANX RIGHT WITH FLUOROSCOPY;  Surgeon: Caroline More, DPM;  Location: [facility];  Service: Podiatry;  Laterality: Right;  MAC ANESTHESIA   BREAST SURGERY     ECTOPIC PREGNANCY SURGERY     FOOT SURGERY     HAND TENDON SURGERY     KNEE SURGERY     OVARIAN CYST REMOVAL     Social History   Socioeconomic History   Marital status: Married    Spouse name: darwin Niu   Number of children: 3   Years of education: Not on file   Highest education level: Some college, no degree  Occupational History   Not on file  Tobacco Use   Smoking status: Former    Pack years: 0.00    Types: Cigarettes   Smokeless tobacco: Never  Vaping Use   Vaping  Use: Never used  Substance and Sexual Activity   Alcohol use: Yes    Comment: OCCAS   Drug use: Never   Sexual activity: Not Currently  Other Topics Concern   Not on file  Social History Narrative   Not on file   Social Determinants of Health   Financial Resource Strain: Not on file  Food Insecurity: Not on file  Transportation Needs: Not on file  Physical Activity: Not on file  Stress: Not on file  Social Connections: Not on file  Intimate Partner Violence: Not on file   Social History   Social History Narrative   Not on file     ROS: Negative.     PE: HEENT: Negative. Lungs: Clear. Cardio: RR.   Assessment/Plan:  Proceed with planned excision multiple lipomas centered on the left ante-cubital fossa.  Forest Gleason  [facility] 09/25/2020

## 2020-09-25 NOTE — Transfer of Care (Signed)
Immediate Anesthesia Transfer of Care Note  Patient: Jennifer Welch  Procedure(s) Performed: EXCISION LIPOMAS (Left)  Patient Location: PACU  Anesthesia Type:General  Level of Consciousness: drowsy  Airway & Oxygen Therapy: Patient Spontanous Breathing and Patient connected to face mask oxygen  Post-op Assessment: Report given to RN and Post -op Vital signs reviewed and stable  Post vital signs: Reviewed and stable  Last Vitals:  Vitals Value Taken Time  BP 149/74 09/25/20 1327  Temp    Pulse 84 09/25/20 1329  Resp 17 09/25/20 1329  SpO2 100 % 09/25/20 1329  Vitals shown include unvalidated device data.  Last Pain:  Vitals:   09/25/20 1037  TempSrc: Temporal  PainSc: 0-No pain         Complications: No notable events documented.

## 2020-09-25 NOTE — Anesthesia Preprocedure Evaluation (Signed)
Anesthesia Evaluation  Patient identified by MRN, date of birth, ID band Patient awake    Reviewed: Allergy & Precautions, NPO status , Patient's Chart, lab work & pertinent test results  History of Anesthesia Complications Negative for: history of anesthetic complications  Airway Mallampati: II   Neck ROM: Full    Dental  (+) Edentulous Upper, Edentulous Lower   Pulmonary neg pulmonary ROS, former smoker,    Pulmonary exam normal breath sounds clear to auscultation       Cardiovascular Exercise Tolerance: Good hypertension, negative cardio ROS Normal cardiovascular exam Rhythm:Regular Rate:Normal     Neuro/Psych PSYCHIATRIC DISORDERS Anxiety Depression Bipolar Disorder  Neuromuscular disease    GI/Hepatic GERD  ,  Endo/Other  negative endocrine ROS  Renal/GU negative Renal ROS     Musculoskeletal  (+) Arthritis ,   Abdominal   Peds  Hematology negative hematology ROS (+)   Anesthesia Other Findings   Reproductive/Obstetrics                             Anesthesia Physical  Anesthesia Plan  ASA: 2  Anesthesia Plan: General   Post-op Pain Management:    Induction: Intravenous  PONV Risk Score and Plan: 3 and Propofol infusion and TIVA  Airway Management Planned: LMA  Additional Equipment:   Intra-op Plan:   Post-operative Plan: Extubation in OR  Informed Consent: I have reviewed the patients History and Physical, chart, labs and discussed the procedure including the risks, benefits and alternatives for the proposed anesthesia with the patient or authorized representative who has indicated his/her understanding and acceptance.       Plan Discussed with: CRNA, Anesthesiologist and Surgeon  Anesthesia Plan Comments:         Anesthesia Quick Evaluation

## 2020-09-25 NOTE — Discharge Instructions (Signed)

## 2020-09-25 NOTE — Op Note (Signed)
Preoperative leg gnosis: Multiple lipomas left arm, symptomatic.  Postoperative diagnosis: Same.  Operative procedure: Excision of left arm lipomas with ultrasound guidance.  Operating Surgeon: Hervey Ard, MD.  Anesthesia: General endotracheal, Marcaine 0.5% with 1: 200,000 units of epinephrine, 10 cc.  Estimated blood loss: Less than 1 cc.  Clinical note this patient has developed 2, 1 cm or less lipomas on the left upper arm, medial and lateral aspect, distal to been symptomatic with direct pressure.  She has a more dominant 3 cm mass in the antecubital fossa on the medial aspect.  Office ultrasound showed this abutted the brachial artery.  She is felt to be a candidate for operative resection under anesthesia.  Operative note: The arm was cleansed with ChloraPrep and a stockinette placed.  Gauze followed by laparotomy drape were placed.  The 2 smaller lesions were excised to transverse incisions after local anesthesia was infiltrated.  Each of these were expressed without difficulty.  No bleeding.  The larger lesion in the medial aspect of the left antecubital fossa was also injected with local anesthesia away from the incision site.  A small 3-4 cm transverse incision was made and the adipose tissue exposed.  Below this was the muscle layer and ultrasound was used to confirm the location within the muscle.  The muscle fibers were split in the direction of their fibers and the typical yellow lobular fat of the lipoma was identified.  This was markedly different from the 2 superior lesions and it was very friable and fractured easily.  With much ado the vast majority of the lipoma-like material was removed although there is likely tiny fragments behind.  The brachial vessels evaluated with ultrasound confirmed they would be protected were subsequently exposed.  No bleeding was noted.  The muscle fascia was closed with interrupted 3-0 Vicryl figure-of-eight sutures.  The adipose layer was  approximated in similar fashion.  The skin was closed with a running 4-0 Vicryl subcuticular suture for the major incision and simple subcuticular sutures for the smaller incisions.  Benzoin and Steri-Strips followed by Telfa, Tegaderm, compressive wrap and a Coban dressing were applied.  Patient tolerated procedure well and was taken recovery in stable condition.

## 2020-09-25 NOTE — Anesthesia Procedure Notes (Addendum)
Procedure Name: Intubation Date/Time: 09/25/2020 12:21 PM Performed by: Doreen Salvage, CRNA Pre-anesthesia Checklist: Patient identified, Patient being monitored, Timeout performed, Emergency Drugs available and Suction available Patient Re-evaluated:Patient Re-evaluated prior to induction Oxygen Delivery Method: Circle System Utilized Preoxygenation: Pre-oxygenation with 100% oxygen Induction Type: IV induction Ventilation: Mask ventilation without difficulty LMA: LMA inserted LMA Size: 4.0 and 4.5 Laryngoscope Size: Mac, 3 and McGraph Grade View: Grade II Tube type: Oral Tube size: 7.0 mm Number of attempts: 3 Airway Equipment and Method: Stylet Placement Confirmation: ETT inserted through vocal cords under direct vision, positive ETCO2 and breath sounds checked- equal and bilateral Secured at: 21 cm Tube secured with: Tape Dental Injury: Teeth and Oropharynx as per pre-operative assessment  Comments: Unable to obtain adequate TVs with Ambu and Air-Q LMAs. Patient intubated with ease

## 2020-09-25 NOTE — Anesthesia Postprocedure Evaluation (Signed)
Anesthesia Post Note  Patient: Jennifer Welch  Procedure(s) Performed: EXCISION LIPOMAS (Left)  Patient location during evaluation: PACU Anesthesia Type: General Level of consciousness: awake and alert, awake and oriented Pain management: pain level controlled Vital Signs Assessment: post-procedure vital signs reviewed and stable Respiratory status: spontaneous breathing, nonlabored ventilation and respiratory function stable Cardiovascular status: blood pressure returned to baseline and stable Postop Assessment: no apparent nausea or vomiting Anesthetic complications: no   No notable events documented.   Last Vitals:  Vitals:   09/25/20 1415 09/25/20 1422  BP: (!) 149/80   Pulse: 77 73  Resp: 16 (!) 21  Temp:    SpO2: 96% 97%    Last Pain:  Vitals:   09/25/20 1422  TempSrc:   PainSc: 5                  Phill Mutter

## 2020-09-26 ENCOUNTER — Encounter: Payer: Self-pay | Admitting: General Surgery

## 2020-09-26 LAB — SURGICAL PATHOLOGY

## 2020-10-09 ENCOUNTER — Telehealth: Payer: Self-pay

## 2020-10-09 DIAGNOSIS — F1421 Cocaine dependence, in remission: Secondary | ICD-10-CM

## 2020-10-09 DIAGNOSIS — F3178 Bipolar disorder, in full remission, most recent episode mixed: Secondary | ICD-10-CM

## 2020-10-09 MED ORDER — OLANZAPINE 10 MG PO TABS: 10.0000 mg | ORAL_TABLET | Freq: Every day | ORAL | 2 refills | Status: DC

## 2020-10-09 MED ORDER — OXCARBAZEPINE 600 MG PO TABS: 600.0000 mg | ORAL_TABLET | Freq: Every day | ORAL | 0 refills | Status: DC

## 2020-10-09 NOTE — Telephone Encounter (Signed)
pt called states she needs enough medication to get to her trip on [place] 10[place]21[place]20. pharmacy states you have to call them to refill early

## 2020-10-09 NOTE — Telephone Encounter (Signed)
I have sent a prescription for olanzapine.  I have also refilled her Trileptal.  She is not due for Lexapro yet.

## 2020-10-11 ENCOUNTER — Emergency Department
Admission: EM | Admit: 2020-10-11 | Discharge: 2020-10-12 | Disposition: A | Discharge status: Home / Self Care | Payer: Medicare HMO | Attending: Emergency Medicine | Admitting: Emergency Medicine

## 2020-10-11 ENCOUNTER — Other Ambulatory Visit: Payer: Self-pay

## 2020-10-11 ENCOUNTER — Telehealth: Payer: Self-pay

## 2020-10-11 DIAGNOSIS — Z79899 Other long term (current) drug therapy: Secondary | ICD-10-CM | POA: Insufficient documentation

## 2020-10-11 DIAGNOSIS — R569 Unspecified convulsions: Secondary | ICD-10-CM | POA: Insufficient documentation

## 2020-10-11 DIAGNOSIS — Z87891 Personal history of nicotine dependence: Secondary | ICD-10-CM | POA: Insufficient documentation

## 2020-10-11 LAB — BASIC METABOLIC PANEL
Anion gap: 10 (ref 5–15)
BUN: 7 mg/dL (ref 6–20)
CO2: 25 mmol/L (ref 22–32)
Calcium: 9.6 mg/dL (ref 8.9–10.3)
Chloride: 106 mmol/L (ref 98–111)
Creatinine, Ser: 0.79 mg/dL (ref 0.44–1.00)
GFR, Estimated: >60 mL/min (ref >60)
Glucose, Bld: 122 mg/dL — ABNORMAL HIGH (ref 70–99)
Potassium: 3.5 mmol/L (ref 3.5–5.1)
Sodium: 141 mmol/L (ref 135–145)

## 2020-10-11 LAB — CBC
HCT: 42.6 % (ref 36.0–46.0)
Hemoglobin: 14.2 g/dL (ref 12.0–15.0)
MCH: 29.1 pg (ref 26.0–34.0)
MCHC: 33.3 g/dL (ref 30.0–36.0)
MCV: 87.3 fL (ref 80.0–100.0)
Platelets: 327 10*3/uL (ref 150–400)
RBC: 4.88 MIL/uL (ref 3.87–5.11)
RDW: 13.5 % (ref 11.5–15.5)
WBC: 6.9 10*3/uL (ref 4.0–10.5)
nRBC: 0 % (ref 0.0–0.2)

## 2020-10-11 NOTE — ED Triage Notes (Signed)
Pt comes pov after seizure witnessed by husband. He states that it lasted 1-2 mins with full body shaking and unable to get her out of it. Pt states this is the second one since her arm surgery a few weeks ago. Pt has not been seen by anyone for these.

## 2020-10-11 NOTE — ED Notes (Addendum)
Patient's spouse at the bedside. No seizure activity observed.

## 2020-10-11 NOTE — ED Provider Notes (Signed)
[facility] Emergency Department Provider Note  ____________________________________________   Event Date/Time   First MD Initiated Contact with Patient 10/11/20 2256     (approximate)  I have reviewed the triage vital signs and the nursing notes.   HISTORY  Chief Complaint Seizures    HPI Jennifer Welch is a 60 y.o. female with medical and psychiatric history as listed below who presents for seizure-like activity witnessed by her husband.  This is happened twice over the last couple of weeks, starting most recently immediately after surgery on her left arm to remove a lipoma.  It happened again earlier today.  She is on a number of medications including Lyrica, Trileptal, Zyprexa, and Lexapro.  She said that she has never been diagnosed with seizures before even though the first 2 medications listed in particular do have antiepileptic activity.  She has seen Dr. Melrose Nakayama in the past but apparently this was for nerve related issues rather than seizures.  She reports that she decided she needed to come off of all of her medications so she stopped them "cold [place]".  She then spoke with one of her doctors who told her that this was a bad idea so she started taking some of them again but she says that she has not been taking her Trileptal or her Lyrica, particularly the Lyrica, at least not consistently.  She is not sure when she takes it when she does not.  Her husband reports that the patients episodes were similar between the 2 times, they last a couple minutes and involved her shaking all over and making some guttural sounds and being confused afterwards.  He reports that after one of the episodes she was not moving her left arm very well when she was still confused and out of it but then she was back to normal immediately afterwards.  She did not fall nor injure herself.  She is completely back to baseline right now and has no pain.  At no point has she had a fever  or shortness of breath, no neck pain or stiffness.  Nothing in particular made the symptoms better or worse, they were acute in onset, and severe.  She denies drug use.     Past Medical History:  Diagnosis Date   Anxiety    Bipolar 1 disorder ([place])    Depression    Hypertensive disorder 02/09/2019   Neuromuscular disorder [facility])    CERVICAL DDD    Patient Active Problem List   Diagnosis Date Noted   Bipolar 1 disorder, mixed, mild ([place]) 02/24/2020   Bipolar disorder, in full remission, most recent episode mixed ([place]) 11/18/2019   High risk medication use 09/06/2019   Bipolar disorder, in partial remission, most recent episode mixed ([place]) 07/29/2019   Cocaine use disorder, moderate, dependence ([place]) 06/29/2019   Noncompliance with medications 06/02/2019   Noncompliance with treatment plan 06/02/2019   Benzodiazepine dependence ([place]) 05/18/2019   Cocaine use disorder, moderate, in sustained remission ([place]) 04/06/2019   Degeneration of spine 02/19/2019   GAD (generalized anxiety disorder) 02/09/2019   Bipolar I disorder ([place]) 02/09/2019   Hypertensive disorder 02/09/2019   Abdominal pain, LLQ (left lower quadrant) 08/12/2018   Bilateral hand numbness 08/06/2018   Elevated blood pressure reading 07/16/2018   Numbness 06/10/2018   Irritable bowel syndrome with constipation and diarrhea 06/11/2017   Seasonal allergies 05/29/2017   Insomnia 05/28/2017   Substance abuse ([place]) 11/21/2016   Lung nodule < 6cm on CT 07/16/2016  Chronic pain syndrome 05/13/2016   Seasonal allergic rhinitis due to pollen 12/01/2014   Trigger finger, right middle finger 06/21/2014   Gastroesophageal reflux disease without esophagitis 05/03/2014   DDD (degenerative disc disease), lumbar 12/22/2013   Carpal tunnel syndrome of right wrist 11/23/2013   Obesity 09/29/2013   Left breast lump 04/22/2012   Depression with anxiety 06/26/2011   Multiple lipomas 06/26/2011    Past Surgical History:   Procedure Laterality Date   ABDOMINAL HYSTERECTOMY     BONE EXCISION Right 12/15/2018   Procedure: PARTIAL EXCISION BONE-PHALANX RIGHT WITH FLUOROSCOPY;  Surgeon: Caroline More, DPM;  Location: [facility];  Service: Podiatry;  Laterality: Right;  MAC ANESTHESIA   BREAST SURGERY     ECTOPIC PREGNANCY SURGERY     FOOT SURGERY     HAND TENDON SURGERY     KNEE SURGERY     LIPOMA EXCISION Left 09/25/2020   Procedure: EXCISION LIPOMAS;  Surgeon: Robert Bellow, MD;  Location: [facility];  Service: General;  Laterality: Left;  arm   OVARIAN CYST REMOVAL      Prior to Admission medications   Medication Sig Start Date End Date Taking? Authorizing Provider  escitalopram (LEXAPRO) 10 MG tablet Take 1 tablet (10 mg total) by mouth daily. 09/06/20   Ursula Alert, MD  gabapentin (NEURONTIN) 100 MG capsule Take 100 mg by mouth 3 (three) times daily.    [provider]  HYDROcodone-acetaminophen (NORCO/VICODIN) 5-325 MG tablet Take 1 tablet by mouth every 4 (four) hours as needed for moderate pain. 09/25/20 09/25/21  Robert Bellow, MD  meloxicam (MOBIC) 15 MG tablet Take 1 tablet (15 mg total) by mouth daily. 08/17/20   Cuthriell, Charline Bills, PA-C  naloxone (NARCAN) 4 MG/0.1ML LIQD nasal spray kit Place 1 spray into the nose once.    [provider]  OLANZapine (ZYPREXA) 10 MG tablet Take 1 tablet (10 mg total) by mouth at bedtime. 10/09/20   Ursula Alert, MD  oxcarbazepine (TRILEPTAL) 600 MG tablet Take 1 tablet (600 mg total) by mouth daily. 10/09/20   Ursula Alert, MD  pantoprazole (PROTONIX) 40 MG tablet Take 40 mg by mouth 2 (two) times daily.    [provider]  pregabalin (LYRICA) 100 MG capsule Take 100 mg by mouth 3 (three) times daily. 08/30/20   [provider]  promethazine (PHENERGAN) 25 MG tablet Take 25 mg by mouth every 6 (six) hours as needed for nausea or vomiting. 12/22/19   [provider]  tiZANidine (ZANAFLEX) 2 MG tablet  Take 2 mg by mouth 3 (three) times daily. 08/11/20   [provider]  omeprazole (PRILOSEC) 10 MG capsule Take 1 capsule (10 mg total) by mouth daily. 11/16/18 11/28/18  Cuthriell, Charline Bills, PA-C    Allergies Phentermine, Quinolones, Macrobid [nitrofurantoin macrocrystal], and Septra [sulfamethoxazole-trimethoprim]  Family History  Problem Relation Age of Onset   Diabetes Mother    Alcohol abuse Father    Colon cancer Father    Alcohol abuse Brother    Hypertension Brother     Social History Social History   Tobacco Use   Smoking status: Former    Types: Cigarettes   Smokeless tobacco: Never  Vaping Use   Vaping Use: Never used  Substance Use Topics   Alcohol use: Yes    Comment: OCCAS   Drug use: Never    Review of Systems Constitutional: No fever/chills Eyes: No visual changes. ENT: No sore throat. Cardiovascular: Denies chest pain. Respiratory: Denies shortness  of breath. Gastrointestinal: No abdominal pain.  No nausea, no vomiting.  No diarrhea.  No constipation. Genitourinary: Negative for dysuria. Musculoskeletal: Negative for neck pain.  Negative for back pain. Integumentary: Negative for rash. Neurological: 2 seizure-like episodes over the last 2 weeks.  Intermittent compliance with medication regimen.  Brief episode of not moving her left arm after the seizure-like episode, then resolved.  Currently denies headache, numbness, and weakness in her extremities.   ____________________________________________   PHYSICAL EXAM:  VITAL SIGNS: ED Triage Vitals [10/11/20 1603]  Enc Vitals Group     BP (!) 144/75     Pulse Rate 81     Resp 18     Temp 98.5 F (36.9 C)     Temp Source Oral     SpO2 99 %     Weight 90.7 kg (200 lb)     Height 1.702 m (_0 )     Head Circumference      Peak Flow      Pain Score 8     Pain Loc      Pain Edu?      Excl. in [place]?     Constitutional: Alert and oriented.  Eyes: Conjunctivae are normal.  Pupils are  equal and reactive.  Extraocular movement is normal and intact.  No nystagmus. Head: Atraumatic. Nose: No congestion/rhinnorhea. Mouth/Throat: Patient is wearing a mask. Neck: No stridor.  No meningeal signs.   Cardiovascular: Normal rate, regular rhythm. Good peripheral circulation. Respiratory: Normal respiratory effort.  No retractions. Gastrointestinal: Soft and nontender. No distention.  Musculoskeletal: Patient showed me the surgical site in her left antecubital fossa for the lipomas removed I still feel a lesion, either residual lipoma or phlegmon, but does not appear infected, and the patient has followed up with Dr. Bary Castilla with surgery.  She said her arm has been stable since the surgery 2 weeks ago. Neurologic:  Normal speech and language. No gross focal neurologic deficits are appreciated.  Good grip strength bilaterally, normal major muscle groups strength in upper and lower extremities, no focal neurological deficits. Skin:  Skin is warm, dry and intact. Psychiatric: Mood and affect are normal. Speech and behavior are normal.  ____________________________________________   LABS (all labs ordered are listed, but only abnormal results are displayed)  Labs Reviewed  BASIC METABOLIC PANEL - Abnormal; Notable for the following components:      Result Value   Glucose, Bld 122 (*)    All other components within normal limits  CBC  CBG MONITORING, ED   ____________________________________________   INITIAL IMPRESSION / MDM / ASSESSMENT AND PLAN / ED COURSE  As part of my medical decision making, I reviewed the following data within the electronic MEDICAL RECORD NUMBER History obtained from family, Nursing notes reviewed and incorporated, Labs reviewed , Old chart reviewed, Notes from prior ED visits, and [place] Controlled Substance Database   Differential diagnosis includes, but is not limited to, nonadherence to medication regimen, medication interactions, electrolyte or metabolic  abnormality, acute intracranial lesion or CVA, Todd's paralysis, psychogenic causes.  Patient is well-appearing and in no distress.  She has been in the ED for about 8 hours and has had no recurrence of symptoms.  Her husband agrees that she is at her baseline.  She has had 2 brief episodes over the last 2 weeks in the setting of nonadherence to her medication regimen.  Her basic metabolic panel and CBC are within normal limits and her vital signs are stable.  I  reviewed her [place] controlled substance database and see the prescription for pregabalin and we talked about her medications at length.  I strongly encouraged her to continue taking all of her prescribed medications, particularly the ones with antiepileptic activity.  I explained that it is not recommended that I change her medications around, particularly in the setting of not knowing exactly what she has been taking recently.  She has seen Dr. Melrose Nakayama in the past and I strongly encouraged them to call his office to schedule the next available follow-up appointment.  They understand and agree with the plan and agree that there is no indication for her to stay in the hospital tonight.  No need for advanced imaging at this time.  I discharged him for close outpatient follow-up.  The patient was ambulating without any difficulty, in good spirits, and appearing well at the time of discharge.           ____________________________________________  FINAL CLINICAL IMPRESSION(S) / ED DIAGNOSES  Final diagnoses:  Seizure-like activity ([place])     MEDICATIONS GIVEN DURING THIS VISIT:  Medications - No data to display   ED Discharge Orders     None        Note:  This document was prepared using Dragon voice recognition software and may include unintentional dictation errors.   Hinda Kehr, MD 10/12/20 [phone]

## 2020-10-11 NOTE — Telephone Encounter (Signed)
she at er now she think the olanzapine might have caused her seizures. she has had 2 seizures.  she stated she been on it for 2 months now.  she had lipona from her left arm on the 11th.

## 2020-10-11 NOTE — Telephone Encounter (Signed)
Thank you for information. She is currently in ER getting help.

## 2020-10-11 NOTE — ED Notes (Signed)
Updated patient with verbal understanding, awaiting evaluation. Spouse remains at the bedside

## 2020-10-12 NOTE — Discharge Instructions (Addendum)
You have been seen in the emergency department today for a likely seizure.  Your workup today including labs are within normal limits.  Please follow up with your doctor as soon as possible regarding today's emergency department visit and your likely seizure.    You will also need to follow up with Dr. Melrose Nakayama as soon as possible, please call for appointment.    If you have been prescribed a medication for your seizures, please take this medication as prescribed.  As we have discussed it is very important that you DO NOT drive until you have been seen and cleared by your neurologist.  Please drink plenty of fluids, get plenty of sleep and avoid any alcohol or drug use.  Return to the emergency department if you have any further seizures, develop any weakness/numbness of any arm/leg, confusion, slurred speech, or sudden/severe headache.

## 2020-10-20 ENCOUNTER — Other Ambulatory Visit (HOSPITAL_COMMUNITY): Payer: Self-pay | Admitting: Physician Assistant

## 2020-10-20 ENCOUNTER — Other Ambulatory Visit: Payer: Self-pay | Admitting: Physician Assistant

## 2020-10-20 DIAGNOSIS — R202 Paresthesia of skin: Secondary | ICD-10-CM

## 2020-10-20 DIAGNOSIS — R200 Anesthesia of skin: Secondary | ICD-10-CM

## 2020-10-20 DIAGNOSIS — R569 Unspecified convulsions: Secondary | ICD-10-CM

## 2020-10-20 DIAGNOSIS — R404 Transient alteration of awareness: Secondary | ICD-10-CM

## 2020-10-31 ENCOUNTER — Ambulatory Visit: Payer: Medicare HMO

## 2020-11-13 ENCOUNTER — Ambulatory Visit
Admission: RE | Admit: 2020-11-13 | Discharge: 2020-11-13 | Disposition: A | Discharge status: Home / Self Care | Payer: Medicare HMO | Source: Ambulatory Visit | Attending: Physician Assistant | Admitting: Physician Assistant

## 2020-11-13 ENCOUNTER — Other Ambulatory Visit: Payer: Self-pay

## 2020-11-13 DIAGNOSIS — R404 Transient alteration of awareness: Secondary | ICD-10-CM | POA: Diagnosis present

## 2020-11-13 DIAGNOSIS — R202 Paresthesia of skin: Secondary | ICD-10-CM | POA: Diagnosis present

## 2020-11-13 DIAGNOSIS — R200 Anesthesia of skin: Secondary | ICD-10-CM | POA: Insufficient documentation

## 2020-11-13 DIAGNOSIS — R569 Unspecified convulsions: Secondary | ICD-10-CM | POA: Insufficient documentation

## 2020-11-13 MED ORDER — GADOBUTROL 1 MMOL/ML IV SOLN
9.0000 mL | Freq: Once | INTRAVENOUS | Status: AC | PRN
  Administered 2020-11-13: 9 mL via INTRAVENOUS

## 2020-11-27 DIAGNOSIS — R569 Unspecified convulsions: Secondary | ICD-10-CM | POA: Insufficient documentation

## 2020-12-06 ENCOUNTER — Other Ambulatory Visit: Payer: Self-pay

## 2020-12-06 ENCOUNTER — Telehealth (INDEPENDENT_AMBULATORY_CARE_PROVIDER_SITE_OTHER): Payer: Medicare HMO | Admitting: Psychiatry

## 2020-12-06 ENCOUNTER — Encounter: Payer: Self-pay | Admitting: Psychiatry

## 2020-12-06 DIAGNOSIS — G4701 Insomnia due to medical condition: Secondary | ICD-10-CM | POA: Diagnosis not present

## 2020-12-06 DIAGNOSIS — F3178 Bipolar disorder, in full remission, most recent episode mixed: Secondary | ICD-10-CM

## 2020-12-06 DIAGNOSIS — F411 Generalized anxiety disorder: Secondary | ICD-10-CM | POA: Diagnosis not present

## 2020-12-06 DIAGNOSIS — F4321 Adjustment disorder with depressed mood: Secondary | ICD-10-CM | POA: Diagnosis not present

## 2020-12-06 DIAGNOSIS — R259 Unspecified abnormal involuntary movements: Secondary | ICD-10-CM

## 2020-12-06 DIAGNOSIS — F1421 Cocaine dependence, in remission: Secondary | ICD-10-CM

## 2020-12-06 MED ORDER — OLANZAPINE 7.5 MG PO TABS: 7.5000 mg | ORAL_TABLET | Freq: Every day | ORAL | 1 refills | Status: DC

## 2020-12-06 MED ORDER — ESCITALOPRAM OXALATE 10 MG PO TABS: 10.0000 mg | ORAL_TABLET | Freq: Every day | ORAL | 0 refills | Status: DC

## 2020-12-06 NOTE — Progress Notes (Signed)
Virtual Visit via Video Note  I connected with Jennifer Welch on 12/06/20 at  2:30 PM EDT by a video enabled telemedicine application and verified that I am speaking with the correct person using two identifiers.  Location Provider Location : [facility] Patient Location : Home  Participants: Patient , Spouse,Provider    I discussed the limitations of evaluation and management by telemedicine and the availability of in person appointments. The patient expressed understanding and agreed to proceed.    I discussed the assessment and treatment plan with the patient. The patient was provided an opportunity to ask questions and all were answered. The patient agreed with the plan and demonstrated an understanding of the instructions.   The patient was advised to call back or seek an in-person evaluation if the symptoms worsen or if the condition fails to improve as anticipated.  [place] MD OP Progress Note  12/07/2020 9:27 AM Jennifer Welch  MRN:  [id number]  Chief Complaint:  Chief Complaint   Follow-up; Anxiety    HPI: Jennifer Welch is a 60 year old African-American female, married, lives in [place], has a history of bipolar disorder, GAD, cocaine use disorder in remission, gastroesophageal reflux disease, IBS, carpal tunnel syndrome was evaluated by telemedicine today.  Patient had recent seizure-like activity, witnessed by her spouse.  This happened twice in July 2022.  As per husband he witnessed the seizures which involved shaking all over, guttural sounds, being confused after that.  Reviewed notes per Dr. Hinda Kehr dated 10/11/2020-patient's BMP, CBC within normal limits.  Patient was encouraged to continue taking all her medications.  Patient was advised to follow up with neurologist-Dr. Melrose Nakayama.  Patient per neurologist was scheduled for 3-hour EEG, brain MRI with and without contrast and was advised to continue her medications like Trileptal, gabapentin, Lyrica, tizanidine and  Lexapro.  Patient today reports she has been overall depressed about her current health problems.  She is worried about the seizure-like spells that she had recently.  She reports she had COVID-19 infection few months ago and around the time that she had seizures she had stopped taking all her medications.  She wonders whether that could have triggered her seizure-like spells.  She denies abusing any illicit drugs or alcohol.    Patient reports when she had the seizures she may have been off of all her medications for at least a month.  However when she had the seizure she got back on all her medications including the olanzapine.  She wonders whether the olanzapine could be causing her side effects of abnormal movements of her jaw as well as lower extremities.  However she is not sure.  She reports sleep is overall okay at this time.  The olanzapine does help with that.  She denies any suicidality, homicidality or perceptual disturbances.  Collateral information obtained from spouse who was present in session-reports he witnessed patient having seizures.  The first episode lasted 30 seconds she had twitching of her arm and then she had shaking all over her body.  She was confused for a few minutes after the episode.  Patient had another episode a few days later which lasted for 2 minutes.  She did not have any urinary incontinence, tongue biting during the episode.    Visit Diagnosis:    ICD-10-CM   1. Bipolar disorder, in full remission, most recent episode mixed (HCC)  F31.78 OLANZapine (ZYPREXA) 7.5 MG tablet    2. GAD (generalized anxiety disorder)  F41.1 OLANZapine (ZYPREXA) 7.5 MG tablet  escitalopram (LEXAPRO) 10 MG tablet    3. Insomnia due to medical condition  G47.01    mood, pain    4. Adjustment disorder with depressed mood  F43.21     5. Abnormal involuntary movement  R25.9 OLANZapine (ZYPREXA) 7.5 MG tablet    6. Cocaine use disorder, moderate, in sustained remission ([place])   F14.21       Past Psychiatric History: Reviewed past psychiatric history from progress note on 04/06/2019.  Past trials of medications- Seroquel Trileptal Prozac Lexapro Xanax Klonopin Trazodone  Past Medical History:  Past Medical History:  Diagnosis Date   Anxiety    Bipolar 1 disorder ([place])    Depression    Hypertensive disorder 02/09/2019   Neuromuscular disorder [facility])    CERVICAL DDD    Past Surgical History:  Procedure Laterality Date   ABDOMINAL HYSTERECTOMY     BONE EXCISION Right 12/15/2018   Procedure: PARTIAL EXCISION BONE-PHALANX RIGHT WITH FLUOROSCOPY;  Surgeon: Caroline More, DPM;  Location: [facility];  Service: Podiatry;  Laterality: Right;  MAC ANESTHESIA   BREAST SURGERY     ECTOPIC PREGNANCY SURGERY     FOOT SURGERY     HAND TENDON SURGERY     KNEE SURGERY     LIPOMA EXCISION Left 09/25/2020   Procedure: EXCISION LIPOMAS;  Surgeon: Robert Bellow, MD;  Location: [facility];  Service: General;  Laterality: Left;  arm   OVARIAN CYST REMOVAL      Family Psychiatric History: Reviewed family psychiatric history from progress note on 04/06/2019  Family History:  Family History  Problem Relation Age of Onset   Diabetes Mother    Alcohol abuse Father    Colon cancer Father    Alcohol abuse Brother    Hypertension Brother     Social History: Reviewed social history from progress note on 04/06/2019 Social History   Socioeconomic History   Marital status: Married    Spouse name: Jennifer Welch   Number of children: 3   Years of education: Not on file   Highest education level: Some college, no degree  Occupational History   Not on file  Tobacco Use   Smoking status: Former    Types: Cigarettes   Smokeless tobacco: Never  Vaping Use   Vaping Use: Never used  Substance and Sexual Activity   Alcohol use: Yes    Comment: OCCAS   Drug use: Never   Sexual activity: Not Currently  Other Topics Concern   Not on file  Social History  Narrative   Not on file   Social Determinants of Health   Financial Resource Strain: Not on file  Food Insecurity: Not on file  Transportation Needs: Not on file  Physical Activity: Not on file  Stress: Not on file  Social Connections: Not on file    Allergies:  Allergies  Allergen Reactions   Phentermine Other (See Comments)    Vivid dreams/DRUG INTERACTION NOT ALLERGIC Vivid dreams/DRUG INTERACTION NOT ALLERGIC    Quinolones Nausea Only and Other (See Comments)   Macrobid [Nitrofurantoin Macrocrystal] Rash   Septra [Sulfamethoxazole-Trimethoprim] Rash    Metabolic Disorder Labs: Lab Results  Component Value Date   HGBA1C 5.6 09/08/2019   MPG 114 09/08/2019   No results found for: PROLACTIN Lab Results  Component Value Date   CHOL 198 09/08/2019   TRIG 33 09/08/2019   HDL 88 09/08/2019   CHOLHDL 2.3 09/08/2019   VLDL 7 09/08/2019   LDLCALC 103 (H) 09/08/2019  Lab Results  Component Value Date   TSH 0.704 09/08/2019    Therapeutic Level Labs: No results found for: LITHIUM No results found for: VALPROATE No components found for:  CBMZ  Current Medications: Current Outpatient Medications  Medication Sig Dispense Refill   OLANZapine (ZYPREXA) 7.5 MG tablet Take 1 tablet (7.5 mg total) by mouth at bedtime. 30 tablet 1   tiZANidine (ZANAFLEX) 2 MG tablet Take 1 tablet by mouth 3 (three) times daily.     escitalopram (LEXAPRO) 10 MG tablet Take 1 tablet (10 mg total) by mouth daily. 90 tablet 0   gabapentin (NEURONTIN) 100 MG capsule Take 100 mg by mouth 3 (three) times daily.     HYDROcodone-acetaminophen (NORCO/VICODIN) 5-325 MG tablet Take 1 tablet by mouth every 4 (four) hours as needed for moderate pain. 10 tablet 0   hydrOXYzine (ATARAX/VISTARIL) 25 MG tablet Take 25 mg by mouth 3 (three) times daily.     meloxicam (MOBIC) 15 MG tablet Take 1 tablet (15 mg total) by mouth daily. 30 tablet 0   naloxone (NARCAN) 4 MG/0.1ML LIQD nasal spray kit Place 1 spray  into the nose once.     oxcarbazepine (TRILEPTAL) 600 MG tablet Take 1 tablet (600 mg total) by mouth daily. 90 tablet 0   pantoprazole (PROTONIX) 40 MG tablet Take 40 mg by mouth 2 (two) times daily.     pregabalin (LYRICA) 100 MG capsule Take 100 mg by mouth 3 (three) times daily.     promethazine (PHENERGAN) 25 MG tablet Take 25 mg by mouth every 6 (six) hours as needed for nausea or vomiting.     tiZANidine (ZANAFLEX) 2 MG tablet Take 2 mg by mouth 3 (three) times daily.     No current facility-administered medications for this visit.     Musculoskeletal: Strength & Muscle Tone:  UTA Gait & Station:  Seated Patient leans: N/A  Psychiatric Specialty Exam: Review of Systems  Neurological:        Restlessness of feet and jaw movements ( patient unsure if due to her dentures)  Psychiatric/Behavioral:  Positive for dysphoric mood.   All other systems reviewed and are negative.  There were no vitals taken for this visit.There is no height or weight on file to calculate BMI.  General Appearance: Casual  Eye Contact:  Fair  Speech:  Clear and Coherent  Volume:  Normal  Mood:  Depressed  Affect:  Appropriate  Thought Process:  Goal Directed and Descriptions of Associations: Intact  Orientation:  Full (Time, Place, and Person)  Thought Content: Logical   Suicidal Thoughts:  No  Homicidal Thoughts:  No  Memory:  Immediate;   Fair Recent;   Fair Remote;   Fair  Judgement:  Fair  Insight:  Fair  Psychomotor Activity:  Normal  Concentration:  Concentration: Fair and Attention Span: Fair  Recall:  [employer] of Knowledge: Fair  Language: Fair  Akathisia:  No  Handed:  Right  AIMS (if indicated): done - 3  Assets:  Communication Skills Housing Intimacy Social Support Transportation  ADL's:  Intact  Cognition: WNL  Sleep:  Fair   Screenings: AIMS    Flowsheet Row Video Visit from 12/06/2020 in [facility] Total Score 3      GAD-7     Flowsheet Row Video Visit from 07/12/2020 in [facility]  Total GAD-7 Score 7      PHQ2-9    [place] Office Visit from 09/06/2020  in [facility] Video Visit from 07/12/2020 in [facility] Video Visit from 06/21/2020 in [facility]  PHQ-2 Total Score _0 PHQ-9 Total Score 3 7 --      Flowsheet Row ED from 10/11/2020 in [facility] Admission (Discharged) from 09/25/2020 in [facility] Testing 45 from 09/20/2020 in [facility] TESTING  C-SSRS RISK CATEGORY No Risk No Risk No Risk        Assessment and Plan: Elysa Amirault is a 60 year old African-American female, married, on disability, lives in [place] with her husband, has a history of bipolar disorder, GAD, IBS, chronic back pain, multiple surgeries, cocaine use disorder in remission was evaluated by telemedicine today.  Patient is currently status post seizure-like spells in July 2022, currently under the care of neurology, undergoing work-up.  Patient does report depression likely secondary to having current health problems, psychosocial stressors.  She will benefit from the following plan.  Plan Bipolar disorder in remission Olanzapine as prescribed Trileptal 600 mg p.o. daily Lexapro 10 mg p.o. daily [place] - 3  Adjustment disorder with depressed mood-unstable Referral for CBT with Ms. Christina Hussami  GAD-unstable Referral for CBT Hydroxyzine 25 mg p.o. twice daily as needed Patient advised to speak to neurologist prior to taking any of her psychotropic medications due to her recent seizure-like spells.  Insomnia-stable Continue olanzapine as prescribed  Abnormal involuntary movement-likely secondary to psychotropics-unstable Patient advised to come into the office for in  person visit for evaluation. However for now we will change the olanzapine to a lower dose of 7.5 mg p.o. nightly  Cocaine use disorder in sustained remission We will monitor closely.  Collateral information obtained from most recent visit to emergency department- Dr. Karma Greaser as noted above.  Collateral information obtained from most recent visit to neurologist-Dr. Melrose Nakayama 10/13/2020-as noted above.  Patient is currently scheduled for extended EEG  Collateral information obtained from spouse-as noted above.  Follow-up in clinic in person in 4 weeks.  This note was generated in part or whole with voice recognition software. Voice recognition is usually quite accurate but there are transcription errors that can and very often do occur. I apologize for any typographical errors that were not detected and corrected.        Ursula Alert, MD 12/07/2020, 9:27 AM

## 2020-12-14 DIAGNOSIS — R200 Anesthesia of skin: Secondary | ICD-10-CM | POA: Insufficient documentation

## 2021-01-02 ENCOUNTER — Ambulatory Visit: Payer: Medicare HMO | Admitting: Psychiatry

## 2021-01-03 ENCOUNTER — Ambulatory Visit (INDEPENDENT_AMBULATORY_CARE_PROVIDER_SITE_OTHER): Payer: Medicare HMO | Admitting: Licensed Clinical Social Worker

## 2021-01-03 DIAGNOSIS — F3178 Bipolar disorder, in full remission, most recent episode mixed: Secondary | ICD-10-CM | POA: Diagnosis not present

## 2021-01-03 DIAGNOSIS — F411 Generalized anxiety disorder: Secondary | ICD-10-CM | POA: Diagnosis not present
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
## 2021-01-08 NOTE — Plan of Care (Signed)
Developed tx plan and goals with input from pt.

## 2021-01-09 ENCOUNTER — Ambulatory Visit: Payer: Medicare HMO | Admitting: Psychiatry

## 2021-01-29 ENCOUNTER — Ambulatory Visit: Payer: Medicare HMO | Admitting: Psychiatry

## 2021-02-12 ENCOUNTER — Ambulatory Visit: Payer: Medicare HMO | Admitting: Psychiatry

## 2021-02-13 ENCOUNTER — Other Ambulatory Visit: Payer: Self-pay

## 2021-02-13 ENCOUNTER — Ambulatory Visit (INDEPENDENT_AMBULATORY_CARE_PROVIDER_SITE_OTHER): Payer: Self-pay | Admitting: Licensed Clinical Social Worker

## 2021-02-13 DIAGNOSIS — Z91199 Patient's noncompliance with other medical treatment and regimen due to unspecified reason: Secondary | ICD-10-CM

## 2021-02-13 NOTE — Progress Notes (Signed)
LCSW counselor tried to connect with patient for scheduled appointment via MyChart video text request x 2 and email request; also tried to connect via phone without success. LCSW counselor left message for patient to call office number to reschedule OPT appointment.

## 2021-03-06 ENCOUNTER — Encounter: Payer: Self-pay | Admitting: Psychiatry

## 2021-03-06 ENCOUNTER — Telehealth (INDEPENDENT_AMBULATORY_CARE_PROVIDER_SITE_OTHER): Payer: Medicare HMO | Admitting: Psychiatry

## 2021-03-06 DIAGNOSIS — F3178 Bipolar disorder, in full remission, most recent episode mixed: Secondary | ICD-10-CM | POA: Diagnosis not present

## 2021-03-06 DIAGNOSIS — G4701 Insomnia due to medical condition: Secondary | ICD-10-CM

## 2021-03-06 DIAGNOSIS — F411 Generalized anxiety disorder: Secondary | ICD-10-CM

## 2021-03-06 DIAGNOSIS — F1421 Cocaine dependence, in remission: Secondary | ICD-10-CM | POA: Diagnosis not present

## 2021-03-06 MED ORDER — OXCARBAZEPINE 600 MG PO TABS: 600.0000 mg | ORAL_TABLET | Freq: Every day | ORAL | 0 refills | Status: DC

## 2021-03-06 MED ORDER — OLANZAPINE 7.5 MG PO TABS: 7.5000 mg | ORAL_TABLET | Freq: Every day | ORAL | 2 refills | Status: DC

## 2021-03-06 MED ORDER — ESCITALOPRAM OXALATE 10 MG PO TABS: 10.0000 mg | ORAL_TABLET | Freq: Every day | ORAL | 0 refills | Status: DC

## 2021-03-06 NOTE — Progress Notes (Signed)
Virtual Visit via Video Note  I connected with Jennifer Welch on 03/06/21 at  3:00 PM EST by a video enabled telemedicine application and verified that I am speaking with the correct person using two identifiers.  Location Provider Location : [facility] Patient Location : Home  Participants: Patient , Provider   I discussed the limitations of evaluation and management by telemedicine and the availability of in person appointments. The patient expressed understanding and agreed to proceed.   I discussed the assessment and treatment plan with the patient. The patient was provided an opportunity to ask questions and all were answered. The patient agreed with the plan and demonstrated an understanding of the instructions.   The patient was advised to call back or seek an in-person evaluation if the symptoms worsen or if the condition fails to improve as anticipated.   [facility] MD OP Progress Note  03/06/2021 3:29 PM Luria Rosario  MRN:  [id number]  Chief Complaint:  Chief Complaint   Follow-up; Anxiety; Depression    HPI: Jennifer Welch is a 60 year old African-American female, married, lives in [place], has a history of bipolar disorder, GAD, cocaine use disorder in remission, gastroesophageal reflux disease, IBS, carpal tunnel syndrome was evaluated by telemedicine today.  Patient today reports she is currently doing fairly well with regards to her mood.  Denies any significant depression, anxiety or mood lability.  Patient reports sleep is overall okay.  She is compliant on the medications although her refills were provided several months ago, reports she had previous supplies left and she has not stopped taking any of her medications.  She denies side effects.  Patient reports she has not had any seizure-like spells since her last visit with Probation officer.  Patient continues to stay away from cocaine, alcohol and other substances.  Patient is interested in continuing psychotherapy sessions  and is motivated to do so.  Patient denies any other concerns today.  Visit Diagnosis:    ICD-10-CM   1. Bipolar disorder, in full remission, most recent episode mixed (HCC)  F31.78 OLANZapine (ZYPREXA) 7.5 MG tablet    2. GAD (generalized anxiety disorder)  F41.1 OLANZapine (ZYPREXA) 7.5 MG tablet    escitalopram (LEXAPRO) 10 MG tablet    3. Insomnia due to medical condition  G47.01    mood    4. Cocaine use disorder, moderate, in sustained remission (HCC)  F14.21 oxcarbazepine (TRILEPTAL) 600 MG tablet      Past Psychiatric History: Reviewed past psychiatric history from progress note on 04/06/2019.  Past trials of medications Seroquel Trileptal Prozac Lexapro Xanax Klonopin Trazodone  Past Medical History:  Past Medical History:  Diagnosis Date   Anxiety    Bipolar 1 disorder ([place])    Depression    Hypertensive disorder 02/09/2019   Neuromuscular disorder [facility])    CERVICAL DDD    Past Surgical History:  Procedure Laterality Date   ABDOMINAL HYSTERECTOMY     BONE EXCISION Right 12/15/2018   Procedure: PARTIAL EXCISION BONE-PHALANX RIGHT WITH FLUOROSCOPY;  Surgeon: Caroline More, DPM;  Location: [facility];  Service: Podiatry;  Laterality: Right;  MAC ANESTHESIA   BREAST SURGERY     ECTOPIC PREGNANCY SURGERY     FOOT SURGERY     HAND TENDON SURGERY     KNEE SURGERY     LIPOMA EXCISION Left 09/25/2020   Procedure: EXCISION LIPOMAS;  Surgeon: Robert Bellow, MD;  Location: [facility];  Service: General;  Laterality: Left;  arm   OVARIAN CYST REMOVAL  Family Psychiatric History: Reviewed family psychiatric history from progress note on 04/06/2019  Family History:  Family History  Problem Relation Age of Onset   Diabetes Mother    Alcohol abuse Father    Colon cancer Father    Alcohol abuse Brother    Hypertension Brother     Social History: Reviewed social history from progress note on 04/06/2019 Social History   Socioeconomic History    Marital status: Married    Spouse name: darwin Cirigliano   Number of children: 3   Years of education: Not on file   Highest education level: Some college, no degree  Occupational History   Not on file  Tobacco Use   Smoking status: Former    Types: Cigarettes   Smokeless tobacco: Never  Vaping Use   Vaping Use: Never used  Substance and Sexual Activity   Alcohol use: Yes    Comment: OCCAS   Drug use: Never   Sexual activity: Not Currently  Other Topics Concern   Not on file  Social History Narrative   Not on file   Social Determinants of Health   Financial Resource Strain: Not on file  Food Insecurity: Not on file  Transportation Needs: Not on file  Physical Activity: Not on file  Stress: Not on file  Social Connections: Not on file    Allergies:  Allergies  Allergen Reactions   Phentermine Other (See Comments)    Vivid dreams/DRUG INTERACTION NOT ALLERGIC Vivid dreams/DRUG INTERACTION NOT ALLERGIC    Quinolones Nausea Only and Other (See Comments)   Macrobid [Nitrofurantoin Macrocrystal] Rash   Septra [Sulfamethoxazole-Trimethoprim] Rash    Metabolic Disorder Labs: Lab Results  Component Value Date   HGBA1C 5.6 09/08/2019   MPG 114 09/08/2019   No results found for: PROLACTIN Lab Results  Component Value Date   CHOL 198 09/08/2019   TRIG 33 09/08/2019   HDL 88 09/08/2019   CHOLHDL 2.3 09/08/2019   VLDL 7 09/08/2019   LDLCALC 103 (H) 09/08/2019   Lab Results  Component Value Date   TSH 0.704 09/08/2019    Therapeutic Level Labs: No results found for: LITHIUM No results found for: VALPROATE No components found for:  CBMZ  Current Medications: Current Outpatient Medications  Medication Sig Dispense Refill   gabapentin (NEURONTIN) 100 MG capsule Take 100 mg by mouth 3 (three) times daily.     HYDROcodone-acetaminophen (NORCO/VICODIN) 5-325 MG tablet Take 1 tablet by mouth every 4 (four) hours as needed for moderate pain. 10 tablet 0   hydrOXYzine  (ATARAX/VISTARIL) 25 MG tablet Take 25 mg by mouth 3 (three) times daily.     naloxone (NARCAN) 4 MG/0.1ML LIQD nasal spray kit Place 1 spray into the nose once.     pantoprazole (PROTONIX) 40 MG tablet Take 40 mg by mouth 2 (two) times daily. Takes as needed     pregabalin (LYRICA) 100 MG capsule Take 100 mg by mouth 3 (three) times daily.     promethazine (PHENERGAN) 25 MG tablet Take 25 mg by mouth every 6 (six) hours as needed for nausea or vomiting.     tiZANidine (ZANAFLEX) 2 MG tablet Take 2 mg by mouth 3 (three) times daily.     escitalopram (LEXAPRO) 10 MG tablet Take 1 tablet (10 mg total) by mouth daily. 90 tablet 0   meloxicam (MOBIC) 15 MG tablet Take 1 tablet (15 mg total) by mouth daily. (Patient not taking: Reported on 03/06/2021) 30 tablet 0   OLANZapine (ZYPREXA) 7.5  MG tablet Take 1 tablet (7.5 mg total) by mouth at bedtime. 30 tablet 2   oxcarbazepine (TRILEPTAL) 600 MG tablet Take 1 tablet (600 mg total) by mouth daily. 90 tablet 0   tiZANidine (ZANAFLEX) 2 MG tablet Take 1 tablet by mouth 3 (three) times daily. (Patient not taking: Reported on 03/06/2021)     No current facility-administered medications for this visit.     Musculoskeletal: Strength & Muscle Tone:  UTA Gait & Station: normal Patient leans: N/A  Psychiatric Specialty Exam: Review of Systems  Psychiatric/Behavioral:  Negative for agitation, behavioral problems, confusion, decreased concentration, dysphoric mood, hallucinations, self-injury, sleep disturbance and suicidal ideas. The patient is not nervous/anxious and is not hyperactive.   All other systems reviewed and are negative.  There were no vitals taken for this visit.There is no height or weight on file to calculate BMI.  General Appearance: Casual  Eye Contact:  Fair  Speech:  Clear and Coherent  Volume:  Normal  Mood:  Euthymic  Affect:  Congruent  Thought Process:  Goal Directed and Descriptions of Associations: Intact  Orientation:  Full  (Time, Place, and Person)  Thought Content: Logical   Suicidal Thoughts:  No  Homicidal Thoughts:  No  Memory:  Immediate;   Fair Recent;   Fair Remote;   Fair  Judgement:  Fair  Insight:  Fair  Psychomotor Activity:  Normal  Concentration:  Concentration: Fair and Attention Span: Fair  Recall:  [employer] of Knowledge: Fair  Language: Fair  Akathisia:  No  Handed:  Right  AIMS (if indicated): done, 0  Assets:  Communication Skills Desire for Improvement Housing Intimacy Social Support Transportation  ADL's:  Intact  Cognition: WNL  Sleep:  Fair   Screenings: AIMS    Flowsheet Row Video Visit from 12/06/2020 in [facility] Total Score 3      GAD-7    Flowsheet Row Video Visit from 07/12/2020 in [facility]  Total GAD-7 Score 7      PHQ2-9    [place] Video Visit from 03/06/2021 in [facility] Office Visit from 09/06/2020 in [facility] Video Visit from 07/12/2020 in [facility] Video Visit from 06/21/2020 in [facility]  PHQ-2 Total Score _0 PHQ-9 Total Score -- 3 7 --      Flowsheet Row Video Visit from 03/06/2021 in [facility] ED from 10/11/2020 in [facility] Admission (Discharged) from 09/25/2020 in [facility] No Risk No Risk No Risk        Assessment and Plan: Jennifer Welch is a 60 year old African-American female, married, on disability, lives in [place] with her husband, has a history of bipolar disorder, GAD, IBS, chronic back pain, multiple surgeries, cocaine use disorder in remission was evaluated by telemedicine today.  Patient is currently stable.  Plan as noted below.  Plan Bipolar disorder in remission Olanzapine as  prescribed Trileptal 600 mg p.o. daily Lexapro 10 mg p.o. daily  GAD-stable Continue CBT-I have sent communication to front desk to schedule this patient with a therapist. Hydroxyzine 25 mg p.o. twice daily as needed  Insomnia-stable Continue olanzapine 7.5 mg at bedtime.  Cocaine use disorder in remission Will monitor closely  Follow-up in clinic in 3 months or sooner if needed.  This note was generated in part or whole with voice recognition  software. Voice recognition is usually quite accurate but there are transcription errors that can and very often do occur. I apologize for any typographical errors that were not detected and corrected.      Ursula Alert, MD 03/07/2021, 11:47 AM

## 2021-04-14 ENCOUNTER — Emergency Department
Admission: EM | Admit: 2021-04-14 | Discharge: 2021-04-14 | Disposition: A | Discharge status: Home / Self Care | Payer: Medicare Other | Attending: Emergency Medicine | Admitting: Emergency Medicine

## 2021-04-14 ENCOUNTER — Other Ambulatory Visit: Payer: Self-pay

## 2021-04-14 DIAGNOSIS — T23212A Burn of second degree of left thumb (nail), initial encounter: Secondary | ICD-10-CM | POA: Diagnosis not present

## 2021-04-14 DIAGNOSIS — X150XXA Contact with hot stove (kitchen), initial encounter: Secondary | ICD-10-CM | POA: Insufficient documentation

## 2021-04-14 DIAGNOSIS — Z23 Encounter for immunization: Secondary | ICD-10-CM | POA: Insufficient documentation

## 2021-04-14 DIAGNOSIS — Y93G3 Activity, cooking and baking: Secondary | ICD-10-CM | POA: Diagnosis not present

## 2021-04-14 DIAGNOSIS — T23012A Burn of unspecified degree of left thumb (nail), initial encounter: Secondary | ICD-10-CM | POA: Diagnosis present

## 2021-04-14 MED ORDER — TETANUS-DIPHTH-ACELL PERTUSSIS 5-2.5-18.5 LF-MCG/0.5 IM SUSY
0.5000 mL | PREFILLED_SYRINGE | Freq: Once | INTRAMUSCULAR | Status: AC
  Administered 2021-04-14: 0.5 mL via INTRAMUSCULAR
  Filled 2021-04-14: qty 0.5

## 2021-04-14 MED ORDER — IBUPROFEN 600 MG PO TABS
600.0000 mg | ORAL_TABLET | Freq: Once | ORAL | Status: AC
  Administered 2021-04-14: 600 mg via ORAL
  Filled 2021-04-14: qty 1

## 2021-04-14 MED ORDER — OXYCODONE-ACETAMINOPHEN 5-325 MG PO TABS: 1.0000 | ORAL_TABLET | Freq: Four times a day (QID) | ORAL | 0 refills | Status: AC | PRN
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
## 2021-04-14 NOTE — ED Notes (Signed)
Pt signed printed d/c paperwork.  

## 2021-04-14 NOTE — ED Provider Notes (Signed)
[facility] Provider Note    Event Date/Time   First MD Initiated Contact with Patient 04/14/21 2050     (approximate)   History   Burn   HPI  Jennifer Welch is a 61 y.o. female with a past medical history of IBS, hypertension, substance abuse who comes the ED complaining of pain in the left thumb due to a burn sustained 1 week ago while cooking.  She reports that she has been keeping it dressed with antibiotic ointment and gauze for the past week, but the dressing keeps sticking to the wound.  She applies hydrogen peroxide to be able to get the dressing off, but when she removes it it peels away the healing skin.  She has persistent pain and comes to the ED for evaluation.  No fever, no other injuries.  Last tetanus shot unknown.     Physical Exam   Triage Vital Signs: ED Triage Vitals [04/14/21 1949]  Enc Vitals Group     BP (!) 148/72     Pulse Rate 100     Resp 18     Temp 98.7 F (37.1 C)     Temp Source Oral     SpO2 98 %     Weight      Height      Head Circumference      Peak Flow      Pain Score 10     Pain Loc      Pain Edu?      Excl. in [place]?     Most recent vital signs: Vitals:   04/14/21 1949 04/14/21 2045  BP: (!) 148/72 133/74  Pulse: 100 90  Resp: 18 19  Temp: 98.7 F (37.1 C)   SpO2: 98% 100%     General: Awake, no distress.  CV:  Good peripheral perfusion.  Normal radial pulse, normal capillary refill in the thumb tip Resp:  Normal effort.  Abd:  No distention.  Other:  No edema.  Left thumb has partial-thickness burn over the extensor surface from proximal nail bed to MCP joint.  Intact range of motion.  No significant swelling or induration, no purulent drainage.  Does not appear infected.  Tissue is raw but pink with intact sensation and capillary refill.  No eschar formation   ED Results / Procedures / Treatments   Labs (all labs ordered are listed, but only abnormal results are displayed) Labs Reviewed -  No data to display   EKG     RADIOLOGY     PROCEDURES:  Critical Care performed: No  Procedures   MEDICATIONS ORDERED IN ED: Medications  ibuprofen (ADVIL) tablet 600 mg (600 mg Oral Given 04/14/21 2054)  Tdap (BOOSTRIX) injection 0.5 mL (0.5 mLs Intramuscular Given 04/14/21 2052)     IMPRESSION / MDM / ASSESSMENT AND PLAN / ED COURSE  I reviewed the triage vital signs and the nursing notes.                                  Patient presents with burn over her extensor surface of the left thumb that has been present for 1 week.  Does not appear infected.  Its partial-thickness.  Its not circumferential.  She has good distal perfusion.  Patient provided with wound care supplies with Xeroform petroleum gauze dressing, nonadherent gauze, gauze wraps.  I applied this dressing and instructed the patient on how to  change the dressing herself at home.  Tetanus updated.  Counseled to follow-up with primary care.      FINAL CLINICAL IMPRESSION(S) / ED DIAGNOSES   Final diagnoses:  Partial thickness burn of left thumb, initial encounter     Rx / DC Orders   ED Discharge Orders          Ordered    oxyCODONE-acetaminophen (PERCOCET) 5-325 MG tablet  Every 6 hours PRN        04/14/21 2106             Note:  This document was prepared using Dragon voice recognition software and may include unintentional dictation errors.   Carrie Mew, MD 04/14/21 2112

## 2021-04-17 ENCOUNTER — Ambulatory Visit (INDEPENDENT_AMBULATORY_CARE_PROVIDER_SITE_OTHER): Payer: Medicare Other | Admitting: Licensed Clinical Social Worker

## 2021-04-17 ENCOUNTER — Other Ambulatory Visit: Payer: Self-pay

## 2021-04-17 DIAGNOSIS — F3178 Bipolar disorder, in full remission, most recent episode mixed: Secondary | ICD-10-CM

## 2021-04-17 DIAGNOSIS — F411 Generalized anxiety disorder: Secondary | ICD-10-CM

## 2021-04-17 NOTE — Progress Notes (Signed)
Virtual Visit via Video Note  I connected with Jennifer Welch on 04/17/21 at  2:00 PM EST by a video enabled telemedicine application and verified that I am speaking with the correct person using two identifiers.  Location: Patient: home Provider: remote office [place], [place])   I discussed the limitations of evaluation and management by telemedicine and the availability of in person appointments. The patient expressed understanding and agreed to proceed.   I discussed the assessment and treatment plan with the patient. The patient was provided an opportunity to ask questions and all were answered. The patient agreed with the plan and demonstrated an understanding of the instructions.   The patient was advised to call back or seek an in-person evaluation if the symptoms worsen or if the condition fails to improve as anticipated.  I provided 30 minutes of non-face-to-face time during this encounter.   Tymere Depuy R Dawnetta Copenhaver, LCSW   THERAPIST PROGRESS NOTE  Session Time: 2-230p  Participation Level: Active  Behavioral Response: Neat and Well GroomedAlertAnxious  Type of Therapy: Individual Therapy  Treatment Goals addressed: Problem: Reduce overall frequency, intensity, and duration of the anxiety so that daily functioning is not impaired. Goal: LTG: Patient will score less than 5 on the Generalized Anxiety Disorder 7 Scale (GAD-7) Outcome: Progressing Goal: STG: Patient will participate in at least 80% of scheduled individual psychotherapy sessions Outcome: Progressing Problem: Decrease depressive symptoms and improve levels of effective functioning Goal: LTG: Reduce frequency, intensity, and duration of depression symptoms as evidenced by: SSB input needed on appropriate metric Outcome: Not Progressing Note: Pt having some acute grief after sudden loss of dog Goal: STG: Jennifer Welch WILL PARTICIPATE IN AT LEAST 80% OF SCHEDULED INDIVIDUAL PSYCHOTHERAPY SESSIONS Outcome:  Progressing Interventions:  Intervention: Encourage patient to identify triggers  Intervention: Provide grief and bereavement support  Intervention: Encourage self-care activities  Summary: Jennifer Welch is a 61 y.o. female who presents with improving symptoms related to bipolar disorder diagnosis.   Allowed pt to explore and express thoughts and feelings associated with recent life situations and external stressors. Pt is currently grieving loss of her dog. Pt states that her husband was gone to pick up dog's ashes prior to appointment. Discussed stages of grief and discussed pts previous experiences with loss--both family members and pets. Encouraged pt to focus on self care and social supports. Pt feels she is using good coping skills to manage symptoms.  Continued recommendations are as follows: self care behaviors, positive social engagements, focusing on overall work/home/life balance, and focusing on positive physical and emotional wellness.   Suicidal/Homicidal: No  Therapist Response: Pt is continuing to apply interventions learned in session into daily life situations. Pt is currently on track to meet goals utilizing interventions mentioned above. Personal growth and progress noted. Treatment to continue as indicated.    Plan: Return again in 4 weeks.  Diagnosis: Axis I: Bipolar disorder, mixed; generalized anxiety disorder    Axis II: No diagnosis    Jennifer Bo Veona Bittman, LCSW 04/17/2021

## 2021-04-17 NOTE — Plan of Care (Signed)
°  Problem: Decrease depressive symptoms and improve levels of effective functioning Goal: LTG: Reduce frequency, intensity, and duration of depression symptoms as evidenced by: SSB input needed on appropriate metric Outcome: Not Progressing Note: Pt having some acute grief after sudden loss of dog Goal: STG: Jennifer Welch WILL PARTICIPATE IN AT LEAST 80% OF SCHEDULED INDIVIDUAL PSYCHOTHERAPY SESSIONS Outcome: Progressing Intervention: Provide grief and bereavement support Intervention: Encourage self-care activities   Problem: Reduce overall frequency, intensity, and duration of the anxiety so that daily functioning is not impaired. Goal: LTG: Patient will score less than 5 on the Generalized Anxiety Disorder 7 Scale (GAD-7) Outcome: Progressing Goal: STG: Patient will participate in at least 80% of scheduled individual psychotherapy sessions Outcome: Progressing Intervention: Encourage patient to identify triggers

## 2021-04-26 ENCOUNTER — Other Ambulatory Visit: Payer: Self-pay | Admitting: Psychiatry

## 2021-05-29 ENCOUNTER — Other Ambulatory Visit: Payer: Self-pay

## 2021-05-29 ENCOUNTER — Telehealth: Payer: Self-pay | Admitting: Licensed Clinical Social Worker

## 2021-05-29 ENCOUNTER — Ambulatory Visit (INDEPENDENT_AMBULATORY_CARE_PROVIDER_SITE_OTHER): Payer: Self-pay | Admitting: Licensed Clinical Social Worker

## 2021-05-29 DIAGNOSIS — Z91199 Patient's noncompliance with other medical treatment and regimen due to unspecified reason: Secondary | ICD-10-CM

## 2021-05-29 NOTE — Telephone Encounter (Signed)
LCSW counselor tried to connect with patient for scheduled appointment via MyChart video text request x 2 and email request; also tried to connect via phone without success. LCSW counselor left message for patient to call office number to reschedule OPT appointment.  ? ?Attempt 1: Text and email: 3:05p ? ?Attempt 2: Text and email: 3:10p ? ?Attempt 3: phone call: 3:15p--left message ? ?Appt is No Show ?

## 2021-05-29 NOTE — Progress Notes (Signed)
LCSW counselor tried to connect with patient for scheduled appointment via MyChart video text request x 2 and email request; also tried to connect via phone without success. LCSW counselor left message for patient to call office number to reschedule OPT appointment.  ? ?Attempt 1: Text and email: 3:05p ? ?Attempt 2: Text and email: 3:10p ? ?Attempt 3: phone call: 3:15p--left message ? ?Appt is No Show ?

## 2021-06-04 ENCOUNTER — Telehealth (INDEPENDENT_AMBULATORY_CARE_PROVIDER_SITE_OTHER): Payer: Self-pay | Admitting: Psychiatry

## 2021-06-04 ENCOUNTER — Encounter: Payer: Self-pay | Admitting: Psychiatry

## 2021-06-04 ENCOUNTER — Other Ambulatory Visit: Payer: Self-pay

## 2021-06-04 DIAGNOSIS — F3178 Bipolar disorder, in full remission, most recent episode mixed: Secondary | ICD-10-CM

## 2021-06-04 NOTE — Progress Notes (Signed)
Attempted to connect with patient at the time of appointment , sent video link - when patient did not connect contacted by phone. ?Patient picked up phone and said she was asleep and that she had no charge on her phone and wanted to Reschedule this appointment. ?Communicated with staff . ?Patient advised to call back at her convenience to reschedule  appointment. ?

## 2021-06-07 ENCOUNTER — Telehealth: Payer: Medicare Other | Admitting: Psychiatry

## 2021-07-02 ENCOUNTER — Other Ambulatory Visit: Payer: Self-pay | Admitting: Infectious Diseases

## 2021-07-02 DIAGNOSIS — Z1231 Encounter for screening mammogram for malignant neoplasm of breast: Secondary | ICD-10-CM

## 2021-07-17 ENCOUNTER — Telehealth (INDEPENDENT_AMBULATORY_CARE_PROVIDER_SITE_OTHER): Payer: Medicare Other | Admitting: Psychiatry

## 2021-07-17 ENCOUNTER — Encounter: Payer: Self-pay | Admitting: Psychiatry

## 2021-07-17 DIAGNOSIS — F1421 Cocaine dependence, in remission: Secondary | ICD-10-CM | POA: Diagnosis not present

## 2021-07-17 DIAGNOSIS — F411 Generalized anxiety disorder: Secondary | ICD-10-CM | POA: Diagnosis not present

## 2021-07-17 DIAGNOSIS — G4701 Insomnia due to medical condition: Secondary | ICD-10-CM

## 2021-07-17 DIAGNOSIS — F3178 Bipolar disorder, in full remission, most recent episode mixed: Secondary | ICD-10-CM | POA: Diagnosis not present

## 2021-07-17 MED ORDER — OLANZAPINE 7.5 MG PO TABS: 7.5000 mg | ORAL_TABLET | Freq: Every day | ORAL | 2 refills | Status: DC

## 2021-07-17 MED ORDER — MIRTAZAPINE 7.5 MG PO TABS: 7.5000 mg | ORAL_TABLET | Freq: Every day | ORAL | 1 refills | Status: DC

## 2021-07-17 MED ORDER — OXCARBAZEPINE 600 MG PO TABS: 600.0000 mg | ORAL_TABLET | Freq: Every day | ORAL | 0 refills | Status: DC

## 2021-07-17 MED ORDER — HYDROXYZINE HCL 25 MG PO TABS: 25.0000 mg | ORAL_TABLET | Freq: Three times a day (TID) | ORAL | 1 refills | Status: DC

## 2021-07-17 NOTE — Progress Notes (Signed)
Virtual Visit via Video Note ? ?I connected with Jennifer Welch on 07/17/21 at  1:30 PM EDT by a video enabled telemedicine application and verified that I am speaking with the correct person using two identifiers. ? ?Location ?Provider Location : [facility] ?Patient Location : Home ? ?Participants: Patient , Provider ?  ?I discussed the limitations of evaluation and management by telemedicine and the availability of in person appointments. The patient expressed understanding and agreed to proceed. ? ?  ?I discussed the assessment and treatment plan with the patient. The patient was provided an opportunity to ask questions and all were answered. The patient agreed with the plan and demonstrated an understanding of the instructions. ?  ?The patient was advised to call back or seek an in-person evaluation if the symptoms worsen or if the condition fails to improve as anticipated. ? ? ?[place] MD OP Progress Note ? ?07/17/2021 2:51 PM ?Jennifer Welch  ?MRN:  [id number] ? ?Chief Complaint:  ?Chief Complaint  ?Patient presents with  ? Follow-up: 61 year old African-American female with worsening sleep problems presented for medication management.  ? ?HPI: Jennifer Welch is a 61 year old African-American female, married, lives in [place], has a history of bipolar disorder, GAD, cocaine use disorder in remission, gastroesophageal reflux disease, IBS, carpal tunnel syndrome was evaluated by telemedicine today. ? ?Patient today reports she is currently struggling with sleep problems.  She reports sleep is interrupted throughout the night.  The olanzapine does help her to sleep for a few hours.  However she is up and is unable to fall back asleep. ? ?Patient reports she is sad about the fact that she does not have transportation to get out at this time to do any activities outdoors.  She reports that makes her anxious.  However her husband is planning to get a car today and she looks forward to that. ? ?Patient continues to stay away  from any illicit drugs. ? ?Patient denies any suicidality, homicidality or perceptual disturbances. ? ?Reports she has cut back on drinking soda and has lost a few pounds. ? ?Does report chronic nausea, does have Phenergan as needed available. ? ?Denies any other concerns today. ? ?Visit Diagnosis:  ?  ICD-10-CM   ?1. Bipolar disorder, in full remission, most recent episode mixed (HCC)  F31.78 OLANZapine (ZYPREXA) 7.5 MG tablet  ?  ?2. GAD (generalized anxiety disorder)  F41.1 mirtazapine (REMERON) 7.5 MG tablet  ?  OLANZapine (ZYPREXA) 7.5 MG tablet  ?  hydrOXYzine (ATARAX) 25 MG tablet  ?  ?3. Insomnia due to medical condition  G47.01 mirtazapine (REMERON) 7.5 MG tablet  ?  hydrOXYzine (ATARAX) 25 MG tablet  ? mood  ?  ?4. Cocaine use disorder, moderate, in sustained remission (HCC)  F14.21 oxcarbazepine (TRILEPTAL) 600 MG tablet  ?  ? ? ?Past Psychiatric History: Reviewed past psychiatric history from progress note on 04/06/2019.  Past trials of medications ?Seroquel ?Trileptal ?Prozac ?Lexapro ?Xanax ?Klonopin ?Trazodone ? ?Past Medical History:  ?Past Medical History:  ?Diagnosis Date  ? Anxiety   ? Bipolar 1 disorder ([place])   ? Depression   ? Hypertensive disorder 02/09/2019  ? Neuromuscular disorder ([place])   ? CERVICAL DDD  ?  ?Past Surgical History:  ?Procedure Laterality Date  ? ABDOMINAL HYSTERECTOMY    ? BONE EXCISION Right 12/15/2018  ? Procedure: PARTIAL EXCISION BONE-PHALANX RIGHT WITH FLUOROSCOPY;  Surgeon: Caroline More, DPM;  Location: [place];  Service: Podiatry;  Laterality: Right;  MAC ANESTHESIA  ? BREAST SURGERY    ?  ECTOPIC PREGNANCY SURGERY    ? FOOT SURGERY    ? HAND TENDON SURGERY    ? KNEE SURGERY    ? LIPOMA EXCISION Left 09/25/2020  ? Procedure: EXCISION LIPOMAS;  Surgeon: Robert Bellow, MD;  Location: [facility];  Service: General;  Laterality: Left;  arm  ? OVARIAN CYST REMOVAL    ? ? ?Family Psychiatric History: Reviewed family psychiatric history from progress note on  04/06/2019. ? ?Family History:  ?Family History  ?Problem Relation Age of Onset  ? Diabetes Mother   ? Alcohol abuse Father   ? Colon cancer Father   ? Alcohol abuse Brother   ? Hypertension Brother   ? ? ?Social History: Reviewed social history from progress note on 04/06/2019. ?Social History  ? ?Socioeconomic History  ? Marital status: Married  ?  Spouse name: darwin Jamroz  ? Number of children: 3  ? Years of education: Not on file  ? Highest education level: Some college, no degree  ?Occupational History  ? Not on file  ?Tobacco Use  ? Smoking status: Former  ?  Types: Cigarettes  ? Smokeless tobacco: Never  ?Vaping Use  ? Vaping Use: Never used  ?Substance and Sexual Activity  ? Alcohol use: Yes  ?  Comment: OCCAS  ? Drug use: Never  ? Sexual activity: Not Currently  ?Other Topics Concern  ? Not on file  ?Social History Narrative  ? Not on file  ? ?Social Determinants of Health  ? ?Financial Resource Strain: Not on file  ?Food Insecurity: Not on file  ?Transportation Needs: Not on file  ?Physical Activity: Not on file  ?Stress: Not on file  ?Social Connections: Not on file  ? ? ?Allergies:  ?Allergies  ?Allergen Reactions  ? Phentermine Other (See Comments)  ?  Vivid dreams/DRUG INTERACTION NOT ALLERGIC ?Vivid dreams/DRUG INTERACTION NOT ALLERGIC ?  ? Quinolones Nausea Only and Other (See Comments)  ? Macrobid [Nitrofurantoin Macrocrystal] Rash  ? Septra [Sulfamethoxazole-Trimethoprim] Rash  ? ? ?Metabolic Disorder Labs: ?Lab Results  ?Component Value Date  ? HGBA1C 5.6 09/08/2019  ? MPG 114 09/08/2019  ? ?No results found for: PROLACTIN ?Lab Results  ?Component Value Date  ? CHOL 198 09/08/2019  ? TRIG 33 09/08/2019  ? HDL 88 09/08/2019  ? CHOLHDL 2.3 09/08/2019  ? VLDL 7 09/08/2019  ? LDLCALC 103 (H) 09/08/2019  ? ?Lab Results  ?Component Value Date  ? TSH 0.704 09/08/2019  ? ? ?Therapeutic Level Labs: ?No results found for: LITHIUM ?No results found for: VALPROATE ?No components found for:  CBMZ ? ?Current  Medications: ?Current Outpatient Medications  ?Medication Sig Dispense Refill  ? escitalopram (LEXAPRO) 10 MG tablet Take 1 tablet (10 mg total) by mouth daily. 90 tablet 0  ? gabapentin (NEURONTIN) 100 MG capsule Take 100 mg by mouth 3 (three) times daily.    ? mirtazapine (REMERON) 7.5 MG tablet Take 1 tablet (7.5 mg total) by mouth at bedtime. For sleep 30 tablet 1  ? naloxone (NARCAN) 4 MG/0.1ML LIQD nasal spray kit Place 1 spray into the nose once.    ? pantoprazole (PROTONIX) 40 MG tablet Take 40 mg by mouth 2 (two) times daily. Takes as needed    ? pregabalin (LYRICA) 100 MG capsule Take 100 mg by mouth 3 (three) times daily.    ? promethazine (PHENERGAN) 25 MG tablet Take 25 mg by mouth every 6 (six) hours as needed for nausea or vomiting.    ? tiZANidine (ZANAFLEX) 2  MG tablet Take 2 mg by mouth 3 (three) times daily.    ? HYDROcodone-acetaminophen (NORCO/VICODIN) 5-325 MG tablet Take 1 tablet by mouth every 4 (four) hours as needed for moderate pain. (Patient not taking: Reported on 07/17/2021) 10 tablet 0  ? hydrOXYzine (ATARAX) 25 MG tablet Take 1 tablet (25 mg total) by mouth 3 (three) times daily. 90 tablet 1  ? OLANZapine (ZYPREXA) 7.5 MG tablet Take 1 tablet (7.5 mg total) by mouth at bedtime. 30 tablet 2  ? oxcarbazepine (TRILEPTAL) 600 MG tablet Take 1 tablet (600 mg total) by mouth daily. 90 tablet 0  ? ?No current facility-administered medications for this visit.  ? ? ? ?Musculoskeletal: ?Strength & Muscle Tone:  UTA ?Gait & Station:  Seated ?Patient leans: N/A ? ?Psychiatric Specialty Exam: ?Review of Systems  ?Psychiatric/Behavioral:  Positive for sleep disturbance. The patient is nervous/anxious.   ?All other systems reviewed and are negative.  ?There were no vitals taken for this visit.There is no height or weight on file to calculate BMI.  ?General Appearance: Casual  ?Eye Contact:  Fair  ?Speech:  Clear and Coherent  ?Volume:  Normal  ?Mood:  Anxious coping okay  ?Affect:  Congruent  ?Thought  Process:  Goal Directed and Descriptions of Associations: Intact  ?Orientation:  Full (Time, Place, and Person)  ?Thought Content: Logical   ?Suicidal Thoughts:  No  ?Homicidal Thoughts:  No  ?Memory:  [place]

## 2021-08-16 ENCOUNTER — Telehealth: Payer: Medicare Other | Admitting: Psychiatry

## 2021-09-11 ENCOUNTER — Telehealth (INDEPENDENT_AMBULATORY_CARE_PROVIDER_SITE_OTHER): Payer: Medicare Other | Admitting: Psychiatry

## 2021-09-11 ENCOUNTER — Encounter: Payer: Self-pay | Admitting: Psychiatry

## 2021-09-11 DIAGNOSIS — F1421 Cocaine dependence, in remission: Secondary | ICD-10-CM

## 2021-09-11 DIAGNOSIS — G4701 Insomnia due to medical condition: Secondary | ICD-10-CM | POA: Diagnosis not present

## 2021-09-11 DIAGNOSIS — F411 Generalized anxiety disorder: Secondary | ICD-10-CM | POA: Diagnosis not present

## 2021-09-11 DIAGNOSIS — F3178 Bipolar disorder, in full remission, most recent episode mixed: Secondary | ICD-10-CM

## 2021-09-11 MED ORDER — OXCARBAZEPINE 600 MG PO TABS: 600.0000 mg | ORAL_TABLET | Freq: Every day | ORAL | 0 refills | Status: DC

## 2021-09-11 MED ORDER — HYDROXYZINE HCL 25 MG PO TABS: 25.0000 mg | ORAL_TABLET | Freq: Three times a day (TID) | ORAL | 1 refills | Status: DC

## 2021-09-11 MED ORDER — MIRTAZAPINE 7.5 MG PO TABS: 7.5000 mg | ORAL_TABLET | Freq: Every day | ORAL | 0 refills | Status: DC

## 2021-09-11 MED ORDER — ESCITALOPRAM OXALATE 10 MG PO TABS: 10.0000 mg | ORAL_TABLET | Freq: Every day | ORAL | 0 refills | Status: DC

## 2021-09-11 MED ORDER — OLANZAPINE 7.5 MG PO TABS: 7.5000 mg | ORAL_TABLET | Freq: Every day | ORAL | 0 refills | Status: DC

## 2021-09-11 NOTE — Progress Notes (Signed)
Virtual Visit via Telephone Note  I connected with Jennifer Welch on 09/11/21 at  1:40 PM EDT by telephone and verified that I am speaking with the correct person using two identifiers.  Location Provider Location : [facility] Patient Location : Car  Participants: Patient , Provider   I discussed the limitations, risks, security and privacy concerns of performing an evaluation and management service by telephone and the availability of in person appointments. I also discussed with the patient that there may be a patient responsible charge related to this service. The patient expressed understanding and agreed to proceed   I discussed the assessment and treatment plan with the patient. The patient was provided an opportunity to ask questions and all were answered. The patient agreed with the plan and demonstrated an understanding of the instructions.   The patient was advised to call back or seek an in-person evaluation if the symptoms worsen or if the condition fails to improve as anticipated.   [place] MD OP Progress Note  09/11/2021 2:04 PM Jennifer Welch  MRN:  [id number]  Chief Complaint:  Chief Complaint  Patient presents with   Follow-up: 61 year old African-American female with history of bipolar disorder, anxiety disorder, presented for medication management.   HPI: Jennifer Welch is a 61 year old African-American female, married, lives in [place], has a history of bipolar disorder, GAD, cocaine use disorder in remission, gastroesophageal reflux disease, IBS, carpal tunnel syndrome was evaluated by phone today.  Patient today reports she is tolerating the mirtazapine well.  Since being on the mirtazapine she has been sleeping better.  She continues to be on the olanzapine, continues to have increased appetite on the olanzapine.  However she has been watching her diet.  Patient denies any significant depression or anxiety symptoms.  Patient denies any suicidality, homicidality or  perceptual disturbances.  Reports she may have ran out of the Lexapro, did not have refills.  Would like to stay on this medication for now.  Denies any other concerns today.  Visit Diagnosis:    ICD-10-CM   1. Bipolar disorder, in full remission, most recent episode mixed (HCC)  F31.78 OLANZapine (ZYPREXA) 7.5 MG tablet    2. GAD (generalized anxiety disorder)  F41.1 mirtazapine (REMERON) 7.5 MG tablet    escitalopram (LEXAPRO) 10 MG tablet    hydrOXYzine (ATARAX) 25 MG tablet    OLANZapine (ZYPREXA) 7.5 MG tablet    3. Insomnia due to medical condition  G47.01 mirtazapine (REMERON) 7.5 MG tablet    hydrOXYzine (ATARAX) 25 MG tablet   mood    4. Cocaine use disorder, moderate, in sustained remission (HCC)  F14.21 oxcarbazepine (TRILEPTAL) 600 MG tablet      Past Psychiatric History: Reviewed past psychiatric history from progress note on 04/06/2019.  Past trials of medications-Seroquel, Trileptal, Prozac, Lexapro, Xanax, Klonopin, trazodone.  Past Medical History:  Past Medical History:  Diagnosis Date   Anxiety    Bipolar 1 disorder ([place])    Depression    Hypertensive disorder 02/09/2019   Neuromuscular disorder [facility])    CERVICAL DDD    Past Surgical History:  Procedure Laterality Date   ABDOMINAL HYSTERECTOMY     BONE EXCISION Right 12/15/2018   Procedure: PARTIAL EXCISION BONE-PHALANX RIGHT WITH FLUOROSCOPY;  Surgeon: Caroline More, DPM;  Location: [place];  Service: Podiatry;  Laterality: Right;  MAC ANESTHESIA   BREAST SURGERY     ECTOPIC PREGNANCY SURGERY     FOOT SURGERY     HAND TENDON SURGERY  KNEE SURGERY     LIPOMA EXCISION Left 09/25/2020   Procedure: EXCISION LIPOMAS;  Surgeon: Robert Bellow, MD;  Location: [facility];  Service: General;  Laterality: Left;  arm   OVARIAN CYST REMOVAL      Family Psychiatric History: Reviewed family psychiatric history from progress note on 04/06/2019.  Family History:  Family History  Problem Relation  Age of Onset   Diabetes Mother    Alcohol abuse Father    Colon cancer Father    Alcohol abuse Brother    Hypertension Brother     Social History: Reviewed social history from progress note on 04/06/2019. Social History   Socioeconomic History   Marital status: Married    Spouse name: darwin Koelzer   Number of children: 3   Years of education: Not on file   Highest education level: Some college, no degree  Occupational History   Not on file  Tobacco Use   Smoking status: Former    Types: Cigarettes   Smokeless tobacco: Never  Vaping Use   Vaping Use: Never used  Substance and Sexual Activity   Alcohol use: Yes    Comment: OCCAS   Drug use: Never   Sexual activity: Not Currently  Other Topics Concern   Not on file  Social History Narrative   Not on file   Social Determinants of Health   Financial Resource Strain: Not on file  Food Insecurity: Not on file  Transportation Needs: Not on file  Physical Activity: Not on file  Stress: Not on file  Social Connections: Not on file    Allergies:  Allergies  Allergen Reactions   Phentermine Other (See Comments)    Vivid dreams/DRUG INTERACTION NOT ALLERGIC Vivid dreams/DRUG INTERACTION NOT ALLERGIC    Quinolones Nausea Only and Other (See Comments)   Macrobid [Nitrofurantoin Macrocrystal] Rash   Septra [Sulfamethoxazole-Trimethoprim] Rash    Metabolic Disorder Labs: Lab Results  Component Value Date   HGBA1C 5.6 09/08/2019   MPG 114 09/08/2019   No results found for: "PROLACTIN" Lab Results  Component Value Date   CHOL 198 09/08/2019   TRIG 33 09/08/2019   HDL 88 09/08/2019   CHOLHDL 2.3 09/08/2019   VLDL 7 09/08/2019   LDLCALC 103 (H) 09/08/2019   Lab Results  Component Value Date   TSH 0.704 09/08/2019    Therapeutic Level Labs: No results found for: "LITHIUM" No results found for: "VALPROATE" No results found for: "CBMZ"  Current Medications: Current Outpatient Medications  Medication Sig  Dispense Refill   gabapentin (NEURONTIN) 100 MG capsule Take 100 mg by mouth 3 (three) times daily.     naloxone (NARCAN) 4 MG/0.1ML LIQD nasal spray kit Place 1 spray into the nose once.     pantoprazole (PROTONIX) 40 MG tablet Take 40 mg by mouth 2 (two) times daily. Takes as needed     pregabalin (LYRICA) 100 MG capsule Take 100 mg by mouth 3 (three) times daily.     promethazine (PHENERGAN) 25 MG tablet Take 25 mg by mouth every 6 (six) hours as needed for nausea or vomiting.     tiZANidine (ZANAFLEX) 2 MG tablet Take 2 mg by mouth 3 (three) times daily.     escitalopram (LEXAPRO) 10 MG tablet Take 1 tablet (10 mg total) by mouth daily. 90 tablet 0   hydrOXYzine (ATARAX) 25 MG tablet Take 1 tablet (25 mg total) by mouth 3 (three) times daily. 90 tablet 1   mirtazapine (REMERON) 7.5 MG tablet Take  1 tablet (7.5 mg total) by mouth at bedtime. For sleep 90 tablet 0   OLANZapine (ZYPREXA) 7.5 MG tablet Take 1 tablet (7.5 mg total) by mouth at bedtime. 90 tablet 0   oxcarbazepine (TRILEPTAL) 600 MG tablet Take 1 tablet (600 mg total) by mouth daily. 90 tablet 0   No current facility-administered medications for this visit.     Musculoskeletal: Strength & Muscle Tone:  UTA Gait & Station:  UTA Patient leans:  UTA  Psychiatric Specialty Exam: Review of Systems  Psychiatric/Behavioral: Negative.    All other systems reviewed and are negative.   There were no vitals taken for this visit.There is no height or weight on file to calculate BMI.  General Appearance:  UTA  Eye Contact:   UTA  Speech:  Clear and Coherent  Volume:  Normal  Mood:  Euthymic  Affect:   UTA  Thought Process:  Goal Directed and Descriptions of Associations: Intact  Orientation:  Full (Time, Place, and Person)  Thought Content: Logical   Suicidal Thoughts:  No  Homicidal Thoughts:  No  Memory:  Immediate;   Fair Recent;   Fair Remote;   Fair  Judgement:  Fair  Insight:  Fair  Psychomotor Activity:   UTA   Concentration:  Concentration: Fair and Attention Span: Fair  Recall:  [employer] of Knowledge: Fair  Language: Fair  Akathisia:  No  Handed:  Right  AIMS (if indicated): done  Assets:  Communication Skills Desire for Improvement Housing Social Support  ADL's:  Intact  Cognition: WNL  Sleep:  Fair   Screenings: AIMS    Flowsheet Row Video Visit from 09/11/2021 in [place] Video Visit from 07/17/2021 in [place] Video Visit from 12/06/2020 in [place] Total Score 0 0 3      GAD-7    Flowsheet Row Video Visit from 09/11/2021 in [place] Video Visit from 07/12/2020 in [place]  Total GAD-7 Score 0 7      PHQ2-9    Flowsheet Row Video Visit from 09/11/2021 in [place] Video Visit from 07/17/2021 in [place] Video Visit from 03/06/2021 in [place] from 09/06/2020 in [place] Video Visit from 07/12/2020 in [place]  PHQ-2 Total Score 0 0 _0 PHQ-9 Total Score -- -- -- 3 7      Flowsheet Row Video Visit from 09/11/2021 in [place] Video Visit from 07/17/2021 in [place] ED from 04/14/2021 in [place] No Risk No Risk No Risk        Assessment and Plan: Taite Rains is a 61 year old African-American female, married, on disability, lives in [place] with her husband, has a history of bipolar disorder, GAD, IBS, chronic back pain, multiple surgeries, cocaine use disorder in remission was evaluated by phone today.  Patient is currently improving, will benefit from following plan.  Plan Bipolar disorder in  remission Olanzapine 7.5 mg p.o. nightly Trileptal 600 mg p.o. daily Lexapro 10 mg p.o. daily  GAD-stable Lexapro 10 mg p.o. daily Continue CBT as needed  Insomnia-improving Mirtazapine 7.5 mg p.o. nightly  Cocaine use disorder in remission Will monitor closely  Follow-up in clinic in 2 to 3 months or sooner if needed.   I have spent at least 12 minutes non  face to face with patient today.     Consent: Patient/Guardian gives verbal consent for treatment and assignment of benefits for services provided during this visit. Patient/Guardian expressed understanding and agreed to proceed.   This note was generated in part or whole with voice recognition software. Voice recognition is usually quite accurate but there are transcription errors that can and very often do occur. I apologize for any typographical errors that were not detected and corrected.      Ursula Alert, MD 09/12/2021, 5:59 AM

## 2021-11-09 ENCOUNTER — Other Ambulatory Visit: Payer: Self-pay | Admitting: Psychiatry

## 2021-11-09 DIAGNOSIS — F411 Generalized anxiety disorder: Secondary | ICD-10-CM

## 2021-12-10 ENCOUNTER — Encounter: Payer: Self-pay | Admitting: Psychiatry

## 2021-12-10 ENCOUNTER — Telehealth (INDEPENDENT_AMBULATORY_CARE_PROVIDER_SITE_OTHER): Payer: Medicare Other | Admitting: Psychiatry

## 2021-12-10 DIAGNOSIS — G4701 Insomnia due to medical condition: Secondary | ICD-10-CM | POA: Diagnosis not present

## 2021-12-10 DIAGNOSIS — F1421 Cocaine dependence, in remission: Secondary | ICD-10-CM

## 2021-12-10 DIAGNOSIS — F411 Generalized anxiety disorder: Secondary | ICD-10-CM

## 2021-12-10 DIAGNOSIS — F3178 Bipolar disorder, in full remission, most recent episode mixed: Secondary | ICD-10-CM | POA: Diagnosis not present

## 2021-12-10 MED ORDER — OLANZAPINE 7.5 MG PO TABS: 7.5000 mg | ORAL_TABLET | Freq: Every day | ORAL | 0 refills | Status: DC

## 2021-12-10 MED ORDER — OXCARBAZEPINE 600 MG PO TABS: 600.0000 mg | ORAL_TABLET | Freq: Every day | ORAL | 0 refills | Status: DC

## 2021-12-10 MED ORDER — MIRTAZAPINE 7.5 MG PO TABS: 7.5000 mg | ORAL_TABLET | Freq: Every day | ORAL | 0 refills | Status: DC

## 2021-12-10 NOTE — Progress Notes (Signed)
Virtual Visit via Telephone Note  I connected with Jennifer Welch on 12/10/21 at  1:30 PM EDT by telephone and verified that I am speaking with the correct person using two identifiers.  Location Provider Location : [facility] Patient Location : Home  Participants: Patient , Provider    I discussed the limitations, risks, security and privacy concerns of performing an evaluation and management service by telephone and the availability of in person appointments. I also discussed with the patient that there may be a patient responsible charge related to this service. The patient expressed understanding and agreed to proceed.   I discussed the assessment and treatment plan with the patient. The patient was provided an opportunity to ask questions and all were answered. The patient agreed with the plan and demonstrated an understanding of the instructions.   The patient was advised to call back or seek an in-person evaluation if the symptoms worsen or if the condition fails to improve as anticipated.   [place] MD OP Progress Note  12/10/2021 1:46 PM Jennifer Welch  MRN:  [id number]  Chief Complaint:  Chief Complaint  Patient presents with   Follow-up   Depression   Anxiety   Medication Refill   HPI: Jennifer Welch is a 61 year old African-American female, married, lives in [place], has a history of bipolar disorder, GAD, cocaine use disorder in remission, gastroesophageal reflux disease, IBS, carpal tunnel syndrome was evaluated by phone today.  Patient today reports she continues to be in pain of her back and her shoulders.  She currently takes Lyrica, tizanidine.  However that does not seem to manage her pain well.  She is planning to follow-up with her primary care provider.  Patient reports overall mood symptoms are stable although the pain does affect her at times.  She reports sleep as good on the mirtazapine and olanzapine.  Patient denies any side effects to medications.  Patient  reports appetite as fair.  Denies any suicidality, homicidality or perceptual disturbances.  Continue to stay away from substances like cocaine.  Denies any other concerns today.  Visit Diagnosis:    ICD-10-CM   1. Bipolar disorder, in full remission, most recent episode mixed (HCC)  F31.78 OLANZapine (ZYPREXA) 7.5 MG tablet    2. GAD (generalized anxiety disorder)  F41.1 OLANZapine (ZYPREXA) 7.5 MG tablet    mirtazapine (REMERON) 7.5 MG tablet    3. Insomnia due to medical condition  G47.01    mood, back pain    4. Cocaine use disorder, moderate, in sustained remission (HCC)  F14.21 oxcarbazepine (TRILEPTAL) 600 MG tablet      Past Psychiatric History: Reviewed past psychiatric history from progress note on 04/06/2019.  Past trials of medications-Seroquel, Trileptal, Prozac, Lexapro, Xanax, Klonopin, trazodone.  Past Medical History:  Past Medical History:  Diagnosis Date   Anxiety    Bipolar 1 disorder ([place])    Depression    Hypertensive disorder 02/09/2019   Neuromuscular disorder [facility])    CERVICAL DDD    Past Surgical History:  Procedure Laterality Date   ABDOMINAL HYSTERECTOMY     BONE EXCISION Right 12/15/2018   Procedure: PARTIAL EXCISION BONE-PHALANX RIGHT WITH FLUOROSCOPY;  Surgeon: Caroline More, DPM;  Location: [place];  Service: Podiatry;  Laterality: Right;  MAC ANESTHESIA   BREAST SURGERY     ECTOPIC PREGNANCY SURGERY     FOOT SURGERY     HAND TENDON SURGERY     KNEE SURGERY     LIPOMA EXCISION Left 09/25/2020   Procedure:  EXCISION LIPOMAS;  Surgeon: Robert Bellow, MD;  Location: [facility];  Service: General;  Laterality: Left;  arm   OVARIAN CYST REMOVAL      Family Psychiatric History: Reviewed family psychiatric history from progress note on 04/06/2019.  Family History:  Family History  Problem Relation Age of Onset   Diabetes Mother    Alcohol abuse Father    Colon cancer Father    Alcohol abuse Brother    Hypertension Brother      Social History: Reviewed social history from progress note on 04/06/2019. Social History   Socioeconomic History   Marital status: Married    Spouse name: darwin Trudell   Number of children: 3   Years of education: Not on file   Highest education level: Some college, no degree  Occupational History   Not on file  Tobacco Use   Smoking status: Former    Types: Cigarettes   Smokeless tobacco: Never  Vaping Use   Vaping Use: Never used  Substance and Sexual Activity   Alcohol use: Yes    Comment: OCCAS   Drug use: Never   Sexual activity: Not Currently  Other Topics Concern   Not on file  Social History Narrative   Not on file   Social Determinants of Health   Financial Resource Strain: Not on file  Food Insecurity: Not on file  Transportation Needs: Not on file  Physical Activity: Not on file  Stress: Not on file  Social Connections: Not on file    Allergies:  Allergies  Allergen Reactions   Phentermine Other (See Comments)    Vivid dreams/DRUG INTERACTION NOT ALLERGIC Vivid dreams/DRUG INTERACTION NOT ALLERGIC    Quinolones Nausea Only and Other (See Comments)   Macrobid [Nitrofurantoin Macrocrystal] Rash   Septra [Sulfamethoxazole-Trimethoprim] Rash    Metabolic Disorder Labs: Lab Results  Component Value Date   HGBA1C 5.6 09/08/2019   MPG 114 09/08/2019   No results found for: "PROLACTIN" Lab Results  Component Value Date   CHOL 198 09/08/2019   TRIG 33 09/08/2019   HDL 88 09/08/2019   CHOLHDL 2.3 09/08/2019   VLDL 7 09/08/2019   LDLCALC 103 (H) 09/08/2019   Lab Results  Component Value Date   TSH 0.704 09/08/2019    Therapeutic Level Labs: No results found for: "LITHIUM" No results found for: "VALPROATE" No results found for: "CBMZ"  Current Medications: Current Outpatient Medications  Medication Sig Dispense Refill   escitalopram (LEXAPRO) 10 MG tablet Take 1 tablet by mouth once daily 90 tablet 0   gabapentin (NEURONTIN) 100 MG  capsule Take 100 mg by mouth 3 (three) times daily.     hydrOXYzine (ATARAX) 25 MG tablet Take 1 tablet (25 mg total) by mouth 3 (three) times daily. (Patient taking differently: Take 25 mg by mouth every 6 (six) hours as needed.) 90 tablet 1   naloxone (NARCAN) 4 MG/0.1ML LIQD nasal spray kit Place 1 spray into the nose once.     pregabalin (LYRICA) 100 MG capsule Take 100 mg by mouth 3 (three) times daily.     promethazine (PHENERGAN) 25 MG tablet Take 1 tablet by mouth every 6 (six) hours as needed.     tiZANidine (ZANAFLEX) 2 MG tablet Take 2 mg by mouth 3 (three) times daily.     mirtazapine (REMERON) 7.5 MG tablet Take 1 tablet (7.5 mg total) by mouth at bedtime. For sleep 90 tablet 0   OLANZapine (ZYPREXA) 7.5 MG tablet Take 1 tablet (7.5 mg  total) by mouth at bedtime. 90 tablet 0   oxcarbazepine (TRILEPTAL) 600 MG tablet Take 1 tablet (600 mg total) by mouth daily. 90 tablet 0   pantoprazole (PROTONIX) 40 MG tablet Take 40 mg by mouth 2 (two) times daily. Takes as needed (Patient not taking: Reported on 12/10/2021)     No current facility-administered medications for this visit.     Musculoskeletal: Strength & Muscle Tone:  UTA Gait & Station:  UTA Patient leans: N/A  Psychiatric Specialty Exam: Review of Systems  Musculoskeletal:  Positive for back pain.  Psychiatric/Behavioral:  The patient is nervous/anxious.   All other systems reviewed and are negative.   There were no vitals taken for this visit.There is no height or weight on file to calculate BMI.  General Appearance:  UTA  Eye Contact:   UTA  Speech:  Clear and Coherent  Volume:  Normal  Mood:  Anxious coping well, mostly due to her pain.  Affect:   UTA  Thought Process:  Goal Directed and Descriptions of Associations: Intact  Orientation:  Full (Time, Place, and Person)  Thought Content: Logical   Suicidal Thoughts:  No  Homicidal Thoughts:  No  Memory:  Immediate;   Fair Recent;   Fair Remote;   Fair   Judgement:  Fair  Insight:  Fair  Psychomotor Activity:   UTA  Concentration:  Concentration: Fair and Attention Span: Fair  Recall:  [employer] of Knowledge: Fair  Language: Fair  Akathisia:  No  Handed:  Right  AIMS (if indicated): not done  Assets:  Communication Skills Desire for Improvement Housing  ADL's:  Intact  Cognition: WNL  Sleep:  Fair   Screenings: AIMS    Flowsheet Row Video Visit from 09/11/2021 in [place] Video Visit from 07/17/2021 in [place] Video Visit from 12/06/2020 in [place] Total Score 0 0 3      GAD-7    Flowsheet Row Video Visit from 09/11/2021 in [place] Video Visit from 07/12/2020 in [place]  Total GAD-7 Score 0 7      PHQ2-9    Flowsheet Row Video Visit from 09/11/2021 in [place] Video Visit from 07/17/2021 in [place] Video Visit from 03/06/2021 in [place] from 09/06/2020 in [place] Video Visit from 07/12/2020 in [place]  PHQ-2 Total Score 0 0 _0 PHQ-9 Total Score -- -- -- 3 7      Flowsheet Row Video Visit from 12/10/2021 in [place] Video Visit from 09/11/2021 in [place] Video Visit from 07/17/2021 in [place] No Risk No Risk No Risk        Assessment and Plan: Jennifer Welch is a 61 year old African-American female, married, on disability, lives in [place] with her husband, has a history of bipolar disorder, GAD, IBS, chronic back pain, multiple surgeries, cocaine use disorder in remission was evaluated by phone today.  Patient is currently stable.  Plan Bipolar disorder in  remission Olanzapine 7.5 mg p.o. nightly. Trileptal 600 mg p.o. daily Lexapro 10 mg p.o. daily.  GAD-stable Lexapro 10 mg p.o. daily Continue CBT as needed  Insomnia-improving Mirtazapine 7.5 mg p.o. nightly  Cocaine use disorder intervention Will monitor closely  Patient may benefit from repeating hemoglobin A1c, lipid panel since she is on  medications like olanzapine, patient to follow-up with primary care provider.  Also reports she had labs done recently, agrees to sign an ROI to release them.  Follow-up in clinic in 3 to 4 months or sooner in person.   I have spent at least 12 minutes non face to face with patient today .  Consent: Patient/Guardian gives verbal consent for treatment and assignment of benefits for services provided during this visit. Patient/Guardian expressed understanding and agreed to proceed.   This note was generated in part or whole with voice recognition software. Voice recognition is usually quite accurate but there are transcription errors that can and very often do occur. I apologize for any typographical errors that were not detected and corrected.      Ursula Alert, MD 12/10/2021, 1:46 PM

## 2021-12-16 ENCOUNTER — Other Ambulatory Visit: Payer: Self-pay

## 2021-12-16 ENCOUNTER — Emergency Department
Admission: EM | Admit: 2021-12-16 | Discharge: 2021-12-16 | Disposition: A | Discharge status: Home / Self Care | Payer: Medicare Other | Attending: Emergency Medicine | Admitting: Emergency Medicine

## 2021-12-16 DIAGNOSIS — M5441 Lumbago with sciatica, right side: Secondary | ICD-10-CM | POA: Insufficient documentation

## 2021-12-16 DIAGNOSIS — M5431 Sciatica, right side: Secondary | ICD-10-CM

## 2021-12-16 DIAGNOSIS — M545 Low back pain, unspecified: Secondary | ICD-10-CM | POA: Diagnosis present

## 2021-12-16 MED ORDER — METHOCARBAMOL 500 MG PO TABS: 500.0000 mg | ORAL_TABLET | Freq: Four times a day (QID) | ORAL | 0 refills | Status: DC

## 2021-12-16 MED ORDER — PREDNISONE 10 MG (21) PO TBPK: ORAL_TABLET | ORAL | 0 refills | Status: DC

## 2021-12-16 MED ORDER — HYDROCODONE-ACETAMINOPHEN 5-325 MG PO TABS: 1.0000 | ORAL_TABLET | Freq: Four times a day (QID) | ORAL | 0 refills | Status: AC | PRN

## 2021-12-16 MED ORDER — OXYCODONE HCL 5 MG PO TABS
5.0000 mg | ORAL_TABLET | Freq: Once | ORAL | Status: AC
  Administered 2021-12-16: 5 mg via ORAL
  Filled 2021-12-16: qty 1

## 2021-12-16 NOTE — ED Triage Notes (Signed)
Pt to ED with husband for R & L lower back  pain and R leg pain after a mechanical fall (slipped on wet floor) 2 days ago. Pt landed on back and R side. Pt states has hx bone spurs and bulging discs. Pt walked into lobby, currently in wheelchair. Denies other recent falls. States R leg tends to give out on her when walking.

## 2021-12-16 NOTE — ED Provider Notes (Signed)
[facility] Provider Note    Event Date/Time   First MD Initiated Contact with Patient 12/16/21 1051     (approximate)   History   Back Pain and Leg Pain   HPI  Jennifer Welch is a 61 y.o. female with history of degenerative disc disease and chronic back pain presents to the emergency department for treatment and evaluation of right side lower back pain that goes into the right lower extremity.  Pain has been much worse over the past couple days.  No new injury.     Physical Exam   Triage Vital Signs: ED Triage Vitals  Enc Vitals Group     BP 12/16/21 1006 129/77     Pulse Rate 12/16/21 1006 84     Resp 12/16/21 1006 18     Temp 12/16/21 1006 99.1 F (37.3 C)     Temp Source 12/16/21 1006 Oral     SpO2 12/16/21 1006 95 %     Weight 12/16/21 1003 174 lb (78.9 kg)     Height 12/16/21 1003 '5\' 7"'$  (1.702 m)     Head Circumference --      Peak Flow --      Pain Score 12/16/21 1002 10     Pain Loc --      Pain Edu? --      Excl. in [place]? --     Most recent vital signs: Vitals:   12/16/21 1006  BP: 129/77  Pulse: 84  Resp: 18  Temp: 99.1 F (37.3 C)  SpO2: 95%     General: Awake, no distress.  CV:  Good peripheral perfusion.  Resp:  Normal effort.  Abd:  No distention.  Other:  No focal tenderness along the lumbar or sacral areas.   ED Results / Procedures / Treatments   Labs (all labs ordered are listed, but only abnormal results are displayed) Labs Reviewed - No data to display   EKG     RADIOLOGY Not indicated    PROCEDURES:  Critical Care performed: No  Procedures   MEDICATIONS ORDERED IN ED: Medications  oxyCODONE (Oxy IR/ROXICODONE) immediate release tablet 5 mg (5 mg Oral Given 12/16/21 1149)     IMPRESSION / MDM / ASSESSMENT AND PLAN / ED COURSE  I reviewed the triage vital signs and the nursing notes.                              Differential diagnosis includes, but is not limited to, acute on  chronic pain, sciatica, osteoarthritis  Patient's presentation is most consistent with acute, uncomplicated  61 year old female presenting to the emergency department for treatment and evaluation of low back pain that radiates into the right lower extremity.  See HPI for further details.  On exam, she has no weakness in the lower extremities.  She is able to bear weight and ambulate without assistance.  No red flags of back pain indicating need for imaging today.  Plan will be to treat her with medications on outpatient basis and have her follow-up with her primary care provider or neurology.  Patient is aware and agreeable to the plan.     FINAL CLINICAL IMPRESSION(S) / ED DIAGNOSES   Final diagnoses:  Sciatica of right side     Rx / DC Orders   ED Discharge Orders          Ordered    predniSONE (STERAPRED UNI-PAK 21 TAB)  10 MG (21) TBPK tablet        12/16/21 1150    methocarbamol (ROBAXIN) 500 MG tablet  4 times daily        12/16/21 1150    HYDROcodone-acetaminophen (NORCO/VICODIN) 5-325 MG tablet  Every 6 hours PRN        12/16/21 1150             Note:  This document was prepared using Dragon voice recognition software and may include unintentional dictation errors.   Victorino Dike, FNP 12/16/21 1253    Lucillie Garfinkel, MD 12/17/21 [phone]

## 2022-03-26 ENCOUNTER — Ambulatory Visit: Payer: Medicare Other | Admitting: Psychiatry

## 2022-04-17 ENCOUNTER — Encounter: Payer: Self-pay | Admitting: Psychiatry

## 2022-04-17 ENCOUNTER — Other Ambulatory Visit
Admission: RE | Admit: 2022-04-17 | Discharge: 2022-04-17 | Disposition: A | Discharge status: Home / Self Care | Payer: Medicare Other | Source: Ambulatory Visit | Attending: Psychiatry | Admitting: Psychiatry

## 2022-04-17 ENCOUNTER — Ambulatory Visit (INDEPENDENT_AMBULATORY_CARE_PROVIDER_SITE_OTHER): Payer: Medicare Other | Admitting: Psychiatry

## 2022-04-17 VITALS — BP 155/65 | HR 69

## 2022-04-17 DIAGNOSIS — F3178 Bipolar disorder, in full remission, most recent episode mixed: Secondary | ICD-10-CM | POA: Diagnosis present

## 2022-04-17 DIAGNOSIS — Z79899 Other long term (current) drug therapy: Secondary | ICD-10-CM

## 2022-04-17 DIAGNOSIS — F411 Generalized anxiety disorder: Secondary | ICD-10-CM | POA: Diagnosis present

## 2022-04-17 DIAGNOSIS — F1421 Cocaine dependence, in remission: Secondary | ICD-10-CM

## 2022-04-17 DIAGNOSIS — G4701 Insomnia due to medical condition: Secondary | ICD-10-CM

## 2022-04-17 LAB — LIPID PANEL
Cholesterol: 201 mg/dL — ABNORMAL HIGH (ref 0–200)
HDL: 64 mg/dL (ref >40)
LDL Cholesterol: 123 mg/dL — ABNORMAL HIGH (ref 0–99)
Total CHOL/HDL Ratio: 3.1 RATIO
Triglycerides: 70 mg/dL (ref <150)
VLDL: 14 mg/dL (ref 0–40)

## 2022-04-17 LAB — HEMOGLOBIN A1C
Hgb A1c MFr Bld: 5.4 % (ref 4.8–5.6)
Mean Plasma Glucose: 108.28 mg/dL

## 2022-04-17 LAB — HEPATIC FUNCTION PANEL
ALT: 9 U/L (ref 0–44)
AST: 13 U/L — ABNORMAL LOW (ref 15–41)
Albumin: 3.9 g/dL (ref 3.5–5.0)
Alkaline Phosphatase: 58 U/L (ref 38–126)
Bilirubin, Direct: <0.1 mg/dL (ref 0.0–0.2)
Total Bilirubin: 0.6 mg/dL (ref 0.3–1.2)
Total Protein: 6.8 g/dL (ref 6.5–8.1)

## 2022-04-17 LAB — TSH: TSH: 1.988 u[IU]/mL (ref 0.350–4.500)

## 2022-04-17 NOTE — Progress Notes (Signed)
[place] MD OP Progress Note  04/17/2022 12:45 PM Jennifer Welch  MRN:  [id number]  Chief Complaint:  Chief Complaint  Patient presents with   Follow-up   Medication Refill   Anxiety   Depression   HPI: Jennifer Welch is a 62 year old African-American female, married, lives in [place], has a history of bipolar disorder, GAD, cocaine use disorder in remission, gastroesophageal reflux disease, IBS, carpal tunnel syndrome was evaluated in office today.  Patient today reports she is currently doing much better.  She reports her home situation is peaceful right now.  Her granddaughter and her children who were living with her for the past few months have moved out.  She reports that has been extremely helpful since she feels much better with regards to her mood since then.  Patient continues to have chronic pain especially of her back, follows up with her providers for the same.  Does take tizanidine which helps to some extent.  Patient reports sleep continues to be a problem mostly because she does not have a good sleep hygiene.  Patient reports she falls asleep usually anywhere between midnight to 2 AM and then stays asleep till noon the next day.  She likes sleeping in and does not believe this is excessive sleep since she needs this.  Patient reports however she wants to stay on the olanzapine and mirtazapine at this dosage now and is not interested in any changes at this time.   Patient is compliant on her medications.  Denies side effects.  Patient agreeable to get labs done including metabolic panel.  Denies any suicidality, homicidality or perceptual disturbances.  Denies any other concerns today.  Visit Diagnosis:    ICD-10-CM   1. Bipolar disorder, in full remission, most recent episode mixed (HCC)  F31.78 Hepatic function panel    TSH    Lipid panel    Prolactin    Hemoglobin A1C    2. GAD (generalized anxiety disorder)  F41.1 Hepatic function panel    TSH    Lipid panel     Prolactin    Hemoglobin A1C    3. Insomnia due to medical condition  G47.01    mood, pain    4. Cocaine use disorder, moderate, in sustained remission (HCC)  F14.21     5. High risk medication use  Z79.899 Hepatic function panel    TSH    Lipid panel    Prolactin    Hemoglobin A1C      Past Psychiatric History: Reviewed past psychiatric history from progress note on 04/06/2019.  Past also medications-Seroquel, Trileptal, Prozac, Lexapro, Xanax, Klonopin, trazodone.  Past Medical History:  Past Medical History:  Diagnosis Date   Anxiety    Bipolar 1 disorder ([place])    Depression    Hypertensive disorder 02/09/2019   Neuromuscular disorder [facility])    CERVICAL DDD    Past Surgical History:  Procedure Laterality Date   ABDOMINAL HYSTERECTOMY     BONE EXCISION Right 12/15/2018   Procedure: PARTIAL EXCISION BONE-PHALANX RIGHT WITH FLUOROSCOPY;  Surgeon: Caroline More, DPM;  Location: [facility];  Service: Podiatry;  Laterality: Right;  MAC ANESTHESIA   BREAST SURGERY     ECTOPIC PREGNANCY SURGERY     FOOT SURGERY     HAND TENDON SURGERY     KNEE SURGERY     LIPOMA EXCISION Left 09/25/2020   Procedure: EXCISION LIPOMAS;  Surgeon: Robert Bellow, MD;  Location: [facility];  Service: General;  Laterality: Left;  arm  OVARIAN CYST REMOVAL      Family Psychiatric History: Reviewed family psychiatric history from progress note on 04/06/2019.  Family History:  Family History  Problem Relation Age of Onset   Diabetes Mother    Alcohol abuse Father    Colon cancer Father    Alcohol abuse Brother    Hypertension Brother     Social History: Reviewed social history from progress note on 04/06/2019. Social History   Socioeconomic History   Marital status: Married    Spouse name: Jennifer Welch   Number of children: 3   Years of education: Not on file   Highest education level: Some college, no degree  Occupational History   Not on file  Tobacco Use   Smoking status:  Former    Types: Cigarettes   Smokeless tobacco: Never  Vaping Use   Vaping Use: Never used  Substance and Sexual Activity   Alcohol use: Yes    Comment: OCCAS   Drug use: Never   Sexual activity: Not Currently  Other Topics Concern   Not on file  Social History Narrative   Not on file   Social Determinants of Health   Financial Resource Strain: Not on file  Food Insecurity: Not on file  Transportation Needs: Not on file  Physical Activity: Not on file  Stress: Not on file  Social Connections: Not on file    Allergies:  Allergies  Allergen Reactions   Phentermine Other (See Comments)    Vivid dreams/DRUG INTERACTION NOT ALLERGIC Vivid dreams/DRUG INTERACTION NOT ALLERGIC    Quinolones Nausea Only and Other (See Comments)   Macrobid [Nitrofurantoin Macrocrystal] Rash   Septra [Sulfamethoxazole-Trimethoprim] Rash    Metabolic Disorder Labs: Lab Results  Component Value Date   HGBA1C 5.4 04/17/2022   MPG 108.28 04/17/2022   MPG 114 09/08/2019   Lab Results  Component Value Date   PROLACTIN 12.7 04/17/2022   Lab Results  Component Value Date   CHOL 201 (H) 04/17/2022   TRIG 70 04/17/2022   HDL 64 04/17/2022   CHOLHDL 3.1 04/17/2022   VLDL 14 04/17/2022   LDLCALC 123 (H) 04/17/2022   LDLCALC 103 (H) 09/08/2019   Lab Results  Component Value Date   TSH 1.988 04/17/2022   TSH 0.704 09/08/2019    Therapeutic Level Labs: No results found for: "LITHIUM" No results found for: "VALPROATE" No results found for: "CBMZ"  Current Medications: Current Outpatient Medications  Medication Sig Dispense Refill   escitalopram (LEXAPRO) 10 MG tablet Take 1 tablet by mouth once daily 90 tablet 0   gabapentin (NEURONTIN) 100 MG capsule Take 100 mg by mouth 3 (three) times daily.     hydrOXYzine (ATARAX) 25 MG tablet Take 1 tablet (25 mg total) by mouth 3 (three) times daily. (Patient taking differently: Take 25 mg by mouth every 6 (six) hours as needed.) 90 tablet 1    mirtazapine (REMERON) 7.5 MG tablet Take 1 tablet (7.5 mg total) by mouth at bedtime. For sleep 90 tablet 0   naloxone (NARCAN) 4 MG/0.1ML LIQD nasal spray kit Place 1 spray into the nose once.     OLANZapine (ZYPREXA) 7.5 MG tablet Take 1 tablet (7.5 mg total) by mouth at bedtime. 90 tablet 0   oxcarbazepine (TRILEPTAL) 600 MG tablet Take 1 tablet (600 mg total) by mouth daily. 90 tablet 0   promethazine (PHENERGAN) 25 MG tablet Take 1 tablet by mouth every 6 (six) hours as needed.     tizanidine (ZANAFLEX) 2 MG  capsule Take 2 mg by mouth 3 (three) times daily as needed for muscle spasms.     pregabalin (LYRICA) 100 MG capsule Take 100 mg by mouth 3 (three) times daily. (Patient not taking: Reported on 04/17/2022)     No current facility-administered medications for this visit.     Musculoskeletal: Strength & Muscle Tone: within normal limits Gait & Station: normal Patient leans: N/A  Psychiatric Specialty Exam: Review of Systems  Musculoskeletal:  Positive for back pain (chronic).  Psychiatric/Behavioral: Negative.    All other systems reviewed and are negative.   Blood pressure (!) 155/65, pulse 69.There is no height or weight on file to calculate BMI.  General Appearance: Casual  Eye Contact:  Good  Speech:  Clear and Coherent  Volume:  Normal  Mood:  Euthymic  Affect:  Full Range  Thought Process:  Goal Directed and Descriptions of Associations: Intact  Orientation:  Full (Time, Place, and Person)  Thought Content: Logical   Suicidal Thoughts:  No  Homicidal Thoughts:  No  Memory:  Immediate;   Fair Recent;   Fair Remote;   Fair  Judgement:  Fair  Insight:  Fair  Psychomotor Activity:  Normal  Concentration:  Concentration: Fair and Attention Span: Fair  Recall:  [employer] of Knowledge: Fair  Language: Fair  Akathisia:  No  Handed:  Right  AIMS (if indicated): done  Assets:  Communication Skills Desire for [place] Talents/Skills Transportation  ADL's:  Intact  Cognition: WNL  Sleep:   restless, excessive at times    Screenings: [place] Office Visit from 04/17/2022 in [place] Video Visit from 09/11/2021 in [place] Video Visit from 07/17/2021 in [place] Video Visit from 12/06/2020 in [place] Total Score 0 0 0 3      [place] Office Visit from 04/17/2022 in [place] Video Visit from 09/11/2021 in [place] Video Visit from 07/12/2020 in [place]  Total GAD-7 Score 1 0 7      PHQ2-9    [place] Office Visit from 04/17/2022 in [place] Video Visit from 09/11/2021 in [place] Video Visit from 07/17/2021 in [place] Video Visit from 03/06/2021 in [place] Office Visit from 09/06/2020 in [place]  PHQ-2 Total Score 2 0 0 1 2  PHQ-9 Total Score 4 -- -- -- 3      [place] Office Visit from 04/17/2022 in [place] ED from 12/16/2021 in [facility] Emergency Department at [facility] Video Visit from 12/10/2021 in [place] No Risk No Risk No Risk        Assessment and Plan: Jennifer Welch is a 62 year old African-American female, married, on disability, lives in [place], has a history of bipolar disorder, GAD, IBS, chronic back pain was evaluated in office today.  Patient is currently stable except for sleep problems likely due  to lack of sleep hygiene.  Plan as noted below.  Plan Bipolar disorder in remission Olanzapine 7.5 mg p.o. nightly.  Long-term plan is to taper it off. Trileptal 600 mg p.o.  daily Lexapro 10 mg p.o. daily  GAD-stable Lexapro 10 mg p.o. daily Continue CBT as needed  Insomnia-unstable due to lack of sleep hygiene. Patient to work on sleep hygiene Continue mirtazapine 7.5 mg p.o. nightly  Cocaine use disorder in remission Will monitor closely  High risk medication use-will order labs-hepatic function, TSH, lipid panel, prolactin, hemoglobin A1c-patient to go to [facility] lab.  Labs ordered since patient is on multiple medications including olanzapine, Trileptal, Lexapro.   Follow-up in clinic in 3 months or sooner if needed.   This note was generated in part or whole with voice recognition software. Voice recognition is usually quite accurate but there are transcription errors that can and very often do occur. I apologize for any typographical errors that were not detected and corrected.     Ursula Alert, MD 04/18/2022, 9:07 AM

## 2022-04-18 LAB — PROLACTIN: Prolactin: 12.7 ng/mL (ref 3.6–25.2)

## 2022-05-08 ENCOUNTER — Telehealth: Payer: Self-pay | Admitting: Psychiatry

## 2022-05-08 DIAGNOSIS — F3178 Bipolar disorder, in full remission, most recent episode mixed: Secondary | ICD-10-CM

## 2022-05-08 DIAGNOSIS — G4701 Insomnia due to medical condition: Secondary | ICD-10-CM

## 2022-05-08 DIAGNOSIS — F411 Generalized anxiety disorder: Secondary | ICD-10-CM

## 2022-05-08 DIAGNOSIS — F1421 Cocaine dependence, in remission: Secondary | ICD-10-CM

## 2022-05-08 MED ORDER — HYDROXYZINE HCL 25 MG PO TABS: 25.0000 mg | ORAL_TABLET | Freq: Three times a day (TID) | ORAL | 2 refills | Status: DC

## 2022-05-08 MED ORDER — OLANZAPINE 7.5 MG PO TABS: 7.5000 mg | ORAL_TABLET | Freq: Every day | ORAL | 0 refills | Status: DC

## 2022-05-08 MED ORDER — OXCARBAZEPINE 600 MG PO TABS: 600.0000 mg | ORAL_TABLET | Freq: Every day | ORAL | 0 refills | Status: DC

## 2022-05-08 MED ORDER — MIRTAZAPINE 7.5 MG PO TABS: 7.5000 mg | ORAL_TABLET | Freq: Every day | ORAL | 0 refills | Status: DC

## 2022-05-08 MED ORDER — ESCITALOPRAM OXALATE 10 MG PO TABS: 10.0000 mg | ORAL_TABLET | Freq: Every day | ORAL | 0 refills | Status: DC

## 2022-05-08 NOTE — Telephone Encounter (Signed)
Patient left voice message 05-07-22 stating she was in for an appointment 04-17-22 and was to get a refill on her medication. Her refill has not been called in at this time, she states. She did not mention which medication she needed refilled.

## 2022-05-08 NOTE — Telephone Encounter (Signed)
I have sent all her medications prescribed by this provider-to her pharmacy at [facility], [place].

## 2022-07-16 ENCOUNTER — Ambulatory Visit: Payer: Medicare Other | Admitting: Psychiatry

## 2022-08-08 ENCOUNTER — Other Ambulatory Visit
Admission: RE | Admit: 2022-08-08 | Discharge: 2022-08-08 | Disposition: A | Discharge status: Home / Self Care | Payer: Medicare Other | Source: Ambulatory Visit | Attending: Sports Medicine | Admitting: Sports Medicine

## 2022-08-08 DIAGNOSIS — M65832 Other synovitis and tenosynovitis, left forearm: Secondary | ICD-10-CM | POA: Diagnosis present

## 2022-08-08 LAB — SYNOVIAL CELL COUNT + DIFF, W/ CRYSTALS
Crystals, Fluid: NONE SEEN
Eosinophils-Synovial: 0 %
Lymphocytes-Synovial Fld: 54 %
Monocyte-Macrophage-Synovial Fluid: 21 %
Neutrophil, Synovial: 25 %
WBC, Synovial: 211 /mm3 — ABNORMAL HIGH (ref 0–200)

## 2022-08-22 ENCOUNTER — Telehealth: Payer: Medicare Other | Admitting: Psychiatry

## 2022-08-29 ENCOUNTER — Telehealth: Payer: Self-pay

## 2022-08-29 NOTE — Telephone Encounter (Signed)
pt was notifed and strongly advised to go to the urgent care or to the ER. pt states that she will. because her feet is swollen up also.

## 2022-08-29 NOTE — Telephone Encounter (Signed)
pt called she had a couple things going on she stated that she relapse again on crack and she states she going to rehab on saturday. she also states that during that time she missed taking the oxcarbazepine and now she feels funny and her face feels numb. She would like to speak with you if possible

## 2022-08-29 NOTE — Telephone Encounter (Signed)
If she is having serious withdrawal symptoms , please advise her to go to urgent care . Its hard to assess by phone call.

## 2022-09-01 ENCOUNTER — Ambulatory Visit (HOSPITAL_COMMUNITY)
Admission: EM | Admit: 2022-09-01 | Discharge: 2022-09-01 | Disposition: A | Discharge status: Home / Self Care | Payer: Medicare Other | Attending: Behavioral Health | Admitting: Behavioral Health

## 2022-09-01 DIAGNOSIS — F319 Bipolar disorder, unspecified: Secondary | ICD-10-CM | POA: Diagnosis not present

## 2022-09-01 DIAGNOSIS — F1410 Cocaine abuse, uncomplicated: Secondary | ICD-10-CM | POA: Diagnosis not present

## 2022-09-01 DIAGNOSIS — F1010 Alcohol abuse, uncomplicated: Secondary | ICD-10-CM | POA: Diagnosis not present

## 2022-09-01 NOTE — Discharge Instructions (Addendum)
Discharge recommendations:   Medications: Patient is to take medications as prescribed. No medication changes were made during your visit today. The patient or patient's guardian is to contact a medical professional and/or outpatient provider to address any new side effects that develop. The patient or the patient's guardian should update outpatient providers of any new medications and/or medication changes.   Substance abuse follow up:  Please review list of outpatient resources for inpatient residential treatment options. Please follow up with your primary care provider for all medical related needs.   Therapy: We recommend that patient participate in individual therapy to address mental health concerns.  Atypical antipsychotics: If you are prescribed an atypical antipsychotic, it is recommended that your height, weight, BMI, blood pressure, fasting lipid panel, and fasting blood sugar be monitored by your outpatient providers.  Safety:   The following safety precautions should be taken:   No sharp objects. This includes scissors, razors, scrapers, and putty knives.   Chemicals should be removed and locked up.   Medications should be removed and locked up.   Weapons should be removed and locked up. This includes firearms, knives and instruments that can be used to cause injury.   The patient should abstain from use of illicit substances/drugs and abuse of any medications.  If symptoms worsen or do not continue to improve or if the patient becomes actively suicidal or homicidal then it is recommended that the patient return to the closest hospital emergency department, the [facility], or call 911 for further evaluation and treatment. National Suicide Prevention Lifeline 1-800-SUICIDE or 662-825-8149.  About 988 988 offers 24/7 access to trained crisis counselors who can help people experiencing mental health-related distress. People can call or text 988 or  chat 988lifeline.org for themselves or if they are worried about a loved one who may need crisis support.

## 2022-09-01 NOTE — Progress Notes (Signed)
   09/01/22 1638  [facility] Triage Screening (Walk-ins at [facility] only)  How Did You Hear About [place]? Family/Friend  What Is the Reason for Your Visit/Call Today? Pt presents to [facility] voluntarily accompanied by family members seeking substance use treatment. Pt reports sometimes she goes on binges where she uses cocaine. Pt reports her last use was 3 weeks ago. Pt denies use of alcohol or any other substances since her binge 3 weeks ago. Pt denies SI/HI and AVH.  How Long Has This Been Causing You Problems? <Week  Have You Recently Had Any Thoughts About Hurting Yourself? No  Are You Planning to Commit Suicide/Harm Yourself At This time? No  Have you Recently Had Thoughts About Hurting Someone Karolee Ohs? No  Are You Planning To Harm Someone At This Time? No  Are you currently experiencing any auditory, visual or other hallucinations? No  Have You Used Any Alcohol or Drugs in the Past 24 Hours? No  Do you have any current medical co-morbidities that require immediate attention? No  Clinician description of patient physical appearance/behavior: calm, cooperative  What Do You Feel Would Help You the Most Today? Alcohol or Drug Use Treatment  If access to [facility] was not available, would you have sought care in the Emergency Department? No  Determination of Need Routine (7 days)  Options For Referral Chemical Dependency Intensive Outpatient Therapy (CDIOP)

## 2022-09-01 NOTE — ED Provider Notes (Signed)
Behavioral Health Urgent Care Medical Screening Exam  Patient Name: Jennifer Welch MRN: [id number] Date of Evaluation: 09/01/22 Chief Complaint:  "seeking substance abuse treatment" Diagnosis:  Final diagnoses:  Cocaine abuse (HCC)    History of Present illness: Jennifer Welch is a 62 y.o. female patient with a past psychiatric history significant for bipolar mixed, anxiety, and polysubstance abuse (cocaine, alcohol) who presents to the [facility] behavioral health urgent care voluntary accompanied by her daughter with a chief complaint of seeking substance abuse treatment.  Patient seen and evaluated face-to-face by this provider, and chart reviewed. Per chart reviewed, patient has a psychiatric history significant for bipolar mixed, MDD, anxiety, and polysubstance abuse. Patient is followed by outpatient psychiatry at [facility] by Dr. Elna Breslow.   Patient states, "I have been binge using crack and alcohol for the past 3 years." She states that she last used crack and alcohol three weeks ago. She states that she would use crack and alcohol 4 days at a time and then stop for a couple of days and the cycle would repeat itself. She is unable to quantify how much crack she was using at that time. She reports that a 12-pack of beer would last 2 to 3 days. She denies experiencing alcohol withdrawal, seizures or DTs during that time. She is unable to identify triggers attributing to her substance use. However, she states that she left her husband 3 weeks ago and he was an addict too. She denies past substance abuse treatments. She reports a sobriety of 10 years between 1993-2003. She states that her goal for coming here today is to go to a place for support to stop using drugs. She states that she is interested in residential long-term substance abuse treatment.   On evaluation, patient is alert and oriented x 4. Her thought process is linear and goal oriented. Her speech is clear and coherent  at a moderate tone. Her mood is euthymic and affect is congruent. She has fair eye contact. She is calm and cooperative and does not appear to be in acute distress. She appears well groomed and is casually dressed. She denies SI/HI/AVH. There is no objective evidence that the patient is currently responding to internal or external stimuli.   Plan: Patient does not meet inpatient criteria for detox. Patient could benefit from residential substance abuse treatment for recovery. Patient provided with a list of inpatient residential long-term substance abuse treatment facilities. Patient agreeable to calling the list of facilities to check for bed availability, payor source and treatment options.    Flowsheet Row ED from 09/01/2022 in [facility] Office Visit from 04/17/2022 in confidential department ED from 12/16/2021 in [facility] Emergency Department at [facility]  C-SSRS RISK CATEGORY No Risk No Risk No Risk       Psychiatric Specialty Exam  Presentation  General Appearance:Appropriate for Environment  Eye Contact:Fair  Speech:Clear and Coherent  Speech Volume:Normal  Handedness:Right   Mood and Affect  Mood: Euthymic  Affect: Congruent   Thought Process  Thought Processes: Coherent; Goal Directed  Descriptions of Associations:Intact  Orientation:Full (Time, Place and Person)  Thought Content:Logical    Hallucinations:None  Ideas of Reference:None  Suicidal Thoughts:No  Homicidal Thoughts:No   Sensorium  Memory: Immediate Fair; Recent Fair; Remote Fair  Judgment: Fair  Insight: Fair   Art therapist  Concentration: Fair  Attention Span: Fair  Recall: Fair  Fund of Knowledge: Fair  Language:No data recorded  Psychomotor Activity  Psychomotor Activity:  Normal   Assets  Assets: Manufacturing systems engineer; Desire for Improvement; Housing; Health and safety inspector; Leisure Time; Physical Health;  Social Support; Resilience   Sleep  Sleep: Fair  Physical Exam: Physical Exam HENT:     Head: Normocephalic.     Nose: Nose normal.  Eyes:     Conjunctiva/sclera: Conjunctivae normal.  Cardiovascular:     Rate and Rhythm: Normal rate.  Pulmonary:     Effort: Pulmonary effort is normal.  Musculoskeletal:        General: Normal range of motion.     Cervical back: Normal range of motion.  Neurological:     Mental Status: She is alert and oriented to person, place, and time.    Review of Systems  Constitutional: Negative.   HENT: Negative.    Eyes: Negative.   Respiratory: Negative.    Cardiovascular: Negative.   Gastrointestinal: Negative.   Genitourinary: Negative.   Musculoskeletal: Negative.   Neurological: Negative.   Endo/Heme/Allergies: Negative.   Psychiatric/Behavioral:  Positive for substance abuse.    Blood pressure (!) 155/86, pulse 79, temperature 98.2 F (36.8 C), temperature source Oral, resp. rate 18, SpO2 97 %. There is no height or weight on file to calculate BMI.  Musculoskeletal: Strength & Muscle Tone: within normal limits Gait & Station: normal Patient leans: N/A   [facility] MSE Discharge Disposition for Follow up and Recommendations: Based on my evaluation the patient does not appear to have an emergency medical condition and can be discharged with resources and follow up care in outpatient services for substance abuse treatment  Discharge recommendations:   Medications: Patient is to take medications as prescribed. No medication changes were made during your visit today. The patient or patient's guardian is to contact a medical professional and/or outpatient provider to address any new side effects that develop. The patient or the patient's guardian should update outpatient providers of any new medications and/or medication changes.   Substance abuse follow up:  Please review list of outpatient resources for inpatient residential treatment options.  Please follow up with your primary care provider for all medical related needs.   Therapy: We recommend that patient participate in individual therapy to address mental health concerns.  Atypical antipsychotics: If you are prescribed an atypical antipsychotic, it is recommended that your height, weight, BMI, blood pressure, fasting lipid panel, and fasting blood sugar be monitored by your outpatient providers.  Safety:   The following safety precautions should be taken:   No sharp objects. This includes scissors, razors, scrapers, and putty knives.   Chemicals should be removed and locked up.   Medications should be removed and locked up.   Weapons should be removed and locked up. This includes firearms, knives and instruments that can be used to cause injury.   The patient should abstain from use of illicit substances/drugs and abuse of any medications.  If symptoms worsen or do not continue to improve or if the patient becomes actively suicidal or homicidal then it is recommended that the patient return to the closest hospital emergency department, the [facility], or call 911 for further evaluation and treatment. National Suicide Prevention Lifeline 1-800-SUICIDE or 260-478-2136.  About 988 988 offers 24/7 access to trained crisis counselors who can help people experiencing mental health-related distress. People can call or text 988 or chat 988lifeline.org for themselves or if they are worried about a loved one who may need crisis support.   Bentli Llorente L, NP 09/01/2022, 5:00 PM

## 2022-10-07 ENCOUNTER — Telehealth: Payer: Medicare Other | Admitting: Psychiatry

## 2022-10-21 ENCOUNTER — Telehealth: Payer: Self-pay

## 2022-10-21 NOTE — Telephone Encounter (Signed)
pt called left a message that she needed a call back. no other info was given. pt last seen on 1-31 next appt 8-22

## 2022-10-21 NOTE — Telephone Encounter (Signed)
left message for patient to call our office back for more infomation

## 2022-11-07 ENCOUNTER — Encounter: Payer: Self-pay | Admitting: Psychiatry

## 2022-11-07 ENCOUNTER — Telehealth: Payer: Medicare Other | Admitting: Psychiatry

## 2022-11-07 DIAGNOSIS — G4701 Insomnia due to medical condition: Secondary | ICD-10-CM | POA: Diagnosis not present

## 2022-11-07 DIAGNOSIS — F1421 Cocaine dependence, in remission: Secondary | ICD-10-CM

## 2022-11-07 DIAGNOSIS — F3178 Bipolar disorder, in full remission, most recent episode mixed: Secondary | ICD-10-CM

## 2022-11-07 DIAGNOSIS — F411 Generalized anxiety disorder: Secondary | ICD-10-CM

## 2022-11-07 DIAGNOSIS — Z79899 Other long term (current) drug therapy: Secondary | ICD-10-CM

## 2022-11-07 MED ORDER — OLANZAPINE 2.5 MG PO TABS: 2.5000 mg | ORAL_TABLET | Freq: Every day | ORAL | 0 refills | Status: DC

## 2022-11-07 MED ORDER — TRAZODONE HCL 50 MG PO TABS: 50.0000 mg | ORAL_TABLET | Freq: Every day | ORAL | 0 refills | Status: DC

## 2022-11-07 MED ORDER — ESCITALOPRAM OXALATE 10 MG PO TABS: 10.0000 mg | ORAL_TABLET | Freq: Every day | ORAL | 0 refills | Status: DC

## 2022-11-07 NOTE — Progress Notes (Signed)
Virtual Visit via Video Note  I connected with [name] on 11/07/22 at  4:00 PM EDT by a video enabled telemedicine application and verified that I am speaking with the correct person using two identifiers.  Location Provider Location : [facility] Patient Location : Home  Participants: Patient , Provider   I discussed the limitations of evaluation and management by telemedicine and the availability of in person appointments. The patient expressed understanding and agreed to proceed.    I discussed the assessment and treatment plan with the patient. The patient was provided an opportunity to ask questions and all were answered. The patient agreed with the plan and demonstrated an understanding of the instructions.   The patient was advised to call back or seek an in-person evaluation if the symptoms worsen or if the condition fails to improve as anticipated.   BH MD OP Progress Note  11/07/2022 5:28 PM Jennifer Welch  MRN:  [id number]  Chief Complaint:  Chief Complaint  Patient presents with   Follow-up   Anxiety   Depression   Medication Refill   HPI: Jennifer Welch is a 62 year old African-American female, married, lives in [place], has a history of bipolar disorder, GAD, cocaine use disorder in remission, gastroesophageal reflux disease, IBS, carpal tunnel syndrome was evaluated by telemedicine today.  Patient today reports she relapsed on cocaine and decided to separate from her husband.  Patient reports she tried so hard to stay away from cocaine however since her husband continues to go back to using, it made it difficult for her and she hence went on binging episodes.  Patient reports she went to the [facility] in [place] however was told they could not admit her.  Patient reports she is currently staying with her daughter in [place].  She reports this living situation is working out well for her and she is currently sober since the past 90 days.  She also has a  sponsor.  Patient reports she does have mood swings, anxiety, sadness mostly because of the fact that she is separated from her spouse.  She reports her spouse continues to call her and she had to block him on her phone and Facebook.  She however is worried that he might overdose accidentally since he continues to use.  Patient reports she stopped taking her Trileptal due to side effects of rash.  She reports she also stopped the mirtazapine since she felt her appetite was stimulated by it.  She ran out of her Lexapro however would like to go back on it.  She would like to be tapered off of the olanzapine since she does not want to be on any medication that can cause weight gain or increased appetite.  Patient does struggle with sleep.  She reports she has difficulty falling asleep and sleep is interrupted.  She is agreeable to a trial of trazodone.  Patient currently denies any suicidality, homicidality or perceptual disturbances.  Patient reports she was able to find a provider and therapist in [place].  She has upcoming appointment with them.  Since this will be her last appointment with this provider.  She agrees to request medical records.  Patient denies any other concerns today.  Visit Diagnosis:    ICD-10-CM   1. Bipolar disorder, in full remission, most recent episode mixed (HCC)  F31.78 OLANZapine (ZYPREXA) 2.5 MG tablet    traZODone (DESYREL) 50 MG tablet    2. GAD (generalized anxiety disorder)  F41.1 traZODone (DESYREL) 50 MG tablet  escitalopram (LEXAPRO) 10 MG tablet    3. Insomnia due to medical condition  G47.01 OLANZapine (ZYPREXA) 2.5 MG tablet    traZODone (DESYREL) 50 MG tablet   mood, pain    4. Cocaine use disorder, moderate, in early remission (HCC)  F14.21    Recent relapse, currently sober but reports since the past 90 days    5. High risk medication use  Z79.899       Past Psychiatric History: I have reviewed past psychiatric history from progress note on  04/06/2019.  Past trials of medications like Seroquel, Trileptal, Prozac, Lexapro, Xanax, Klonopin, trazodone.  Past Medical History:  Past Medical History:  Diagnosis Date   Anxiety    Bipolar 1 disorder (HCC)    Depression    Hypertensive disorder 02/09/2019   Neuromuscular disorder [facility])    CERVICAL DDD    Past Surgical History:  Procedure Laterality Date   ABDOMINAL HYSTERECTOMY     BONE EXCISION Right 12/15/2018   Procedure: PARTIAL EXCISION BONE-PHALANX RIGHT WITH FLUOROSCOPY;  Surgeon: Rosetta Posner, DPM;  Location: [facility];  Service: Podiatry;  Laterality: Right;  MAC ANESTHESIA   BREAST SURGERY     ECTOPIC PREGNANCY SURGERY     FOOT SURGERY     HAND TENDON SURGERY     KNEE SURGERY     LIPOMA EXCISION Left 09/25/2020   Procedure: EXCISION LIPOMAS;  Surgeon: Earline Mayotte, MD;  Location: [facility];  Service: General;  Laterality: Left;  arm   OVARIAN CYST REMOVAL      Family Psychiatric History: Reviewed family psychiatric history from progress note on 04/06/2019.  Family History:  Family History  Problem Relation Age of Onset   Diabetes Mother    Alcohol abuse Father    Colon cancer Father    Alcohol abuse Brother    Hypertension Brother     Social History: Reviewed social history from progress note on 04/06/2019. Social History   Socioeconomic History   Marital status: Married    Spouse name: Jennifer Welch   Number of children: 3   Years of education: Not on file   Highest education level: Some college, no degree  Occupational History   Not on file  Tobacco Use   Smoking status: Former    Types: Cigarettes   Smokeless tobacco: Never  Vaping Use   Vaping status: Never Used  Substance and Sexual Activity   Alcohol use: Yes    Comment: OCCAS   Drug use: Never   Sexual activity: Not Currently  Other Topics Concern   Not on file  Social History Narrative   Not on file   Social Determinants of Health   Financial Resource Strain: Low  Risk  (07/02/2022)   Received from [facility], [facility]   Overall Financial Resource Strain (CARDIA)    Difficulty of Paying Living Expenses: Not hard at all  Food Insecurity: No Food Insecurity (07/02/2022)   Received from [facility], [facility]   Hunger Vital Sign    Worried About Running Out of Food in the Last Year: Never true    Ran Out of Food in the Last Year: Never true  Transportation Needs: No Transportation Needs (07/02/2022)   Received from [facility], [facility]   PRAPARE - Transportation    In the past 12 months, has lack of transportation kept you from medical appointments or from getting medications?: No  Lack of Transportation (Non-Medical): No  Physical Activity: Not on file  Stress: Not on file  Social Connections: Not on file    Allergies:  Allergies  Allergen Reactions   Phentermine Other (See Comments)    Vivid dreams/DRUG INTERACTION NOT ALLERGIC Vivid dreams/DRUG INTERACTION NOT ALLERGIC    Quinolones Nausea Only and Other (See Comments)   Macrobid [Nitrofurantoin Macrocrystal] Rash   Septra [Sulfamethoxazole-Trimethoprim] Rash    Metabolic Disorder Labs: Lab Results  Component Value Date   HGBA1C 5.4 04/17/2022   MPG 108.28 04/17/2022   MPG 114 09/08/2019   Lab Results  Component Value Date   PROLACTIN 12.7 04/17/2022   Lab Results  Component Value Date   CHOL 201 (H) 04/17/2022   TRIG 70 04/17/2022   HDL 64 04/17/2022   CHOLHDL 3.1 04/17/2022   VLDL 14 04/17/2022   LDLCALC 123 (H) 04/17/2022   LDLCALC 103 (H) 09/08/2019   Lab Results  Component Value Date   TSH 1.988 04/17/2022   TSH 0.704 09/08/2019    Therapeutic Level Labs: No results found for: "LITHIUM" No results found for: "VALPROATE" No results found for: "CBMZ"  Current Medications: Current Outpatient Medications  Medication Sig Dispense Refill    gabapentin (NEURONTIN) 100 MG capsule Take 100 mg by mouth 3 (three) times daily.     hydrOXYzine (ATARAX) 25 MG tablet Take 1 tablet (25 mg total) by mouth 3 (three) times daily. 90 tablet 2   OLANZapine (ZYPREXA) 2.5 MG tablet Take 1 tablet (2.5 mg total) by mouth at bedtime. 30 tablet 0   promethazine (PHENERGAN) 25 MG tablet Take 1 tablet by mouth every 6 (six) hours as needed.     traZODone (DESYREL) 50 MG tablet Take 1 tablet (50 mg total) by mouth at bedtime. 30 tablet 0   escitalopram (LEXAPRO) 10 MG tablet Take 1 tablet (10 mg total) by mouth daily. 90 tablet 0   naloxone (NARCAN) 4 MG/0.1ML LIQD nasal spray kit Place 1 spray into the nose once.     No current facility-administered medications for this visit.     Musculoskeletal: Strength & Muscle Tone:  UTA Gait & Station:  Seated Patient leans: N/A  Psychiatric Specialty Exam: Review of Systems  Psychiatric/Behavioral:  Positive for sleep disturbance. The patient is nervous/anxious.     There were no vitals taken for this visit.There is no height or weight on file to calculate BMI.  General Appearance: Fairly Groomed  Eye Contact:  Fair  Speech:  Clear and Coherent  Volume:  Normal  Mood:  Anxious  Affect:  Congruent  Thought Process:  Goal Directed and Descriptions of Associations: Intact  Orientation:  Full (Time, Place, and Person)  Thought Content: Logical   Suicidal Thoughts:  No  Homicidal Thoughts:  No  Memory:  Immediate;   Fair Recent;   Fair Remote;   Fair  Judgement:  Fair  Insight:  Fair  Psychomotor Activity:  Normal  Concentration:  Concentration: Fair and Attention Span: Fair  Recall:  [employer] of Knowledge: Fair  Language: Fair  Akathisia:  No  Handed:  Right  AIMS (if indicated): not done  Assets:  Communication Skills Desire for Improvement Housing Social Support  ADL's:  Intact  Cognition: WNL  Sleep:  Poor   Screenings: AIMS    Flowsheet Row Office Visit from 04/17/2022 in [facility] Video Visit from 09/11/2021 in [facility] Video Visit from 07/17/2021 in [facility] Video Visit from 12/06/2020 in [facility]  AIMS Total Score 0 0 0 3      GAD-7    Flowsheet Row Office Visit from 04/17/2022 in [facility] Video Visit from 09/11/2021 in [facility] Video Visit from 07/12/2020 in [facility]  Total GAD-7 Score 1 0 7      PHQ2-9    Flowsheet Row Office Visit from 04/17/2022 in [facility] Video Visit from 09/11/2021 in [facility] Video Visit from 07/17/2021 in [facility] Video Visit from 03/06/2021 in [facility] Office Visit from 09/06/2020 in [facility]  PHQ-2 Total Score 2 0 0 1 2  PHQ-9 Total Score 4 -- -- -- 3      Flowsheet Row Video Visit from 11/07/2022 in [facility] ED from 09/01/2022 in [facility] Office Visit from 04/17/2022 in [facility]  C-SSRS RISK CATEGORY No Risk No Risk No Risk        Assessment and Plan: Rosie Reif is a 62 year old African-American female, married, on disability, lives in [place], has a history of bipolar disorder, GAD, IBS, chronic back pain was evaluated by telemedicine today.  Patient with recent relapse on cocaine currently with multiple situational stressors including separation from spouse, continues to have anxiety symptoms as well as side effects to medications like olanzapine, mirtazapine, will benefit from the following  plan.  Plan Bipolar disorder type I most recent mixed, in remission Will taper off olanzapine, reduce olanzapine to 2.5 mg p.o. nightly.  Patient has upcoming appointment with her new provider and agrees to discuss further tapering with them. Patient will need to be started on a new mood stabilizer however patient to discuss with new provider. Restart Lexapro 10 mg p.o. daily  GAD-unstable Restart Lexapro 10 mg p.o. daily. Patient will need  psychotherapy, reports she has upcoming appointment with the therapist as well.  Insomnia-unstable Discontinue mirtazapine due to side effects and noncompliance. Start trazodone 50 mg p.o. nightly as needed Patient to work on sleep hygiene techniques.  Cocaine use disorder in early remission-moderate Patient reports she is currently sober since the past 90 days.  Patient is planning to transfer her care to a new provider since she currently lives in [place].  Patient to continue to work with them.  High risk medication use-reviewed and discussed labs-CMP-08/08/2022-within normal limits, CBC with differential-within normal limits except for MPV low at 9.3, TSH-04/17/2022-within normal limits, lipid panel-cholesterol elevated at 201, LDL cholesterol elevated at 123 otherwise within normal limits, prolactin level 12.7, hemoglobin A1c-5.4.    Collaboration of Care: Collaboration of Care: Other patient to sign an ROI to request medical records for transition of care.  Patient has upcoming appointment with [facility] in [place]  Patient/Guardian was advised Release of Information must be obtained prior to any record release in order to collaborate their care with an outside provider. Patient/Guardian was advised if they have not already done so to contact the registration department to sign all necessary forms in order for [place] to release information regarding their care.   Consent: Patient/Guardian gives verbal consent for treatment and assignment  of benefits for services provided during this visit. Patient/Guardian expressed understanding and agreed to proceed.  Follow-up in clinic as needed.  This note was generated in part or whole with voice recognition software. Voice recognition is usually quite accurate but there are transcription errors that can and very often do occur. I apologize for any typographical errors that were not detected and corrected.    Jomarie Longs, MD 11/08/2022, 9:17 AM

## 2022-11-25 ENCOUNTER — Telehealth: Payer: Self-pay | Admitting: Psychiatry

## 2022-11-25 NOTE — Telephone Encounter (Signed)
Yes, I can give , but she will have to let us know when she is gets close to needing it , its too soon now.

## 2022-11-25 NOTE — Telephone Encounter (Signed)
Patient does not have an appointment with new psychiatrist until October. The precriptions you provided was for 1 month and she will run out before the appointment. Will you fill again once she gets down to 7 days left till she can get to the new psychiatrist in [facility]. 11?

## 2023-01-06 ENCOUNTER — Telehealth (HOSPITAL_COMMUNITY): Payer: Self-pay | Admitting: Psychiatry

## 2023-01-06 NOTE — Telephone Encounter (Signed)
I was not aware of transfer to Dr.Akhtar either. Patient last visit told me she wanted to stop coming to see me since she was planning to find a provider close to [place].  If she is staying with me , it will not be all VIRTUAL . Please let patient aware.

## 2023-01-06 NOTE — Telephone Encounter (Signed)
I have spoke with patient. She would like to set up an appointment with Dr. Elna Breslow virtually.   Please call patient to make a follow up appointment.  Thank You.

## 2023-01-06 NOTE — Telephone Encounter (Signed)
This patient is calling to set up an appointment with Dr. Gilmore Laroche. She now lives in [place]. She moved from [place]. She was seeing Dr. Elna Breslow at [facility].   Would you be ok for transfer this new patient?

## 2023-02-11 ENCOUNTER — Encounter: Payer: Self-pay | Admitting: Psychiatry

## 2023-02-11 ENCOUNTER — Ambulatory Visit: Payer: Medicare Other | Admitting: Psychiatry

## 2023-02-11 VITALS — BP 150/77 | HR 79 | Temp 98.3°F | Ht 67.0 in | Wt 209.2 lb

## 2023-02-11 DIAGNOSIS — F1421 Cocaine dependence, in remission: Secondary | ICD-10-CM | POA: Diagnosis not present

## 2023-02-11 DIAGNOSIS — F3178 Bipolar disorder, in full remission, most recent episode mixed: Secondary | ICD-10-CM

## 2023-02-11 DIAGNOSIS — G4701 Insomnia due to medical condition: Secondary | ICD-10-CM | POA: Diagnosis not present

## 2023-02-11 DIAGNOSIS — F411 Generalized anxiety disorder: Secondary | ICD-10-CM | POA: Diagnosis not present

## 2023-02-11 MED ORDER — OLANZAPINE 2.5 MG PO TABS: 2.5000 mg | ORAL_TABLET | Freq: Every day | ORAL | 0 refills | Status: DC

## 2023-02-11 MED ORDER — HYDROXYZINE HCL 25 MG PO TABS: 25.0000 mg | ORAL_TABLET | Freq: Three times a day (TID) | ORAL | 2 refills | Status: DC

## 2023-02-11 MED ORDER — TRAZODONE HCL 50 MG PO TABS: 75.0000 mg | ORAL_TABLET | Freq: Every day | ORAL | 1 refills | Status: DC

## 2023-02-11 MED ORDER — ESCITALOPRAM OXALATE 20 MG PO TABS: 20.0000 mg | ORAL_TABLET | Freq: Every day | ORAL | 1 refills | Status: DC

## 2023-02-11 NOTE — Progress Notes (Unsigned)
BH MD OP Progress Note  02/11/2023 5:05 PM Jennifer Welch  MRN:  [id number]  Chief Complaint:  Chief Complaint  Patient presents with   Follow-up   Anxiety   Depression   Medication Refill   HPI: Jennifer Welch is a 62 year old African-American female, separated, lives in [place], has a history of bipolar disorder, GAD, cocaine use disorder in remission, gastroesophageal reflux disease, IBS, carpal tunnel syndrome was evaluated in office today.   She presented for a follow-up visit after a significant life change involving a separation from her spouse of many years. The patient reported experiencing grief and emotional distress related to the separation, but denied any manic or depressive episodes, hallucinations, paranoia, or suicidal ideation. She also reported difficulty sleeping, a long-standing issue that has persisted despite the use of trazodone 50 mg and melatonin.  The patient recently moved to [place], [place] to assist her daughter with childcare and has secured part-time employment at a [employer]. She reported enjoying her work, but noted that the late-night shifts have further complicated her sleep patterns.  In terms of physical health, the patient reported chronic back pain due to multiple bulging discs and bone spurs. She is currently managing her pain with gabapentin, but expressed reluctance to undergo recommended surgery due to a history of multiple surgical procedures.  The patient also reported a recent change in her medication regimen, specifically an increase in her gabapentin dosage from 100mg  to 300mg  three times daily. She also reported taking Lexapro for her bipolar disorder, and olanzapine, which she was hoping to be weaned off.  Patient continues to stay away from cocaine.  That is one of the reasons she moved away from her husband who continues to use.  Despite the emotional and physical challenges, the patient appeared to be actively  seeking ways to improve her situation, including securing her own apartment, seeking a new primary care provider, and utilizing motivational and spiritual videos for emotional support.  Visit Diagnosis:    ICD-10-CM   1. Bipolar disorder, in full remission, most recent episode mixed (HCC)  F31.78 traZODone (DESYREL) 50 MG tablet    OLANZapine (ZYPREXA) 2.5 MG tablet    2. GAD (generalized anxiety disorder)  F41.1 traZODone (DESYREL) 50 MG tablet    escitalopram (LEXAPRO) 20 MG tablet    hydrOXYzine (ATARAX) 25 MG tablet    3. Insomnia due to medical condition  G47.01 traZODone (DESYREL) 50 MG tablet    OLANZapine (ZYPREXA) 2.5 MG tablet   mood, pain    4. Cocaine use disorder, moderate, in early remission (HCC)  F14.21    Currently sober      Past Psychiatric History: I have reviewed past psychiatric history from progress note on 04/06/2019.  Past trials of medications like Seroquel, Trileptal, Prozac, Lexapro, Xanax, Klonopin, trazodone.  Past Medical History:  Past Medical History:  Diagnosis Date   Anxiety    Bipolar 1 disorder (HCC)    Depression    Hypertensive disorder 02/09/2019   Neuromuscular disorder [facility])    CERVICAL DDD    Past Surgical History:  Procedure Laterality Date   ABDOMINAL HYSTERECTOMY     BONE EXCISION Right 12/15/2018   Procedure: PARTIAL EXCISION BONE-PHALANX RIGHT WITH FLUOROSCOPY;  Surgeon: Rosetta Posner, DPM;  Location: [facility];  Service: Podiatry;  Laterality: Right;  MAC ANESTHESIA   BREAST SURGERY     ECTOPIC PREGNANCY SURGERY     FOOT SURGERY     HAND TENDON SURGERY  KNEE SURGERY     LIPOMA EXCISION Left 09/25/2020   Procedure: EXCISION LIPOMAS;  Surgeon: Earline Mayotte, MD;  Location: [facility];  Service: General;  Laterality: Left;  arm   OVARIAN CYST REMOVAL      Family Psychiatric History: I have reviewed family psychiatric history from progress note on 04/06/2019.  Family History:  Family History  Problem Relation  Age of Onset   Diabetes Mother    Alcohol abuse Father    Colon cancer Father    Alcohol abuse Brother    Hypertension Brother     Social History: I have reviewed social history from progress note on 04/06/2019. Social History   Socioeconomic History   Marital status: Married    Spouse name: darwin Milroy   Number of children: 3   Years of education: Not on file   Highest education level: Some college, no degree  Occupational History   Not on file  Tobacco Use   Smoking status: Former    Types: Cigarettes   Smokeless tobacco: Never  Vaping Use   Vaping status: Never Used  Substance and Sexual Activity   Alcohol use: Yes    Comment: OCCAS   Drug use: Never   Sexual activity: Not Currently  Other Topics Concern   Not on file  Social History Narrative   Not on file   Social Determinants of Health   Financial Resource Strain: Not on File (11/05/2022)   Received from [employer]    Financial Resource Strain: 0  Food Insecurity: Not on File (12/12/2022)   Received from [facility] Insecurity    Food: 0  Transportation Needs: Not at Risk (12/02/2022)   Received from [employer] Needs    Transportation: 1  Physical Activity: Not on File (11/05/2022)   Received from [facility]   Physical Activity    Physical Activity: 0  Stress: Not on File (11/05/2022)   Received from [facility]   Stress    Stress: 0  Social Connections: Not on File (12/02/2022)   Received from [facility]   Social Connections    Connectedness: 0    Allergies:  Allergies  Allergen Reactions   Phentermine Other (See Comments)    Vivid dreams/DRUG INTERACTION NOT ALLERGIC Vivid dreams/DRUG INTERACTION NOT ALLERGIC    Quinolones Nausea Only and Other (See Comments)   Macrobid [Nitrofurantoin Macrocrystal] Rash   Septra [Sulfamethoxazole-Trimethoprim] Rash    Metabolic Disorder Labs: Lab Results  Component Value Date   HGBA1C 5.4 04/17/2022   MPG 108.28 04/17/2022   MPG  114 09/08/2019   Lab Results  Component Value Date   PROLACTIN 12.7 04/17/2022   Lab Results  Component Value Date   CHOL 201 (H) 04/17/2022   TRIG 70 04/17/2022   HDL 64 04/17/2022   CHOLHDL 3.1 04/17/2022   VLDL 14 04/17/2022   LDLCALC 123 (H) 04/17/2022   LDLCALC 103 (H) 09/08/2019   Lab Results  Component Value Date   TSH 1.988 04/17/2022   TSH 0.704 09/08/2019    Therapeutic Level Labs: No results found for: "LITHIUM" No results found for: "VALPROATE" No results found for: "CBMZ"  Current Medications: Current Outpatient Medications  Medication Sig Dispense Refill   escitalopram (LEXAPRO) 20 MG tablet Take 1 tablet (20 mg total) by mouth daily. 90 tablet 1   gabapentin (NEURONTIN) 100 MG capsule Take 300 mg by mouth 3 (three) times daily.     naloxone (NARCAN) 4 MG/0.1ML LIQD nasal  spray kit Place 1 spray into the nose once.     promethazine (PHENERGAN) 25 MG tablet Take 1 tablet by mouth every 6 (six) hours as needed.     vitamin B-12 (CYANOCOBALAMIN) 100 MCG tablet Take 100 mcg by mouth daily.     hydrOXYzine (ATARAX) 25 MG tablet Take 1 tablet (25 mg total) by mouth 3 (three) times daily. 90 tablet 2   OLANZapine (ZYPREXA) 2.5 MG tablet Take 1 tablet (2.5 mg total) by mouth at bedtime. 90 tablet 0   traZODone (DESYREL) 50 MG tablet Take 1.5-2 tablets (75-100 mg total) by mouth at bedtime. 60 tablet 1   No current facility-administered medications for this visit.     Musculoskeletal: Strength & Muscle Tone: within normal limits Gait & Station: normal Patient leans: N/A  Psychiatric Specialty Exam: Review of Systems  Psychiatric/Behavioral:  Positive for sleep disturbance. The patient is nervous/anxious.        Grief    Blood pressure (!) 150/77, pulse 79, temperature 98.3 F (36.8 C), temperature source Skin, height 5\' 7"  (1.702 m), weight 209 lb 3.2 oz (94.9 kg).Body mass index is 32.77 kg/m.  General Appearance: Casual  Eye Contact:  Fair  Speech:   Clear and Coherent  Volume:  Normal  Mood:   grieving the loss of relationship,anxiety  Affect:  Congruent  Thought Process:  Goal Directed and Descriptions of Associations: Intact  Orientation:  Full (Time, Place, and Person)  Thought Content: Logical   Suicidal Thoughts:  No  Homicidal Thoughts:  No  Memory:  Immediate;   Fair Recent;   Fair Remote;   Fair  Judgement:  Fair  Insight:  Fair  Psychomotor Activity:  Normal  Concentration:  Concentration: Fair and Attention Span: Fair  Recall:  [employer] of Knowledge: Fair  Language: Fair  Akathisia:  No  Handed:  Right  AIMS (if indicated): done  Assets:  Communication Skills Desire for Improvement Housing Social Support  ADL's:  Intact  Cognition: WNL  Sleep:  Poor   Screenings: Geneticist, molecular Office Visit from 02/11/2023 in [facility] Office Visit from 04/17/2022 in [facility] Video Visit from 09/11/2021 in [facility] Video Visit from 07/17/2021 in [facility] Video Visit from 12/06/2020 in [facility]  AIMS Total Score 0 0 0 0 3      GAD-7    Flowsheet Row Office Visit from 02/11/2023 in [facility] Office Visit from 04/17/2022 in [facility] Video Visit from 09/11/2021 in [facility] Video Visit from 07/12/2020 in [facility]  Total GAD-7 Score 4 1 0 7      PHQ2-9    Flowsheet Row Office Visit from 02/11/2023 in [facility] Office Visit from 04/17/2022 in [facility] Video Visit from 09/11/2021 in [facility] Video Visit from  07/17/2021 in [facility] Video Visit from 03/06/2021 in [facility]  PHQ-2 Total Score 1 2 0 0 1  PHQ-9 Total Score -- 4 -- -- --      Flowsheet Row Office Visit from 02/11/2023 in [facility] Video Visit from 11/07/2022 in [facility] ED from  09/01/2022 in [facility]  C-SSRS RISK CATEGORY No Risk No Risk No Risk        Assessment and Plan: Chyrell Lightner is a 62 year old African-American female, married, on disability, lives in [place], has a history of bipolar disorder, GAD, chronic back pain, IBS was evaluated in office today.  Patient currently grieving the loss of her relationship with her spouse as well as sleep problems mostly due to her work schedule as well as pain, will continue to benefit from medication management, plan as noted below.  Plan  Bipolar disorder type I most recent episode mixed in remission. Ongoing grief and sadness related to separation from husband. No manic episodes, hallucinations, paranoia, or suicidal ideation.  Current medications:  Gabapentin 300 mg TID-prescribed for pain. Olanzapine 2.5 mg nightly. Explained risks of long-term olanzapine use, including potential for increased blood pressure, cholesterol, blood sugar, and weight gain.  Generalized anxiety disorder-improving Lexapro 20 mg p.o. daily, patient is currently taking a higher dosage. Continue gabapentin as prescribed prescribed for pain although it is beneficial for anxiety   Insomnia-unstable  Discussed increasing trazodone to 75 MG - 100 mg nightly and adjusting Gabapentin dosing to combine midday and nighttime doses for better sleep. Potential benefits of melatonin combination supplement if sleep issues persist.  - Increase trazodone to 100 mg nightly - Adjust gabapentin dosing to combine midday  and nighttime doses for better sleep - Consider melatonin combination supplement if sleep issues persist.  Cocaine use disorder in remission Patient currently sober.  Will consider getting a urine drug screen as needed.    Blood pressure recorded at 155/77, consistent with previous readings. No new medications or changes in management discussed. - Monitor blood pressure - Recheck blood pressure at next visit -Patient advised to follow up with primary care provider or go to the nearest urgent care if blood pressure persistently stays high.  Patient currently taking Ginkgo biloba.  Ginkgo biloba has potential interaction with trazodone, leading to over sedation requiring medical intervention in some cases. - Discontinue ginkgo biloba due to interaction with trazodone   Follow-up - Schedule follow-up appointment in 3 months - Monitor and adjust treatment as needed based on patient feedback.  Collaboration of Care: Collaboration of Care: Primary Care Provider AEB patient encouraged to follow up with primary care for elevated blood pressure reading as well as for pain management.  Patient/Guardian was advised Release of Information must be obtained prior to any record release in order to collaborate their care with an outside provider. Patient/Guardian was advised if they have not already done so to contact the registration department to sign all necessary forms in order for [place] to release information regarding their care.   Consent: Patient/Guardian gives verbal consent for treatment and assignment of benefits for services provided during this visit. Patient/Guardian expressed understanding and agreed to proceed.   This note was generated in part or whole with voice recognition software. Voice recognition is usually quite accurate but there are transcription errors that can and very often do occur. I apologize for any typographical errors that were not detected and corrected.    Jomarie Longs, MD 02/12/2023, 8:09 AM

## 2023-03-14 ENCOUNTER — Other Ambulatory Visit: Payer: Self-pay | Admitting: Psychiatry

## 2023-03-14 DIAGNOSIS — F3178 Bipolar disorder, in full remission, most recent episode mixed: Secondary | ICD-10-CM

## 2023-03-14 DIAGNOSIS — G4701 Insomnia due to medical condition: Secondary | ICD-10-CM

## 2023-05-15 ENCOUNTER — Telehealth (INDEPENDENT_AMBULATORY_CARE_PROVIDER_SITE_OTHER): Payer: Medicare Other | Admitting: Psychiatry

## 2023-05-15 ENCOUNTER — Encounter: Payer: Self-pay | Admitting: Psychiatry

## 2023-05-15 DIAGNOSIS — F3178 Bipolar disorder, in full remission, most recent episode mixed: Secondary | ICD-10-CM | POA: Diagnosis not present

## 2023-05-15 DIAGNOSIS — G4701 Insomnia due to medical condition: Secondary | ICD-10-CM | POA: Diagnosis not present

## 2023-05-15 DIAGNOSIS — F1421 Cocaine dependence, in remission: Secondary | ICD-10-CM | POA: Diagnosis not present

## 2023-05-15 DIAGNOSIS — F411 Generalized anxiety disorder: Secondary | ICD-10-CM

## 2023-05-15 DIAGNOSIS — H25811 Combined forms of age-related cataract, right eye: Secondary | ICD-10-CM | POA: Insufficient documentation

## 2023-05-15 MED ORDER — HYDROXYZINE HCL 25 MG PO TABS: 25.0000 mg | ORAL_TABLET | Freq: Three times a day (TID) | ORAL | 2 refills | Status: DC

## 2023-05-15 MED ORDER — TRAZODONE HCL 100 MG PO TABS: 100.0000 mg | ORAL_TABLET | Freq: Every evening | ORAL | 1 refills | Status: DC | PRN

## 2023-05-15 MED ORDER — OLANZAPINE 2.5 MG PO TABS: 2.5000 mg | ORAL_TABLET | ORAL | Status: DC

## 2023-05-15 NOTE — Progress Notes (Unsigned)
 BH MD OP Progress Note Virtual Visit via Video Note  I connected with Jennifer Welch on 05/15/23 at  3:00 PM EST by a video enabled telemedicine application and verified that I am speaking with the correct person using two identifiers.  Location Provider Location : [facility] Patient Location : Home  Participants: Patient , Provider   I discussed the limitations of evaluation and management by telemedicine and the availability of in person appointments. The patient expressed understanding and agreed to proceed.    I discussed the assessment and treatment plan with the patient. The patient was provided an opportunity to ask questions and all were answered. The patient agreed with the plan and demonstrated an understanding of the instructions.   The patient was advised to call back or seek an in-person evaluation if the symptoms worsen or if the condition fails to improve as anticipated.    05/16/2023 7:57 AM Jennifer Welch  MRN:  [id number]  Chief Complaint:  Chief Complaint  Patient presents with   Follow-up   Depression   Anxiety   Medication Refill   HPI: Jennifer Welch is a 63 year old African-American female, separated, lives in [place], has a history of bipolar disorder, GAD, cocaine use disorder in remission, gastroesophageal reflux disease, IBS, carpal tunnel syndrome was evaluated by telemedicine today.  She has a history of bipolar disorder and generalized anxiety disorder. Current medications include olanzapine 2.5 mg at bedtime and Lexapro 20 mg. She has been experiencing increased appetite and weight gain, which she attributes to her medications. She has been in remission from cocaine use for nine months.  She denies any suicidality, homicidality or perceptual disturbances.  She reports improvement in sleep since her trazodone dosage was increased to 100 mg at bedtime. She is also taking topiramate 25 mg daily to help with appetite control.  She has been diagnosed with  high blood pressure and is currently on an antihypertensive.  She reports significant weight gain, reaching 218 pounds, which caused emotional distress. She has been working on [employer] management independently and has noticed some changes.  She experiences pain due to bone spurs in her knees, neck, and back, as well as bulging discs in the neck and back.  She is trying to manage her pain with muscle relaxant.  She recently underwent cataract surgery on her right eye and is scheduled for the left eye surgery next week.  She is currently residing in [place], [place], and has recently moved into an apartment. She works part-time at [employer] and lives close to her daughter.  Visit Diagnosis:    ICD-10-CM   1. Bipolar disorder, in full remission, most recent episode mixed (HCC)  F31.78 OLANZapine (ZYPREXA) 2.5 MG tablet    2. GAD (generalized anxiety disorder)  F41.1 hydrOXYzine (ATARAX) 25 MG tablet    3. Insomnia due to medical condition  G47.01 traZODone (DESYREL) 100 MG tablet   mood , pain    4. Cocaine use disorder, moderate, in early remission (HCC)  F14.21       Past Psychiatric History: I have reviewed past psychiatric history from progress note on 04/06/2019.  Past trials of medications like Seroquel, Trileptal, Prozac, Lexapro, Xanax, Klonopin, trazodone.  Past Medical History:  Past Medical History:  Diagnosis Date   Anxiety    Bipolar 1 disorder (HCC)    Depression    Hypertensive disorder 02/09/2019   Neuromuscular disorder [facility])    CERVICAL DDD    Past Surgical History:  Procedure Laterality Date  ABDOMINAL HYSTERECTOMY     BONE EXCISION Right 12/15/2018   Procedure: PARTIAL EXCISION BONE-PHALANX RIGHT WITH FLUOROSCOPY;  Surgeon: Rosetta Posner, DPM;  Location: [facility];  Service: Podiatry;  Laterality: Right;  MAC ANESTHESIA   BREAST SURGERY     ECTOPIC PREGNANCY SURGERY     FOOT SURGERY     HAND TENDON SURGERY     KNEE SURGERY     LIPOMA  EXCISION Left 09/25/2020   Procedure: EXCISION LIPOMAS;  Surgeon: Earline Mayotte, MD;  Location: [facility];  Service: General;  Laterality: Left;  arm   OVARIAN CYST REMOVAL      Family Psychiatric History: I have reviewed family psychiatric history from progress note on 04/06/2019.  Family History:  Family History  Problem Relation Age of Onset   Diabetes Mother    Alcohol abuse Father    Colon cancer Father    Alcohol abuse Brother    Hypertension Brother     Social History: I have reviewed social history from progress note on 04/06/2019. Social History   Socioeconomic History   Marital status: Married    Spouse name: darwin Petrasek   Number of children: 3   Years of education: Not on file   Highest education level: Some college, no degree  Occupational History   Not on file  Tobacco Use   Smoking status: Former    Types: Cigarettes   Smokeless tobacco: Never  Vaping Use   Vaping status: Never Used  Substance and Sexual Activity   Alcohol use: Yes    Comment: OCCAS   Drug use: Never   Sexual activity: Not Currently  Other Topics Concern   Not on file  Social History Narrative   Not on file   Social Drivers of Health   Financial Resource Strain: Low Risk  (04/09/2023)   Received from [facility]   Overall Financial Resource Strain (CARDIA)    Difficulty of Paying Living Expenses: Not very hard  Recent Concern: Financial Resource Strain - Medium Risk (02/26/2023)   Received from [facility]   Overall Financial Resource Strain (CARDIA)    Difficulty of Paying Living Expenses: Somewhat hard  Food Insecurity: No Food Insecurity (04/09/2023)   Received from [facility]   Hunger Vital Sign    Worried About Running Out of Food in the Last Year: Never true    Ran Out of Food in the Last Year: Never true  Recent Concern: Food Insecurity - Food Insecurity Present (02/26/2023)   Received from [facility]   Hunger Vital Sign    Worried About Running Out of Food  in the Last Year: Sometimes true    Ran Out of Food in the Last Year: Never true  Transportation Needs: No Transportation Needs (04/09/2023)   Received [facility] - Transportation    Lack of Transportation (Medical): No    Lack of Transportation (Non-Medical): No  Physical Activity: Unknown (04/09/2023)   [facility]   Exercise Vital Sign    Days of Exercise per Week: 4 days    Minutes of Exercise per Session: Patient declined  Stress: No Stress Concern Present (05/15/2023)   Received from [facility] - Occupational Stress Questionnaire    Feeling of Stress : Not at all  Recent Concern: Stress - Stress Concern Present (02/26/2023)   Received from [facility] - Occupational Stress Questionnaire  Feeling of Stress : Rather much  Social Connections: Socially Integrated (04/09/2023)   [facility] from [facility]   Social Network    How would you rate your social network (family, work, friends)?: Good participation with social networks    Allergies:  Allergies  Allergen Reactions   Phentermine Other (See Comments)    Vivid dreams/DRUG INTERACTION NOT ALLERGIC Vivid dreams/DRUG INTERACTION NOT ALLERGIC    Quinolones Nausea Only and Other (See Comments)   Macrobid [Nitrofurantoin Macrocrystal] Rash   Septra [Sulfamethoxazole-Trimethoprim] Rash    Metabolic Disorder Labs: Lab Results  Component Value Date   HGBA1C 5.4 04/17/2022   MPG 108.28 04/17/2022   MPG 114 09/08/2019   Lab Results  Component Value Date   PROLACTIN 12.7 04/17/2022   Lab Results  Component Value Date   CHOL 201 (H) 04/17/2022   TRIG 70 04/17/2022   HDL 64 04/17/2022   CHOLHDL 3.1 04/17/2022   VLDL 14 04/17/2022   LDLCALC 123 (H) 04/17/2022   LDLCALC 103 (H) 09/08/2019   Lab Results  Component Value Date   TSH 1.988 04/17/2022   TSH 0.704 09/08/2019    Therapeutic Level  Labs: No results found for: "LITHIUM" No results found for: "VALPROATE" No results found for: "CBMZ"  Current Medications: Current Outpatient Medications  Medication Sig Dispense Refill   amLODipine (NORVASC) 2.5 MG tablet Take by mouth.     topiramate (TOPAMAX) 25 MG tablet Take by mouth.     traZODone (DESYREL) 100 MG tablet Take 1-1.5 tablets (100-150 mg total) by mouth at bedtime as needed for sleep. 45 tablet 1   escitalopram (LEXAPRO) 20 MG tablet Take 1 tablet (20 mg total) by mouth daily. 90 tablet 1   gabapentin (NEURONTIN) 100 MG capsule Take 300 mg by mouth 3 (three) times daily.     hydrOXYzine (ATARAX) 25 MG tablet Take 1 tablet (25 mg total) by mouth 3 (three) times daily. 90 tablet 2   naloxone (NARCAN) 4 MG/0.1ML LIQD nasal spray kit Place 1 spray into the nose once.     OLANZapine (ZYPREXA) 2.5 MG tablet Take 1 tablet (2.5 mg total) by mouth as directed. Take 1 tablet at bedtime as needed for the next 2 weeks and stop     pantoprazole (PROTONIX) 40 MG tablet Take by mouth.     promethazine (PHENERGAN) 25 MG tablet Take 1 tablet by mouth every 6 (six) hours as needed.     tiZANidine (ZANAFLEX) 4 MG tablet Take by mouth.     vitamin B-12 (CYANOCOBALAMIN) 100 MCG tablet Take 100 mcg by mouth daily.     No current facility-administered medications for this visit.     Musculoskeletal: Strength & Muscle Tone:  UTA Gait & Station:  Seated Patient leans: N/A  Psychiatric Specialty Exam: Review of Systems  Psychiatric/Behavioral:  The patient is nervous/anxious.     There were no vitals taken for this visit.There is no height or weight on file to calculate BMI.  General Appearance: Fairly Groomed  Eye Contact:  Fair  Speech:  Clear and Coherent  Volume:  Normal  Mood:  Anxious  Affect:  Congruent  Thought Process:  Goal Directed and Descriptions of Associations: Intact  Orientation:  Full (Time, Place, and Person)  Thought Content: Logical   Suicidal Thoughts:  No   Homicidal Thoughts:  No  Memory:  Immediate;   Fair Recent;   Fair Remote;   Fair  Judgement:  Fair  Insight:  Fair  Psychomotor Activity:  Normal  Concentration:  Concentration: Fair and Attention Span: Fair  Recall:  [employer] of Knowledge: Fair  Language: Fair  Akathisia:  No  Handed:  Right  AIMS (if indicated): not done  Assets:  Desire for Improvement Housing Social Support  ADL's:  Intact  Cognition: WNL  Sleep:  Fair   Screenings: Geneticist, molecular Office Visit from 02/11/2023 in [facility] Office Visit from 04/17/2022 in [facility] Video Visit from 09/11/2021 in [facility] Video Visit from 07/17/2021 in [facility] Video Visit from 12/06/2020 in [facility]  AIMS Total Score 0 0 0 0 3      GAD-7    Flowsheet Row Office Visit from 02/11/2023 in [facility] Office Visit from 04/17/2022 in [facility] Video Visit from 09/11/2021 in [facility] Video Visit from 07/12/2020 in [facility]  Total GAD-7 Score 4 1 0 7      PHQ2-9    Flowsheet Row Office Visit from 02/11/2023 in [facility] Office Visit from 04/17/2022 in [facility] Video Visit from 09/11/2021 in [facility] Video Visit from 07/17/2021 in [facility] Video Visit from 03/06/2021 in [facility]  PHQ-2 Total Score 1 2 0 0 1  PHQ-9 Total Score -- 4 -- -- --      Flowsheet Row Video Visit from 05/15/2023 in [facility] Office Visit from 02/11/2023 in [facility] Video Visit from 11/07/2022 in [facility]  C-SSRS RISK CATEGORY No Risk No Risk No Risk        Assessment and Plan: Jennifer Welch is a 63 year old African-American female, married, on disability, lives in [place], has a history of bipolar disorder, GAD, chronic back pain, IBS was evaluated by telemedicine today.  Discussed assessment and plan as noted below.  Bipolar Disorder in remission. Currently on olanzapine 2.5 mg at bedtime with significant weight gain and increased appetite. Interested in discontinuing olanzapine due to these side effects. Discussed switching to lamotrigine, a mood stabilizer that does not cause weight gain. Also on topiramate and gabapentin, both with mood-stabilizing properties. - Discontinue Olanzapine starting tonight, could use it as needed for the next couple of weeks. - Initiate Lamotrigine if needed - Increase Trazodone to 150 mg at bedtime for sleep  Generalized Anxiety Disorder-improving Currently on Lexapro 20 mg daily.  Reports medications as beneficial. - Continue Lexapro 20 mg daily  Insomnia-improving Improved sleep with trazodone 100 mg at bedtime. Considering discontinuation of olanzapine, agreed to increase trazodone to 150 mg at bedtime to aid sleep. - Increase Trazodone to 150 mg at bedtime  Cocaine use disorder in remission She is currently sober, has been sober since the past 9 months. - Consider getting urine drug screen in future sessions.  Follow-up - Follow-up appointment on April 3rd at 1:20 PM via video.   Consent: Patient/Guardian gives verbal consent for treatment and assignment of benefits for services provided during this visit. Patient/Guardian expressed understanding and agreed to proceed.   Discussed the use of a AI scribe software for clinical note transcription with the patient, who  gave verbal consent to proceed. This note  was generated in part or whole with voice recognition software. Voice recognition is usually quite accurate but there are transcription errors that can and very often do occur. I apologize for any typographical errors that were not detected and corrected.     Jomarie Longs, MD 05/16/2023, 7:57 AM

## 2023-06-07 DIAGNOSIS — M1711 Unilateral primary osteoarthritis, right knee: Secondary | ICD-10-CM | POA: Insufficient documentation

## 2023-06-19 ENCOUNTER — Telehealth: Payer: Medicare Other | Admitting: Psychiatry

## 2023-06-19 ENCOUNTER — Encounter: Payer: Self-pay | Admitting: Psychiatry

## 2023-06-19 DIAGNOSIS — F411 Generalized anxiety disorder: Secondary | ICD-10-CM

## 2023-06-19 DIAGNOSIS — G4701 Insomnia due to medical condition: Secondary | ICD-10-CM | POA: Diagnosis not present

## 2023-06-19 DIAGNOSIS — F1421 Cocaine dependence, in remission: Secondary | ICD-10-CM | POA: Diagnosis not present

## 2023-06-19 DIAGNOSIS — F3131 Bipolar disorder, current episode depressed, mild: Secondary | ICD-10-CM | POA: Diagnosis not present

## 2023-06-19 DIAGNOSIS — R52 Pain, unspecified: Secondary | ICD-10-CM

## 2023-06-19 NOTE — Patient Instructions (Signed)
  [email or web address]  www.[email or web address]  [email or web address] [facility] [address], [place], [place] [address]   [phone]  [facility] [email or web address] [address], [place], [place] [address]  [phone]  [facility] [email or web address] [address], [place], [place] [address]  [phone]   Family solutions - [phone]  [facility] - [phone]  [facility] - [phone] counseling [phone]  [facility] [phone]   [email or web address] this clinician can offer telehealth and has a sliding scale option  [email or web address] this group also offers sliding scale rates and is based out of [place]  Dr. Liborio Nixon with the [facility] specializes in divorce  [employer] [facility] has interns who offer sliding scale rates and some of the full time clinicians do, as well. You complete their contact form on their website and the referrals coordinator will help to get connected to someone   [email or web address] [phone]  Medicaid below :  [facility] [address] [place], [place] [address]  [phone]    [employer] [address] [place], [place] [address]  [phone]    [facility] [address] [place], [place] [address]  [phone]

## 2023-06-19 NOTE — Progress Notes (Signed)
 Virtual Visit via Video Note  I connected with Jennifer Welch on 06/19/23 at  1:20 PM EDT by a video enabled telemedicine application and verified that I am speaking with the correct person using two identifiers.  Location Provider Location : [facility] Patient Location : Car  Participants: Patient , Provider   I discussed the limitations of evaluation and management by telemedicine and the availability of in person appointments. The patient expressed understanding and agreed to proceed.   I discussed the assessment and treatment plan with the patient. The patient was provided an opportunity to ask questions and all were answered. The patient agreed with the plan and demonstrated an understanding of the instructions.   The patient was advised to call back or seek an in-person evaluation if the symptoms worsen or if the condition fails to improve as anticipated.  BH MD OP Progress Note  06/19/2023 7:45 PM Jennifer Welch  MRN:  [id number]  Chief Complaint:  Chief Complaint  Patient presents with   Follow-up   Depression   Anxiety   Medication Refill   HPI: Jennifer Welch is a 63 year old African-American female, separated, lives in [place], has a history of bipolar disorder, GAD, cocaine use disorder in remission, gastroesophageal reflux disease, IBS, carpal tunnel syndrome was evaluated by telemedicine today.  She is experiencing mood instability and is currently tapering off olanzapine due to concerns about weight gain and its effects on hemoglobin A1c and lipid panel. She has been taking olanzapine intermittently, about three to four times a week, and wants to discontinue it completely. Increased tearfulness and thoughts about her husband are noted, which she attributes to recent events involving him.  A history of depression is associated with recent personal events, including her husband's actions. She describes feeling hurt and experiencing episodes of crying. Despite acknowledging  the emotional impact of these events, she is determined to move on, noting that she has a new apartment, a car from her daughter, and a job she enjoys.  She is currently taking Lexapro .  She is also on gabapentin prescribed by her primary provider which is a mood stabilizer although prescribed for pain and would like to continue the same.  She is not interested in addition of another mood stabilizer at this time.  She is agreeable to discussion with her primary care provider regarding increasing the dosage of gabapentin to target her mood symptoms also.   Denies suicidal thoughts, hallucinations, or homicidality.  She reports sleeping well at night. She expresses interest in finding a therapist in [place], as she previously saw a therapist in [place] but had to stop due to relocation.    Visit Diagnosis:    ICD-10-CM   1. Bipolar 1 disorder, depressed, mild (HCC)  F31.31     2. GAD (generalized anxiety disorder)  F41.1     3. Insomnia due to medical condition  G47.01    Mood symptoms, pain    4. Cocaine use disorder, moderate, in early remission (HCC)  F14.21       Past Psychiatric History: I have reviewed past psychiatric history from progress note on 04/06/2019.  Past trials of medications like Seroquel, Trileptal, Prozac, Lexapro, Xanax, Klonopin, trazodone.  Past Medical History:  Past Medical History:  Diagnosis Date   Anxiety    Bipolar 1 disorder (HCC)    Depression    Hypertensive disorder 02/09/2019   Neuromuscular disorder [facility])    CERVICAL DDD    Past Surgical History:  Procedure Laterality Date   ABDOMINAL  HYSTERECTOMY     BONE EXCISION Right 12/15/2018   Procedure: PARTIAL EXCISION BONE-PHALANX RIGHT WITH FLUOROSCOPY;  Surgeon: Rosetta Posner, DPM;  Location: [facility];  Service: Podiatry;  Laterality: Right;  MAC ANESTHESIA   BREAST SURGERY     ECTOPIC PREGNANCY SURGERY     FOOT SURGERY     HAND TENDON SURGERY     KNEE SURGERY     LIPOMA  EXCISION Left 09/25/2020   Procedure: EXCISION LIPOMAS;  Surgeon: Earline Mayotte, MD;  Location: [facility];  Service: General;  Laterality: Left;  arm   OVARIAN CYST REMOVAL      Family Psychiatric History: I have reviewed family psychiatric history from progress note on 04/06/2019.  Family History:  Family History  Problem Relation Age of Onset   Diabetes Mother    Alcohol abuse Father    Colon cancer Father    Alcohol abuse Brother    Hypertension Brother     Social History: I have reviewed social history from progress note on 04/06/2019. Social History   Socioeconomic History   Marital status: Married    Spouse name: darwin Ridge   Number of children: 3   Years of education: Not on file   Highest education level: Some college, no degree  Occupational History   Not on file  Tobacco Use   Smoking status: Former    Types: Cigarettes   Smokeless tobacco: Never  Vaping Use   Vaping status: Never Used  Substance and Sexual Activity   Alcohol use: Yes    Comment: OCCAS   Drug use: Never   Sexual activity: Not Currently  Other Topics Concern   Not on file  Social History Narrative   Not on file   Social Drivers of Health   Financial Resource Strain: Medium Risk (06/11/2023)   Received from [facility]   Overall Financial Resource Strain (CARDIA)    Difficulty of Paying Living Expenses: Somewhat hard  Food Insecurity: No Food Insecurity (06/11/2023)   Received from [facility]   Hunger Vital Sign    Worried About Running Out of Food in the Last Year: Never true    Ran Out of Food in the Last Year: Never true  Transportation Needs: No Transportation Needs (06/11/2023)   Received from [facility] - Transportation    Lack of Transportation (Medical): No    Lack of Transportation (Non-Medical): No  Physical Activity: Insufficiently Active (06/11/2023)   Received from [facility]   Exercise Vital Sign    Days of Exercise per Week: 3 days    Minutes  of Exercise per Session: 30 min  Stress: Stress Concern Present (06/11/2023)   Received from [facility] - Occupational Stress Questionnaire    Feeling of Stress : To some extent  Social Connections: Socially Integrated (04/09/2023)   Received from [facility]   Social Network    How would you rate your social network (family, work, friends)?: Good participation with social networks    Allergies:  Allergies  Allergen Reactions   Phentermine Other (See Comments)    Vivid dreams/DRUG INTERACTION NOT ALLERGIC Vivid dreams/DRUG INTERACTION NOT ALLERGIC    Quinolones Nausea Only and Other (See Comments)   Macrobid [Nitrofurantoin Macrocrystal] Rash   Septra [Sulfamethoxazole-Trimethoprim] Rash    Metabolic Disorder Labs: Lab Results  Component Value Date   HGBA1C 5.4 04/17/2022   MPG 108.28 04/17/2022   MPG 114 09/08/2019   Lab Results  Component Value Date   PROLACTIN 12.7 04/17/2022   Lab Results  Component Value Date   CHOL 201 (H) 04/17/2022   TRIG 70 04/17/2022   HDL 64 04/17/2022   CHOLHDL 3.1 04/17/2022   VLDL 14 04/17/2022   LDLCALC 123 (H) 04/17/2022   LDLCALC 103 (H) 09/08/2019   Lab Results  Component Value Date   TSH 1.988 04/17/2022   TSH 0.704 09/08/2019    Therapeutic Level Labs: No results found for: "LITHIUM" No results found for: "VALPROATE" No results found for: "CBMZ"  Current Medications: Current Outpatient Medications  Medication Sig Dispense Refill   amLODipine (NORVASC) 2.5 MG tablet Take by mouth.     escitalopram (LEXAPRO) 20 MG tablet Take 1 tablet (20 mg total) by mouth daily. 90 tablet 1   gabapentin (NEURONTIN) 100 MG capsule Take 300 mg by mouth 3 (three) times daily.     hydrOXYzine (ATARAX) 25 MG tablet Take 1 tablet (25 mg total) by mouth 3 (three) times daily. 90 tablet 2   naloxone (NARCAN) 4 MG/0.1ML LIQD nasal spray kit Place 1 spray into the nose once.     OLANZapine (ZYPREXA)  2.5 MG tablet Take 1 tablet (2.5 mg total) by mouth as directed. Take 1 tablet at bedtime as needed for the next 2 weeks and stop     pantoprazole (PROTONIX) 40 MG tablet Take by mouth.     promethazine (PHENERGAN) 25 MG tablet Take 1 tablet by mouth every 6 (six) hours as needed.     tiZANidine (ZANAFLEX) 4 MG tablet Take by mouth.     topiramate (TOPAMAX) 25 MG tablet Take by mouth.     traZODone (DESYREL) 100 MG tablet Take 1-1.5 tablets (100-150 mg total) by mouth at bedtime as needed for sleep. 45 tablet 1   vitamin B-12 (CYANOCOBALAMIN) 100 MCG tablet Take 100 mcg by mouth daily.     No current facility-administered medications for this visit.     Musculoskeletal: Strength & Muscle Tone:  UTA Gait & Station:  Seated Patient leans: N/A  Psychiatric Specialty Exam: Review of Systems  Psychiatric/Behavioral:  Positive for dysphoric mood.     There were no vitals taken for this visit.There is no height or weight on file to calculate BMI.  General Appearance: Casual  Eye Contact:  Fair  Speech:  Clear and Coherent  Volume:  Normal  Mood:  Depressed  Affect:  Appropriate  Thought Process:  Goal Directed and Descriptions of Associations: Intact  Orientation:  Full (Time, Place, and Person)  Thought Content: Logical   Suicidal Thoughts:  No  Homicidal Thoughts:  No  Memory:  Immediate;   Fair Recent;   Fair Remote;   Fair  Judgement:  Fair  Insight:  Fair  Psychomotor Activity:  Normal  Concentration:  Concentration: Fair and Attention Span: Fair  Recall:  [employer] of Knowledge: Fair  Language: Fair  Akathisia:  No  Handed:  Right  AIMS (if indicated): not done  Assets:  Desire for Improvement Housing Social Support Transportation  ADL's:  Intact  Cognition: WNL  Sleep:  Fair   Screenings: Geneticist, molecular Office Visit from 02/11/2023 in [facility] Office Visit from 04/17/2022 in [facility] Video Visit from 09/11/2021 in [facility] Video Visit from 07/17/2021 in [facility] Video Visit from 12/06/2020 in [facility]  AIMS  Total Score 0 0 0 0 3      GAD-7    Flowsheet Row Office Visit from 02/11/2023 in [facility] Office Visit from 04/17/2022 in [facility] Video Visit from 09/11/2021 in [facility] Video Visit from 07/12/2020 in [facility]  Total GAD-7 Score 4 1 0 7      PHQ2-9    Flowsheet Row Office Visit from 02/11/2023 in [facility] Office Visit from 04/17/2022 in [facility] Video Visit from 09/11/2021 in [facility] Video Visit from 07/17/2021 in [facility] Video Visit from 03/06/2021 in [facility]  PHQ-2 Total Score 1 2 0 0 1  PHQ-9 Total Score -- 4 -- -- --      Flowsheet Row Video Visit from 06/19/2023 in [facility] Video Visit from 05/15/2023 in [facility] Office Visit from 02/11/2023 in [facility]  C-SSRS RISK CATEGORY No Risk No Risk No Risk        Assessment and Plan: Jennifer Welch is a 63 year old African-American female, married, disability, lives in [place], has a history of bipolar disorder, GAD, chronic back pain, IBS was evaluated by telemedicine today.  Discussed assessment and plan as noted below.  Bipolar Disorder most recent episode depressed likely mild-unstable Experiencing depressive symptoms likely related to bipolar  disorder and emotional distress from husband's actions. Tapering off olanzapine due to weight gain, hyperglycemia, hyperlipidemia concerns, and tardive dyskinesia risk. Currently on gabapentin and topiramate, both with mood-stabilizing properties. Discussed need for mood stabilizer as SSRI alone is insufficient for bipolar disorder. She prefers not to restart lamotrigine due to past experience.   - Continue tapering off Olanzapine at her own pace.   - Consider increasing Gabapentin dosage to 400 mg three times a day if needed, pending approval from the current prescriber.   - Discuss with the current prescriber about using Gabapentin as a mood stabilizer.   - Consider increasing Topiramate dosage if needed, pending approval from the current prescriber.   - Provide information on local therapists in [place] for additional support.   - Patient is not interested in trial of Lamictal. - Continue Trazodone 150 mg at bedtime for sleep  Generalized anxiety disorder-improving Currently denies any significant anxiety symptoms. - Continue Lexapro 20 mg daily - Continue Hydroxyzine 25 mg 3 times a day as needed  Insomnia-improving Sleep is currently overall okay on the current medication regimen. - Continue Trazodone 150 mg at bedtime  Cocaine use disorder in remission Patient reports she continues to be sober. - Will consider urine drug screen in the future.  Follow-up - Follow-up in clinic in 4 to 5 weeks or sooner if needed.    Collaboration of Care: Collaboration of Care: Referral or follow-up with counselor/therapist AEB I have communicated with staff at [facility], patient to be scheduled with therapist at that location.  Patient/Guardian was advised Release of Information must be obtained prior to any record release in order to collaborate their care with an outside provider. Patient/Guardian was advised if they have not already done so to contact the registration  department to sign all necessary forms in order for [place] to release information regarding their care.   Consent: Patient/Guardian gives verbal consent for treatment and assignment of  benefits for services provided during this visit. Patient/Guardian expressed understanding and agreed to proceed.  Discussed the use of a AI scribe software for clinical note transcription with the patient, who gave verbal consent to proceed.  This note was generated in part or whole with voice recognition software. Voice recognition is usually quite accurate but there are transcription errors that can and very often do occur. I apologize for any typographical errors that were not detected and corrected.     Jomarie Longs, MD 06/19/2023, 7:45 PM

## 2023-07-15 ENCOUNTER — Ambulatory Visit (HOSPITAL_COMMUNITY): Admitting: Licensed Clinical Social Worker

## 2023-07-28 ENCOUNTER — Encounter: Payer: Self-pay | Admitting: Psychiatry

## 2023-07-28 ENCOUNTER — Telehealth (INDEPENDENT_AMBULATORY_CARE_PROVIDER_SITE_OTHER): Admitting: Psychiatry

## 2023-07-28 DIAGNOSIS — F1421 Cocaine dependence, in remission: Secondary | ICD-10-CM

## 2023-07-28 DIAGNOSIS — R52 Pain, unspecified: Secondary | ICD-10-CM

## 2023-07-28 DIAGNOSIS — G4701 Insomnia due to medical condition: Secondary | ICD-10-CM

## 2023-07-28 DIAGNOSIS — F411 Generalized anxiety disorder: Secondary | ICD-10-CM | POA: Diagnosis not present

## 2023-07-28 DIAGNOSIS — F3176 Bipolar disorder, in full remission, most recent episode depressed: Secondary | ICD-10-CM | POA: Diagnosis not present

## 2023-07-28 MED ORDER — ESCITALOPRAM OXALATE 20 MG PO TABS: 20.0000 mg | ORAL_TABLET | Freq: Every day | ORAL | 0 refills | Status: AC

## 2023-07-28 MED ORDER — HYDROXYZINE HCL 25 MG PO TABS: 25.0000 mg | ORAL_TABLET | Freq: Three times a day (TID) | ORAL | 0 refills | Status: AC

## 2023-07-28 MED ORDER — TRAZODONE HCL 100 MG PO TABS: 100.0000 mg | ORAL_TABLET | Freq: Every evening | ORAL | 0 refills | Status: AC | PRN

## 2023-07-28 NOTE — Progress Notes (Signed)
 Virtual Visit via Video Note  I connected with Jennifer Welch on 07/28/23 at  1:40 PM EDT by a video enabled telemedicine application and verified that I am speaking with the correct person using two identifiers.  Location Provider Location : [facility] Patient Location : Home  Participants: Patient , Provider    I discussed the limitations of evaluation and management by telemedicine and the availability of in person appointments. The patient expressed understanding and agreed to proceed.   I discussed the assessment and treatment plan with the patient. The patient was provided an opportunity to ask questions and all were answered. The patient agreed with the plan and demonstrated an understanding of the instructions.   The patient was advised to call back or seek an in-person evaluation if the symptoms worsen or if the condition fails to improve as anticipated.   BH MD OP Progress Note  07/28/2023 1:59 PM Jennifer Welch  MRN:  [id number]  Chief Complaint:  Chief Complaint  Patient presents with   Follow-up   Depression   Anxiety   Medication Refill   Discussed the use of AI scribe software for clinical note transcription with the patient, who gave verbal consent to proceed.  History of Present Illness Jennifer Welch is a 63 year old African-American female, separated, lives in [place], has a history of bipolar disorder, GAD, cocaine use disorder in remission, gastroesophageal reflux disease, IBS, carpal tunnel syndrome was evaluated by telemedicine today.  She experiences ongoing knee pain, which has worsened following a gel injection that resulted in a gel flare. Her knee is swollen, necessitating the use of a cane and brace. She has been unable to work for the past three weeks due to this issue. Recently, fluid was aspirated from her knee, significantly reducing the swelling.  She is scheduled for total knee replacement surgery in approximately three months. She anticipates  being out of work for an additional three months post-surgery for recovery.  She denies any significant depression, manic or hypomanic symptoms.  She is anxious about her upcoming surgery although her anxiety symptoms are managed on the medications as prescribed.  She takes Lexapro  regularly and denies side effects.  She has been prescribed pain medication, which aids her sleep. She is currently taking gabapentin 400 mg three times a day, which was increased about a week ago. She also takes trazodone  at night and hydroxyzine  as needed. She has recently stopped taking olanzapine  and reports no changes in her mood or sleep since discontinuing it.  She is supported by her daughters and receives a disability check, which helps her financially during this period of unemployment.  Denies thoughts of self-harm, harm to others, or hallucinations.    Visit Diagnosis:    ICD-10-CM   1. Bipolar disorder, in full remission, most recent episode depressed (HCC)  F31.76    Type I    2. GAD (generalized anxiety disorder)  F41.1 escitalopram  (LEXAPRO ) 20 MG tablet    hydrOXYzine  (ATARAX ) 25 MG tablet    3. Insomnia due to medical condition  G47.01 traZODone  (DESYREL ) 100 MG tablet   mood , pain    4. Cocaine use disorder, moderate, in early remission (HCC)  F14.21       Past Psychiatric History: I have reviewed past psychiatric history from progress note on 04/06/2019.  Past trials of medications like Seroquel , Trileptal , Prozac, Lexapro , Xanax, Klonopin , trazodone .  Past Medical History:  Past Medical History:  Diagnosis Date   Anxiety    Bipolar 1 disorder (  HCC)    Depression    Hypertensive disorder 02/09/2019   Neuromuscular disorder [facility])    CERVICAL DDD    Past Surgical History:  Procedure Laterality Date   ABDOMINAL HYSTERECTOMY     BONE EXCISION Right 12/15/2018   Procedure: PARTIAL EXCISION BONE-PHALANX RIGHT WITH FLUOROSCOPY;  Surgeon: Pink Bridges, DPM;  Location: [facility];  Service: Podiatry;  Laterality: Right;  MAC ANESTHESIA   BREAST SURGERY     ECTOPIC PREGNANCY SURGERY     FOOT SURGERY     HAND TENDON SURGERY     KNEE SURGERY     LIPOMA EXCISION Left 09/25/2020   Procedure: EXCISION LIPOMAS;  Surgeon: Marshall Skeeter, MD;  Location: [facility] ORS;  Service: General;  Laterality: Left;  arm   OVARIAN CYST REMOVAL      Family Psychiatric History: I have reviewed family psychiatric history from progress note on 04/06/2019.  Family History:  Family History  Problem Relation Age of Onset   Diabetes Mother    Alcohol abuse Father    Colon cancer Father    Alcohol abuse Brother    Hypertension Brother     Social History: I have reviewed social history from progress note on 04/06/2019. Social History   Socioeconomic History   Marital status: Married    Spouse name: darwin Swails   Number of children: 3   Years of education: Not on file   Highest education level: Some college, no degree  Occupational History   Not on file  Tobacco Use   Smoking status: Former    Types: Cigarettes   Smokeless tobacco: Never  Vaping Use   Vaping status: Never Used  Substance and Sexual Activity   Alcohol use: Yes    Comment: OCCAS   Drug use: Never   Sexual activity: Not Currently  Other Topics Concern   Not on file  Social History Narrative   Not on file   Social Drivers of Health   Financial Resource Strain: Medium Risk (07/14/2023)   Received from [facility]   Overall Financial Resource Strain (CARDIA)    Difficulty of Paying Living Expenses: Somewhat hard  Food Insecurity: No Food Insecurity (07/14/2023)   Received from [facility]   Hunger Vital Sign    Worried About Running Out of Food in the Last Year: Never true    Ran Out of Food in the Last Year: Never true  Transportation Needs: Unmet Transportation Needs (07/14/2023)   Received from [facility] - Transportation    Lack of Transportation (Medical): Yes    Lack of  Transportation (Non-Medical): Yes  Physical Activity: Insufficiently Active (06/11/2023)   Received from [facility]   Exercise Vital Sign    Days of Exercise per Week: 3 days    Minutes of Exercise per Session: 30 min  Stress: No Stress Concern Present (07/14/2023)   Received from [facility] of Occupational Health - Occupational Stress Questionnaire    Feeling of Stress : Only a little  Recent Concern: Stress - Stress Concern Present (06/11/2023)   Received from [facility] of Occupational Health - Occupational Stress Questionnaire    Feeling of Stress : To some extent  Social Connections: Socially Integrated (07/14/2023)   Received from [facility]   Social Network    How would you rate your social network (family, work, friends)?: Good participation with social networks    Allergies:  Allergies  Allergen Reactions  Phentermine Other (See Comments)    Vivid dreams/DRUG INTERACTION NOT ALLERGIC Vivid dreams/DRUG INTERACTION NOT ALLERGIC    Quinolones Nausea Only and Other (See Comments)   Macrobid [Nitrofurantoin Macrocrystal] Rash   Septra  [Sulfamethoxazole -Trimethoprim ] Rash    Metabolic Disorder Labs: Lab Results  Component Value Date   HGBA1C 5.4 04/17/2022   MPG 108.28 04/17/2022   MPG 114 09/08/2019   Lab Results  Component Value Date   PROLACTIN 12.7 04/17/2022   Lab Results  Component Value Date   CHOL 201 (H) 04/17/2022   TRIG 70 04/17/2022   HDL 64 04/17/2022   CHOLHDL 3.1 04/17/2022   VLDL 14 04/17/2022   LDLCALC 123 (H) 04/17/2022   LDLCALC 103 (H) 09/08/2019   Lab Results  Component Value Date   TSH 1.988 04/17/2022   TSH 0.704 09/08/2019    Therapeutic Level Labs: No results found for: "LITHIUM" No results found for: "VALPROATE" No results found for: "CBMZ"  Current Medications: Current Outpatient Medications  Medication Sig Dispense Refill   diclofenac Sodium (VOLTAREN) 1 % GEL Voltaren  (DAW: brand only) : 4 g for each knee, ankle, or foot up to 4 times a day as needed.  Do not apply more than 16 g daily to any one affected joint of the lower extremities.  Do not apply more than 8 g daily to any one affected joint of the upper extremities.  Do not exceed a total dose of 32 g/day total.  Do not use on more than 2 body areas at the same time.     HYDROcodone -acetaminophen  (NORCO) 10-325 MG tablet Take 1 tablet by mouth every 6 (six) hours as needed.     HYDROcodone -acetaminophen  (NORCO/VICODIN) 5-325 MG tablet Take 1 tablet by mouth every 6 (six) hours as needed.     Hylan G-F 20 48 MG/6ML SOSY Inject 48 mg into the articular space.     amLODipine (NORVASC) 2.5 MG tablet Take by mouth.     escitalopram  (LEXAPRO ) 20 MG tablet Take 1 tablet (20 mg total) by mouth daily. 90 tablet 0   gabapentin (NEURONTIN) 100 MG capsule Take 300 mg by mouth 3 (three) times daily.     hydrOXYzine  (ATARAX ) 25 MG tablet Take 1 tablet (25 mg total) by mouth 3 (three) times daily. 90 tablet 0   naloxone (NARCAN) 4 MG/0.1ML LIQD nasal spray kit Place 1 spray into the nose once.     pantoprazole (PROTONIX) 40 MG tablet Take by mouth.     promethazine (PHENERGAN) 25 MG tablet Take 1 tablet by mouth every 6 (six) hours as needed.     tiZANidine (ZANAFLEX) 4 MG tablet Take by mouth.     topiramate (TOPAMAX) 25 MG tablet Take by mouth.     traZODone  (DESYREL ) 100 MG tablet Take 1-1.5 tablets (100-150 mg total) by mouth at bedtime as needed for sleep. 135 tablet 0   vitamin B-12 (CYANOCOBALAMIN) 100 MCG tablet Take 100 mcg by mouth daily.     No current facility-administered medications for this visit.     Musculoskeletal: Strength & Muscle Tone: UTA Gait & Station: Seated Patient leans: N/A  Psychiatric Specialty Exam: Review of Systems  Psychiatric/Behavioral:  The patient is nervous/anxious.     There were no vitals taken for this visit.There is no height or weight on file to calculate BMI.   General Appearance: Casual  Eye Contact:  Fair  Speech:  Normal Rate  Volume:  Normal  Mood:  Anxious  Affect:  Congruent  Thought Process:  Goal Directed and Descriptions of Associations: Intact  Orientation:  Full (Time, Place, and Person)  Thought Content: Logical   Suicidal Thoughts:  No  Homicidal Thoughts:  No  Memory:  Immediate;   Fair Recent;   Fair Remote;   Fair  Judgement:  Fair  Insight:  Fair  Psychomotor Activity:  Normal  Concentration:  Concentration: Fair and Attention Span: Fair  Recall:  [employer] of Knowledge: Fair  Language: Fair  Akathisia:  No  Handed:  Right  AIMS (if indicated): not done  Assets:  Desire for Improvement Housing Social Support Transportation  ADL's:  Intact  Cognition: WNL  Sleep:  improving    Screenings: Geneticist, molecular Office Visit from 02/11/2023 in [facility] Office Visit from 04/17/2022 in [facility] Video Visit from 09/11/2021 in [facility] Video Visit from 07/17/2021 in [facility] Video Visit from 12/06/2020 in [facility]  AIMS Total Score 0 0 0 0 3      GAD-7    Flowsheet Row Office Visit from 02/11/2023 in [facility] Office Visit from 04/17/2022 in [facility] Video Visit from 09/11/2021 in [facility] Video Visit from 07/12/2020 in [facility]  Total GAD-7 Score 4 1 0 7      PHQ2-9    Flowsheet Row Office Visit from 02/11/2023 in [facility] Office Visit from 04/17/2022 in [facility] Video Visit from 09/11/2021 in [facility]  Video Visit from 07/17/2021 in [facility] Video Visit from 03/06/2021 in [facility]  PHQ-2 Total Score 1 2 0 0 1  PHQ-9 Total Score -- 4 -- -- --      Flowsheet Row Video Visit from 07/28/2023 in [facility] Video Visit from 06/19/2023 in [facility] Video Visit from 05/15/2023 in [facility]  C-SSRS RISK CATEGORY No Risk No Risk No Risk        Assessment and Plan: Jennifer Welch is a 63 year old African-American female, married, disabled, lives in [place], presented for a follow-up appointment, discussed assessment and plan as noted below.  Bipolar disorder most recent episode depressed mild in remission Currently denies any significant mood swings including depression symptoms.  Was able to successfully taper off olanzapine  and has not noticed any worsening mood symptoms or sleep issues since then.  She is currently on a higher dosage of gabapentin prescribed by her pain provider.  That has been beneficial with her mood and sleep as well. - Continue Gabapentin 400 mg 3 times a day prescribed by pain provider. - Continue Trazodone  150 mg at bedtime. - Discontinue Olanzapine , tapered off.  Generalized anxiety disorder-improving Currently he does report anxiety mostly related to pain as well as upcoming surgery.  Reports good response to Lexapro  and hydroxyzine  as needed. - Continue Lexapro  20 mg daily - Continue Hydroxyzine  25 mg 3 times a day as needed  Insomnia-improving Sleep problems currently due to pain although improving on the higher dosage of gabapentin and addition of opioid pain medication. - Continue Trazodone  150 mg at bedtime  Cocaine use disorder in remission Currently denies any active substance  use. - Reevaluate in future sessions.   Follow-up - Follow-up in  clinic as needed. - Patient interested in transferring To [facility] since it is closer to her home.  Patient to sign an ROI to obtain medical records to assist with transition and continued care of care.   Collaboration of Care: Collaboration of Care: Primary Care Provider AEB patient encouraged to follow up with primary care provider.  Patient/Guardian was advised Release of Information must be obtained prior to any record release in order to collaborate their care with an outside provider. Patient/Guardian was advised if they have not already done so to contact the registration department to sign all necessary forms in order for us  to release information regarding their care.   Consent: Patient/Guardian gives verbal consent for treatment and assignment of benefits for services provided during this visit. Patient/Guardian expressed understanding and agreed to proceed.   This note was generated in part or whole with voice recognition software. Voice recognition is usually quite accurate but there are transcription errors that can and very often do occur. I apologize for any typographical errors that were not detected and corrected.    Aslan Montagna, MD 07/28/2023, 1:59 PM

## 2023-08-05 ENCOUNTER — Ambulatory Visit (HOSPITAL_COMMUNITY): Admitting: Licensed Clinical Social Worker
